# Patient Record
Sex: Female | Born: 1970 | Race: White | Hispanic: No | Smoking: Former smoker
Health system: Southern US, Community
[De-identification: ages and names within clinical notes are randomized; demographics above are authoritative.]

## PROBLEM LIST (undated history)

## (undated) DIAGNOSIS — C3491 Malignant neoplasm of unspecified part of right bronchus or lung: Secondary | ICD-10-CM

## (undated) DIAGNOSIS — Z923 Personal history of irradiation: Secondary | ICD-10-CM

## (undated) HISTORY — PX: FEMUR CLOSED REDUCTION: SHX939

---

## 2006-06-30 ENCOUNTER — Emergency Department (HOSPITAL_COMMUNITY): Admission: EM | Admit: 2006-06-30 | Discharge: 2006-06-30 | Payer: Self-pay | Admitting: Emergency Medicine

## 2006-07-07 ENCOUNTER — Ambulatory Visit: Payer: Self-pay | Admitting: Obstetrics & Gynecology

## 2014-06-28 ENCOUNTER — Emergency Department (HOSPITAL_COMMUNITY): Payer: Medicaid Other

## 2014-06-28 ENCOUNTER — Encounter (HOSPITAL_COMMUNITY): Payer: Self-pay

## 2014-06-28 ENCOUNTER — Other Ambulatory Visit (HOSPITAL_COMMUNITY): Payer: Self-pay

## 2014-06-28 ENCOUNTER — Inpatient Hospital Stay (HOSPITAL_COMMUNITY)
Admission: EM | Admit: 2014-06-28 | Discharge: 2014-07-03 | DRG: 180 | Disposition: A | Discharge status: Home / Self Care | Payer: Medicaid Other | Attending: Internal Medicine | Admitting: Internal Medicine

## 2014-06-28 DIAGNOSIS — Z8249 Family history of ischemic heart disease and other diseases of the circulatory system: Secondary | ICD-10-CM

## 2014-06-28 DIAGNOSIS — J95811 Postprocedural pneumothorax: Secondary | ICD-10-CM

## 2014-06-28 DIAGNOSIS — C3411 Malignant neoplasm of upper lobe, right bronchus or lung: Secondary | ICD-10-CM | POA: Diagnosis present

## 2014-06-28 DIAGNOSIS — R918 Other nonspecific abnormal finding of lung field: Secondary | ICD-10-CM

## 2014-06-28 DIAGNOSIS — R0789 Other chest pain: Secondary | ICD-10-CM

## 2014-06-28 DIAGNOSIS — R042 Hemoptysis: Secondary | ICD-10-CM | POA: Diagnosis present

## 2014-06-28 DIAGNOSIS — R0602 Shortness of breath: Secondary | ICD-10-CM

## 2014-06-28 DIAGNOSIS — R071 Chest pain on breathing: Secondary | ICD-10-CM | POA: Diagnosis not present

## 2014-06-28 DIAGNOSIS — F17210 Nicotine dependence, cigarettes, uncomplicated: Secondary | ICD-10-CM | POA: Diagnosis present

## 2014-06-28 DIAGNOSIS — Z720 Tobacco use: Secondary | ICD-10-CM | POA: Diagnosis present

## 2014-06-28 DIAGNOSIS — Z7982 Long term (current) use of aspirin: Secondary | ICD-10-CM | POA: Diagnosis not present

## 2014-06-28 DIAGNOSIS — D649 Anemia, unspecified: Secondary | ICD-10-CM | POA: Diagnosis not present

## 2014-06-28 DIAGNOSIS — J181 Lobar pneumonia, unspecified organism: Secondary | ICD-10-CM | POA: Diagnosis not present

## 2014-06-28 DIAGNOSIS — D75839 Thrombocytosis, unspecified: Secondary | ICD-10-CM | POA: Diagnosis present

## 2014-06-28 DIAGNOSIS — R61 Generalized hyperhidrosis: Secondary | ICD-10-CM | POA: Diagnosis present

## 2014-06-28 DIAGNOSIS — J189 Pneumonia, unspecified organism: Secondary | ICD-10-CM | POA: Insufficient documentation

## 2014-06-28 DIAGNOSIS — D473 Essential (hemorrhagic) thrombocythemia: Secondary | ICD-10-CM | POA: Diagnosis present

## 2014-06-28 DIAGNOSIS — R079 Chest pain, unspecified: Secondary | ICD-10-CM

## 2014-06-28 DIAGNOSIS — D72829 Elevated white blood cell count, unspecified: Secondary | ICD-10-CM | POA: Diagnosis present

## 2014-06-28 DIAGNOSIS — D509 Iron deficiency anemia, unspecified: Secondary | ICD-10-CM | POA: Diagnosis present

## 2014-06-28 LAB — CREATININE, SERUM: Creatinine, Ser: 0.58 mg/dL (ref 0.50–1.10)

## 2014-06-28 LAB — URINALYSIS, ROUTINE W REFLEX MICROSCOPIC
Bilirubin Urine: NEGATIVE
Glucose, UA: NEGATIVE mg/dL
Hgb urine dipstick: NEGATIVE
Ketones, ur: NEGATIVE mg/dL
LEUKOCYTES UA: NEGATIVE
NITRITE: NEGATIVE
Protein, ur: NEGATIVE mg/dL
SPECIFIC GRAVITY, URINE: 1.034 — AB (ref 1.005–1.030)
UROBILINOGEN UA: 1 mg/dL (ref 0.0–1.0)
pH: 7.5 (ref 5.0–8.0)

## 2014-06-28 LAB — BASIC METABOLIC PANEL
Anion gap: 9 (ref 5–15)
BUN: 9 mg/dL (ref 6–23)
CALCIUM: 8.7 mg/dL (ref 8.4–10.5)
CO2: 24 mmol/L (ref 19–32)
CREATININE: 0.55 mg/dL (ref 0.50–1.10)
Chloride: 100 mmol/L (ref 96–112)
GFR calc non Af Amer: >90 mL/min (ref >90)
GLUCOSE: 94 mg/dL (ref 70–99)
Potassium: 3.9 mmol/L (ref 3.5–5.1)
Sodium: 133 mmol/L — ABNORMAL LOW (ref 135–145)

## 2014-06-28 LAB — I-STAT TROPONIN, ED: Troponin i, poc: 0 ng/mL (ref 0.00–0.08)

## 2014-06-28 LAB — CBC WITH DIFFERENTIAL/PLATELET
Basophils Absolute: 0 10*3/uL (ref 0.0–0.1)
Basophils Relative: 0 % (ref 0–1)
EOS ABS: 0.5 10*3/uL (ref 0.0–0.7)
Eosinophils Relative: 5 % (ref 0–5)
HCT: 35.1 % — ABNORMAL LOW (ref 36.0–46.0)
Hemoglobin: 10.7 g/dL — ABNORMAL LOW (ref 12.0–15.0)
LYMPHS ABS: 1.9 10*3/uL (ref 0.7–4.0)
LYMPHS PCT: 17 % (ref 12–46)
MCH: 25 pg — AB (ref 26.0–34.0)
MCHC: 30.5 g/dL (ref 30.0–36.0)
MCV: 82 fL (ref 78.0–100.0)
MONOS PCT: 9 % (ref 3–12)
Monocytes Absolute: 1 10*3/uL (ref 0.1–1.0)
Neutro Abs: 7.8 10*3/uL — ABNORMAL HIGH (ref 1.7–7.7)
Neutrophils Relative %: 69 % (ref 43–77)
PLATELETS: 559 10*3/uL — AB (ref 150–400)
RBC: 4.28 MIL/uL (ref 3.87–5.11)
RDW: 15.1 % (ref 11.5–15.5)
WBC: 11.3 10*3/uL — AB (ref 4.0–10.5)

## 2014-06-28 LAB — CBC
HEMATOCRIT: 29.3 % — AB (ref 36.0–46.0)
Hemoglobin: 9.2 g/dL — ABNORMAL LOW (ref 12.0–15.0)
MCH: 25.3 pg — AB (ref 26.0–34.0)
MCHC: 31.4 g/dL (ref 30.0–36.0)
MCV: 80.5 fL (ref 78.0–100.0)
Platelets: 415 10*3/uL — ABNORMAL HIGH (ref 150–400)
RBC: 3.64 MIL/uL — ABNORMAL LOW (ref 3.87–5.11)
RDW: 14.9 % (ref 11.5–15.5)
WBC: 10.5 10*3/uL (ref 4.0–10.5)

## 2014-06-28 LAB — HEPATIC FUNCTION PANEL
ALBUMIN: 3.1 g/dL — AB (ref 3.5–5.2)
ALK PHOS: 57 U/L (ref 39–117)
ALT: 11 U/L (ref 0–35)
AST: 19 U/L (ref 0–37)
Bilirubin, Direct: <0.1 mg/dL (ref 0.0–0.5)
TOTAL PROTEIN: 7.4 g/dL (ref 6.0–8.3)
Total Bilirubin: 0.4 mg/dL (ref 0.3–1.2)

## 2014-06-28 LAB — PROTIME-INR
INR: 1.14 (ref 0.00–1.49)
INR: 1.15 (ref 0.00–1.49)
PROTHROMBIN TIME: 14.8 s (ref 11.6–15.2)
Prothrombin Time: 14.8 seconds (ref 11.6–15.2)

## 2014-06-28 LAB — PROCALCITONIN: Procalcitonin: <0.1 ng/mL

## 2014-06-28 LAB — STREP PNEUMONIAE URINARY ANTIGEN: Strep Pneumo Urinary Antigen: NEGATIVE

## 2014-06-28 LAB — MAGNESIUM: MAGNESIUM: 2.1 mg/dL (ref 1.5–2.5)

## 2014-06-28 LAB — LACTIC ACID, PLASMA: Lactic Acid, Venous: 1 mmol/L (ref 0.5–2.0)

## 2014-06-28 MED ORDER — NICOTINE 21 MG/24HR TD PT24
21.0000 mg | MEDICATED_PATCH | Freq: Every day | TRANSDERMAL | Status: DC
  Administered 2014-06-28: 21 mg via TRANSDERMAL
  Filled 2014-06-28 (×6): qty 1

## 2014-06-28 MED ORDER — GI COCKTAIL ~~LOC~~
30.0000 mL | Freq: Three times a day (TID) | ORAL | Status: DC | PRN
  Filled 2014-06-28: qty 30

## 2014-06-28 MED ORDER — DM-GUAIFENESIN ER 30-600 MG PO TB12
2.0000 | ORAL_TABLET | Freq: Two times a day (BID) | ORAL | Status: DC
  Administered 2014-06-28: 1 via ORAL
  Administered 2014-06-29 – 2014-07-03 (×9): 2 via ORAL
  Filled 2014-06-28 (×11): qty 2

## 2014-06-28 MED ORDER — DOCUSATE SODIUM 100 MG PO CAPS
100.0000 mg | ORAL_CAPSULE | Freq: Two times a day (BID) | ORAL | Status: DC
  Administered 2014-06-28 – 2014-07-03 (×10): 100 mg via ORAL
  Filled 2014-06-28 (×11): qty 1

## 2014-06-28 MED ORDER — ONDANSETRON HCL 4 MG/2ML IJ SOLN: 4.0000 mg | Freq: Four times a day (QID) | INTRAMUSCULAR | Status: DC | PRN

## 2014-06-28 MED ORDER — IPRATROPIUM-ALBUTEROL 0.5-2.5 (3) MG/3ML IN SOLN
3.0000 mL | Freq: Four times a day (QID) | RESPIRATORY_TRACT | Status: DC
  Administered 2014-06-28 – 2014-06-29 (×3): 3 mL via RESPIRATORY_TRACT
  Filled 2014-06-28 (×2): qty 3

## 2014-06-28 MED ORDER — MAGNESIUM CITRATE PO SOLN: 1.0000 | Freq: Once | ORAL | Status: AC | PRN

## 2014-06-28 MED ORDER — SODIUM CHLORIDE 0.9 % IV SOLN
INTRAVENOUS | Status: AC
  Administered 2014-06-28 (×2): via INTRAVENOUS

## 2014-06-28 MED ORDER — MORPHINE SULFATE 4 MG/ML IJ SOLN
4.0000 mg | Freq: Once | INTRAMUSCULAR | Status: AC
  Administered 2014-06-28: 4 mg via INTRAVENOUS
  Filled 2014-06-28: qty 1

## 2014-06-28 MED ORDER — IOHEXOL 350 MG/ML SOLN
100.0000 mL | Freq: Once | INTRAVENOUS | Status: AC | PRN
  Administered 2014-06-28: 100 mL via INTRAVENOUS

## 2014-06-28 MED ORDER — ACETAMINOPHEN 325 MG PO TABS
650.0000 mg | ORAL_TABLET | ORAL | Status: DC | PRN
  Administered 2014-06-29: 650 mg via ORAL
  Filled 2014-06-28: qty 2

## 2014-06-28 MED ORDER — IPRATROPIUM-ALBUTEROL 0.5-2.5 (3) MG/3ML IN SOLN
3.0000 mL | RESPIRATORY_TRACT | Status: DC
  Administered 2014-06-28: 3 mL via RESPIRATORY_TRACT
  Filled 2014-06-28: qty 3

## 2014-06-28 MED ORDER — IPRATROPIUM-ALBUTEROL 0.5-2.5 (3) MG/3ML IN SOLN: 3.0000 mL | RESPIRATORY_TRACT | Status: DC | PRN

## 2014-06-28 MED ORDER — MORPHINE SULFATE 2 MG/ML IJ SOLN: 2.0000 mg | INTRAMUSCULAR | Status: DC | PRN

## 2014-06-28 MED ORDER — POLYETHYLENE GLYCOL 3350 17 G PO PACK: 17.0000 g | PACK | Freq: Every day | ORAL | Status: DC | PRN

## 2014-06-28 MED ORDER — OXYCODONE-ACETAMINOPHEN 5-325 MG PO TABS
1.0000 | ORAL_TABLET | ORAL | Status: DC | PRN
  Administered 2014-06-28 (×2): 1 via ORAL
  Administered 2014-06-29: 2 via ORAL
  Administered 2014-06-29: 1 via ORAL
  Administered 2014-06-29 – 2014-06-30 (×4): 2 via ORAL
  Administered 2014-06-30: 1 via ORAL
  Administered 2014-06-30 – 2014-07-03 (×9): 2 via ORAL
  Filled 2014-06-28: qty 1
  Filled 2014-06-28 (×10): qty 2
  Filled 2014-06-28: qty 1
  Filled 2014-06-28 (×2): qty 2
  Filled 2014-06-28: qty 1
  Filled 2014-06-28: qty 2
  Filled 2014-06-28: qty 1
  Filled 2014-06-28: qty 2

## 2014-06-28 MED ORDER — PANTOPRAZOLE SODIUM 40 MG PO TBEC
40.0000 mg | DELAYED_RELEASE_TABLET | Freq: Every day | ORAL | Status: DC
  Administered 2014-06-29 – 2014-07-03 (×5): 40 mg via ORAL
  Filled 2014-06-28 (×6): qty 1

## 2014-06-28 MED ORDER — SORBITOL 70 % SOLN
30.0000 mL | Freq: Every day | Status: DC | PRN
  Filled 2014-06-28: qty 30

## 2014-06-28 MED ORDER — LEVOFLOXACIN IN D5W 750 MG/150ML IV SOLN
750.0000 mg | INTRAVENOUS | Status: DC
  Administered 2014-06-28: 750 mg via INTRAVENOUS
  Filled 2014-06-28: qty 150

## 2014-06-28 MED ORDER — ENOXAPARIN SODIUM 40 MG/0.4ML ~~LOC~~ SOLN
40.0000 mg | SUBCUTANEOUS | Status: DC
  Administered 2014-06-28: 40 mg via SUBCUTANEOUS
  Filled 2014-06-28 (×2): qty 0.4

## 2014-06-28 NOTE — ED Notes (Signed)
Unable to give report, RN unavailable.

## 2014-06-28 NOTE — Progress Notes (Signed)
Received report from ED RN, Pt arrived unit , alert and oriented, able to communicate needs. MD notified of pt's location, will continue with current plan of care.

## 2014-06-28 NOTE — ED Notes (Addendum)
Patient reports right-sided chest pain x 2 weeks, worse overnight.  She says she occasionally coughs up blood.

## 2014-06-28 NOTE — H&P (Signed)
[facility] History and Physical  [name] WCB:[id number] DOB: 12-Apr-1971 DOA: 06/28/2014  Referring physician: Dr. Dan Europe PCP: [name], MD   Chief Complaint: SOB  HPI: Barbara Frost is a 44 y.o. female  With history of extensive tobacco use with 30-pack-year history otherwise previously healthy female who presented to the ED with complaints of right chest pain sharp in nature radiating to the right upper back worse with inspiration and laying flat, worsening shortness of breath, nonproductive cough with hemoptysis one week prior to admission. Patient endorses some wheezing, fever with a temperature 101, chills, diaphoresis, weakness, generalized fatigue. Patient denies any nausea does endorse an episode of emesis after coughing. Patient endorses decreased oral intake. Patient denies any melena, no hematemesis, no hematochezia, no constipation, no diarrhea, no dysuria, no abdominal pain. Patient does endorse some diaphoresis and it 3-5 pound weight loss over the past 3 weeks. Patient denies any lower extremity swelling no recent surgeries, no recent long car rides, no history of prior DVT. Patient states recently completed a 6 day course of Z-Pak which was prescribed to her per her PCP. Patient was seen in emergency room, basic metabolic profile done had a sodium of 133 otherwise was within normal limits. Point-of-care troponin was negative. CBC had a white count of 11.3 hemoglobin of 10.7 platelet count of 559 otherwise was within normal limits. EKG done showed normal sinus rhythm with low voltage. Chest x-ray showed complete opacification of the right upper lobe which may reflect pneumonia with partial collapse. CT of the chest, was negative for PE. Showed obstruction of the right upper lobe bronchus with extensive consolidation. Suspect mass with postobstructive pneumonitis. 4 mm nodular opacity anterior segment of the right lower lobe. Underlying emphysematous change, hepatic  steatosis. [facility] were called to admit the patient for further evaluation and management.   Review of Systems: As per history of present illness otherwise negative. Constitutional:  No weight loss, night sweats, Fevers, chills, fatigue.  HEENT:  No headaches, Difficulty swallowing,Tooth/dental problems,Sore throat,  No sneezing, itching, ear ache, nasal congestion, post nasal drip,  Cardio-vascular:  No chest pain, Orthopnea, PND, swelling in lower extremities, anasarca, dizziness, palpitations  GI:  No heartburn, indigestion, abdominal pain, nausea, vomiting, diarrhea, change in bowel habits, loss of appetite  Resp:  No shortness of breath with exertion or at rest. No excess mucus, no productive cough, No non-productive cough, No coughing up of blood.No change in color of mucus.No wheezing.No chest wall deformity  Skin:  no rash or lesions.  GU:  no dysuria, change in color of urine, no urgency or frequency. No flank pain.  Musculoskeletal:  No joint pain or swelling. No decreased range of motion. No back pain.  Psych:  No change in mood or affect. No depression or anxiety. No memory loss.   History reviewed. No pertinent past medical history. Past Surgical History  Procedure Laterality Date  . Femur closed reduction     Social History:  reports that she has been smoking.  She does not have any smokeless tobacco history on file. She reports that she does not drink alcohol or use illicit drugs.  No Known Allergies  Family History  Problem Relation Age of Onset  . CAD Mother   . CAD Father    father deceased age 55s from an acute MI. Mother alive age 59 coronary artery disease.  Prior to Admission medications   Medication Sig Start Date End Date Taking? Authorizing Provider  aspirin 325 MG EC tablet Take 325  mg by mouth every 6 (six) hours as needed for pain.   Yes Historical Provider, MD  dextromethorphan (DELSYM) 30 MG/5ML liquid Take 30 mg by mouth as needed  for cough.   Yes Historical Provider, MD  dextromethorphan-guaiFENesin (MUCINEX DM) 30-600 MG per 12 hr tablet Take 1 tablet by mouth 2 (two) times daily.   Yes Historical Provider, MD  guaiFENesin (ROBITUSSIN) 100 MG/5ML liquid Take 200 mg by mouth 3 (three) times daily as needed for cough.   Yes Historical Provider, MD  vitamin C (ASCORBIC ACID) 500 MG tablet Take 500 mg by mouth daily.   Yes Historical Provider, MD   Physical Exam: Filed Vitals:   06/28/14 0505 06/28/14 0630 06/28/14 0914  BP: 126/74 121/78 107/55  Pulse: 86 84 70  Temp: 98.6 F (37 C)  98.3 F (36.8 C)  TempSrc: Oral  Oral  Resp: 16 14   SpO2: 100% 100% 100%    Wt Readings from Last 3 Encounters:  No data found for Wt    General:  Thin female in no acute cardiopulmonary distress. Speaking in full sentences.  Eyes: PERRLA, EOMI, normal lids, irises & conjunctiva ENT: grossly normal hearing, lips & tongue. Neck: no LAD, masses or thyromegaly Cardiovascular: RRR, no m/r/g. No LE edema. Telemetry: Sinus rhythm Respiratory: CTA bilaterally, decreased breath sounds in the right middle and right upper lobes. No crackles. No rhonchi. Normal respiratory effort. Abdomen: soft, ntnd, positive bowel sounds, no rebound, no guarding Skin: no rash or induration seen on limited exam Musculoskeletal: grossly normal tone BUE/BLE Psychiatric: grossly normal mood and affect, speech fluent and appropriate Neurologic: Alert and oriented 3. Cranial nerves II through XII are grossly intact. No focal deficits.           Labs on Admission:  Basic Metabolic Panel:  Recent Labs Lab 06/28/14 0543  NA 133*  K 3.9  CL 100  CO2 24  GLUCOSE 94  BUN 9  CREATININE 0.55  CALCIUM 8.7   Liver Function Tests: No results for input(s): AST, ALT, ALKPHOS, BILITOT, PROT, ALBUMIN in the last 168 hours. No results for input(s): LIPASE, AMYLASE in the last 168 hours. No results for input(s): AMMONIA in the last 168  hours. CBC:  Recent Labs Lab 06/28/14 0543  WBC 11.3*  NEUTROABS 7.8*  HGB 10.7*  HCT 35.1*  MCV 82.0  PLT 559*   Cardiac Enzymes: No results for input(s): CKTOTAL, CKMB, CKMBINDEX, TROPONINI in the last 168 hours.  BNP (last 3 results) No results for input(s): BNP in the last 8760 hours.  ProBNP (last 3 results) No results for input(s): PROBNP in the last 8760 hours.  CBG: No results for input(s): GLUCAP in the last 168 hours.  Radiological Exams on Admission: Dg Chest 2 View  06/28/2014   CLINICAL DATA:  Cough for 3 weeks. Chest pain. Shortness of breath.  EXAM: CHEST  2 VIEW  COMPARISON:  None.  FINDINGS: Complete opacification of the right upper lobe with questionable partial collapse. Right lower lung zone is clear. The left lung is clear. The heart size is normal. There is no pleural effusion. No pneumothorax. No acute osseous abnormalities are seen.  IMPRESSION: Complete opacification of the right upper lobe. This may reflect pneumonia with partial collapse. Chest CT with contrast recommended for further evaluation to evaluate for/exclude central obstructing lesion.   Electronically Signed   By: Jeb Levering M.D.   On: 06/28/2014 05:57   Ct Angio Chest Pe W/cm &/or Wo Cm  06/28/2014   CLINICAL DATA:  Shortness of breath and wheezing; hemoptysis  EXAM: CT ANGIOGRAPHY CHEST WITH CONTRAST  TECHNIQUE: Multidetector CT imaging of the chest was performed using the standard protocol during bolus administration of intravenous contrast. Multiplanar CT image reconstructions and MIPs were obtained to evaluate the vascular anatomy.  CONTRAST:  144mL OMNIPAQUE IOHEXOL 350 MG/ML SOLN  COMPARISON:  Chest radiograph June 28, 2014  FINDINGS: There is no demonstrable pulmonary embolus. There is no thoracic aortic aneurysm or dissection.  There is obstruction of the right upper lobe bronchus. There is consolidation throughout most of the right upper lobe.  There is underlying emphysematous  change with scattered small bullae throughout the aerated lungs. On axial slice 63 series 7, there is a 4 mm nodular opacity in the anterior segment of the right lower lobe. There is no appreciable thoracic adenopathy. The pericardium is not thickened.  In the visualized upper abdomen, there is hepatic steatosis.  There are no blastic or lytic bone lesions. There is a 5 mm nodular opacity in the anterior aspect of the right lobe of the thyroid.  Review of the MIP images confirms the above findings.  IMPRESSION: No demonstrable pulmonary embolus.  Obstruction of the right upper lobe bronchus with extensive consolidation. Suspect mass with postobstructive pneumonitis. It is not possible to separate mass from consolidation in the right upper lobe. This finding may well warrant bronchoscopy to further assess.  4 mm nodular opacity anterior segment right lower lobe. This finding could represent a small metastasis.  Underlying emphysematous change.  No adenopathy.  Hepatic steatosis.   Electronically Signed   By: Lowella Grip III M.D.   On: 06/28/2014 07:57    EKG: Independently reviewed. Normal sinus rhythm with low voltage.  Assessment/Plan Principal Problem:   Pulmonary mass Active Problems:   Lobar pneumonia: CAP   Leukocytosis   Anemia   Thrombocytosis   Tobacco abuse   Chest pain on respiration   #1 pulmonary mass/complete right upper lobe opacification with probable postobstructive pneumonitis Will admit the patient to a MedSurg floor. Patient presented with shortness of breath, right-sided chest pain, hemoptysis, 3-5 pound weight loss over the past 3 weeks with a 30-pack-year tobacco history. Chest x-ray CT scan concerning for a pulmonary mass.  Will check a urine Legionella antigen. Urine pneumococcus antigen. Check blood cultures 2. Place on oxygen, Mucinex, empiric IV Levaquin, scheduled nebulizers, pulmonary toilet, flutter valve. Will consult with pulmonary for further evaluation and  management for possible bronchoscopy. Follow.  #2 postobstructive pneumonitis/lobar pneumonia Per chest x-ray and CT scan. Patient with shortness of breath, hemoptysis, nonproductive cough, fever and chills noted to also have a leukocytosis on admission with an extensive tobacco history. Will check a urine Legionella antigen. Check a urine pneumococcus antigen. Check blood cultures 2. Check a sputum Gram stain and culture. Place on oxygen, IV Levaquin, Mucinex, scheduled nebulizers, flutter valve, incentive spirometry. Follow.  #3 leukocytosis Likely secondary to follows #1 and 2. Will check a UA with cultures and sensitivities. Check blood cultures 2. Placed empirically on IV Levaquin.  #4 anemia Check an anemia panel. Follow H&H. No overt bleeding.  #5 tobacco abuse Tobacco cessation. Place on a nicotine patch.  #6 chest pain on respiration Likely secondary to problem #1. EKG with normal sinus rhythm with low voltage. Point-of-care troponin is negative 1. Pain management. Follow.  #7 thrombocytosis Likely secondary to problems #1 and 2. Follow.  #8 prophylaxis Protonix for GI prophylaxis. Lovenox for DVT prophylaxis.  Code Status: Full DVT Prophylaxis: Lovenox Family Communication: Updated patient and boyfriend/fianc at bedside. Disposition Plan: Admit to medsurg  Time spent: 7mins  [facility] MD [facility] Pager [phone]

## 2014-06-28 NOTE — ED Provider Notes (Signed)
CSN: [id number]     Arrival date & time 06/28/14  0457 History   First MD Initiated Contact with Patient 06/28/14 [phone]     Chief Complaint  Patient presents with  . Chest Pain     (Consider location/radiation/quality/duration/timing/severity/associated sxs/prior Treatment) HPI Comments: Barbara Frost is a 44 y.o. Previously healthy female, who presents to the ED with complaints of 3 weeks of right-sided chest pain that began gradually and has persisted, reporting that it became worse last night. She reports 6/10 constant sharp pain that radiates to the right upper back, worse with inspiration and laying flat, improved somewhat with hydrocodone, and associated with cough with hemoptysis, wheezing, and shortness of breath. Additional symptoms include intermittent numbness of the right arm currently resolved, and 3 pillow orthopnea (typically uses 1 pillow but needs 3 now to be comfortable in bed). She denies any fevers, chills, leg swelling, recent travel/surgery/immobilization, estrogen use, personal/family history of DVT/PE, abdominal pain, nausea, vomiting, diarrhea, constipation, dysuria, hematuria or vaginal bleeding or discharge, tingling, weakness, claudication, PND, or rashes. Smokes 1/2 ppd. +FHx of MI in mother (whom has 9 stents placed). PCP- Delena Bali  Patient is a 44 y.o. female presenting with chest pain. The history is provided by the patient. No language interpreter was used.  Chest Pain Pain location:  R chest Pain quality: sharp   Pain radiates to:  Upper back Pain radiates to the back: yes   Pain severity:  Moderate Onset quality:  Gradual Duration:  2 weeks Timing:  Constant Progression:  Worsening Chronicity:  New Context: breathing   Relieved by: hydrocodone. Worsened by:  Deep breathing and certain positions Ineffective treatments:  None tried Associated symptoms: back pain (radiating from chest), cough (with hemoptysis), numbness (intermittent R arm, now  resolved), orthopnea and shortness of breath   Associated symptoms: no abdominal pain, no altered mental status, no anxiety, no claudication, no diaphoresis, no dizziness, no dysphagia, no fever, no heartburn, no lower extremity edema, no nausea, no near-syncope, no palpitations, no PND, no syncope, not vomiting and no weakness   Risk factors: smoking   Risk factors: no birth control, no coronary artery disease, no diabetes mellitus, no high cholesterol, no hypertension, no immobilization, not obese, no prior DVT/PE and no surgery     History reviewed. No pertinent past medical history. Past Surgical History  Procedure Laterality Date  . Femur closed reduction     History reviewed. No pertinent family history. History  Substance Use Topics  . Smoking status: Current Every Day Smoker -- 0.50 packs/day  . Smokeless tobacco: Not on file  . Alcohol Use: No   OB History    No data available     Review of Systems  Constitutional: Negative for fever, chills and diaphoresis.  HENT: Negative for trouble swallowing.   Respiratory: Positive for cough (with hemoptysis), shortness of breath and wheezing.   Cardiovascular: Positive for chest pain and orthopnea. Negative for palpitations, claudication, leg swelling, syncope, PND and near-syncope.  Gastrointestinal: Negative for heartburn, nausea, vomiting, abdominal pain, diarrhea and constipation.  Genitourinary: Negative for dysuria, hematuria, vaginal bleeding and vaginal discharge.  Musculoskeletal: Positive for back pain (radiating from chest). Negative for myalgias, arthralgias and neck pain.  Skin: Negative for color change.  Allergic/Immunologic: Negative for immunocompromised state.  Neurological: Positive for numbness (intermittent R arm, now resolved). Negative for dizziness, syncope, weakness and light-headedness.  Hematological: Does not bruise/bleed easily.  Psychiatric/Behavioral: Negative for confusion.   10 Systems reviewed and  are negative for  acute change except as noted in the HPI.    Allergies  Review of patient's allergies indicates no known allergies.  Home Medications   Prior to Admission medications   Medication Sig Start Date End Date Taking? Authorizing Provider  aspirin 325 MG EC tablet Take 325 mg by mouth every 6 (six) hours as needed for pain.   Yes Historical Provider, MD  dextromethorphan (DELSYM) 30 MG/5ML liquid Take 30 mg by mouth as needed for cough.   Yes Historical Provider, MD  dextromethorphan-guaiFENesin (MUCINEX DM) 30-600 MG per 12 hr tablet Take 1 tablet by mouth 2 (two) times daily.   Yes Historical Provider, MD  guaiFENesin (ROBITUSSIN) 100 MG/5ML liquid Take 200 mg by mouth 3 (three) times daily as needed for cough.   Yes Historical Provider, MD  vitamin C (ASCORBIC ACID) 500 MG tablet Take 500 mg by mouth daily.   Yes Historical Provider, MD   BP 126/74 mmHg  Pulse 86  Temp(Src) 98.6 F (37 C) (Oral)  Resp 16  SpO2 100%  LMP 06/06/2014 (Exact Date) Physical Exam  Constitutional: She is oriented to person, place, and time. Vital signs are normal. She appears well-developed and well-nourished.  Non-toxic appearance. No distress.  Afebrile, nontoxic, NAD, VSS  HENT:  Head: Normocephalic and atraumatic.  Mouth/Throat: Oropharynx is clear and moist and mucous membranes are normal.  Eyes: Conjunctivae and EOM are normal. Right eye exhibits no discharge. Left eye exhibits no discharge.  Neck: Normal range of motion. Neck supple.  Cardiovascular: Normal rate, regular rhythm, normal heart sounds and intact distal pulses.  Exam reveals no gallop and no friction rub.   No murmur heard. RRR, nl s1/s2, no m/r/g, distal pulses intact, no pedal edema   Pulmonary/Chest: Effort normal. No respiratory distress. She has decreased breath sounds in the right upper field and the right middle field. She has no wheezes. She has no rhonchi. She has no rales. She exhibits no tenderness, no crepitus  and no retraction.  Diminished breath sounds in RUL/RML, no wheezes, rhonchi, or rales, no hypoxia or increased WOB, speaking in full sentences, SpO2 100% on RA Chest wall nonTTP, no retraction or crepitus, symmetric chest expansion  Abdominal: Soft. Normal appearance and bowel sounds are normal. She exhibits no distension. There is no tenderness. There is no rigidity, no rebound, no guarding, no CVA tenderness, no tenderness at McBurney's point and negative Murphy's sign.  Musculoskeletal: Normal range of motion.  MAE x4 Strength 5/5 in all extremities Distal pulses intact in all extremities No pedal edema, neg homan's sign  Neurological: She is alert and oriented to person, place, and time. She has normal strength. No sensory deficit.  Skin: Skin is warm, dry and intact. No rash noted.  Psychiatric: She has a normal mood and affect.  Nursing note and vitals reviewed.   ED Course  Procedures (including critical care time) Labs Review Labs Reviewed  CBC WITH DIFFERENTIAL/PLATELET - Abnormal; Notable for the following:    WBC 11.3 (*)    Hemoglobin 10.7 (*)    HCT 35.1 (*)    MCH 25.0 (*)    Platelets 559 (*)    Neutro Abs 7.8 (*)    All other components within normal limits  BASIC METABOLIC PANEL - Abnormal; Notable for the following:    Sodium 133 (*)    All other components within normal limits  I-STAT TROPOININ, ED    Imaging Review Dg Chest 2 View  06/28/2014   CLINICAL DATA:  Cough  for 3 weeks. Chest pain. Shortness of breath.  EXAM: CHEST  2 VIEW  COMPARISON:  None.  FINDINGS: Complete opacification of the right upper lobe with questionable partial collapse. Right lower lung zone is clear. The left lung is clear. The heart size is normal. There is no pleural effusion. No pneumothorax. No acute osseous abnormalities are seen.  IMPRESSION: Complete opacification of the right upper lobe. This may reflect pneumonia with partial collapse. Chest CT with contrast recommended for  further evaluation to evaluate for/exclude central obstructing lesion.   Electronically Signed   By: Jeb Levering M.D.   On: 06/28/2014 05:57   Ct Angio Chest Pe W/cm &/or Wo Cm  06/28/2014   CLINICAL DATA:  Shortness of breath and wheezing; hemoptysis  EXAM: CT ANGIOGRAPHY CHEST WITH CONTRAST  TECHNIQUE: Multidetector CT imaging of the chest was performed using the standard protocol during bolus administration of intravenous contrast. Multiplanar CT image reconstructions and MIPs were obtained to evaluate the vascular anatomy.  CONTRAST:  164mL OMNIPAQUE IOHEXOL 350 MG/ML SOLN  COMPARISON:  Chest radiograph June 28, 2014  FINDINGS: There is no demonstrable pulmonary embolus. There is no thoracic aortic aneurysm or dissection.  There is obstruction of the right upper lobe bronchus. There is consolidation throughout most of the right upper lobe.  There is underlying emphysematous change with scattered small bullae throughout the aerated lungs. On axial slice 63 series 7, there is a 4 mm nodular opacity in the anterior segment of the right lower lobe. There is no appreciable thoracic adenopathy. The pericardium is not thickened.  In the visualized upper abdomen, there is hepatic steatosis.  There are no blastic or lytic bone lesions. There is a 5 mm nodular opacity in the anterior aspect of the right lobe of the thyroid.  Review of the MIP images confirms the above findings.  IMPRESSION: No demonstrable pulmonary embolus.  Obstruction of the right upper lobe bronchus with extensive consolidation. Suspect mass with postobstructive pneumonitis. It is not possible to separate mass from consolidation in the right upper lobe. This finding may well warrant bronchoscopy to further assess.  4 mm nodular opacity anterior segment right lower lobe. This finding could represent a small metastasis.  Underlying emphysematous change.  No adenopathy.  Hepatic steatosis.   Electronically Signed   By: Lowella Grip III  M.D.   On: 06/28/2014 07:57     EKG Interpretation   Date/Time:  Thursday June 28 2014 05:32:15 EST Ventricular Rate:  83 PR Interval:  141 QRS Duration: 77 QT Interval:  356 QTC Calculation: 418 R Axis:   55 Text Interpretation:  Sinus rhythm Low voltage, precordial leads No old  tracing to compare Confirmed by NANAVATI, MD, Thelma Comp [phone]) on 06/28/2014  8:09:50 AM      MDM   Final diagnoses:  Pulmonary mass  Acute pneumonitis  Other chest pain  SOB (shortness of breath)  Thrombocytosis    44 y.o. female with R sided chest pain x3 wks, worse last night. Associated with SOB. CP pleuritic in nature. Exam reveals diminished breath sounds in RUL. EKG NSR with low voltage leads, trop neg, CBC showing leukocytosis with thrombocytosis, BMP with mildly low sodium at 133. CXR showing complete opacification of RUL which could represent PNA with partial collapse, needs CTA for further evaluation. Will give morphine now for pain. VSS at this time, but will monitor closely. DDx includes PE vs cancer vs PNA. Will reassess shortly.   8:08 AM Pain improved after morphine. CTA  showing suspicious lesion that could present mass, with postobstructive pneumonitis. Will consult for admission.   8:31 AM Dr. Irine Seal returning page for [facility], will admit to tele IP bed. Holding orders placed. Please see his H&P for further documentation of care.  BP 121/78 mmHg  Pulse 84  Temp(Src) 98.6 F (37 C) (Oral)  Resp 14  SpO2 100%  LMP 06/06/2014 (Exact Date)  Meds ordered this encounter  Medications  . morphine 4 MG/ML injection 4 mg    Sig:   . iohexol (OMNIPAQUE) 350 MG/ML injection 100 mL    Sig:      [employer], PA-C 06/28/14 0831  Varney Biles, MD 06/29/14 [phone]

## 2014-06-28 NOTE — Progress Notes (Signed)
Referring Physician(s): Dr. Chase Caller  Subjective: 44 yo female with a few weeks hx of intermittent hemoptysis. Presented to ER and workup finds obstructing right upper lung mass. Pulmonary has seen and requests perc biopsy. Images were reviewed with/by Dr. Earleen Newport  PMHx: Tobacco abuse  History   Social History  . Marital Status: Divorced    Spouse Name: N/A  . Number of Children: N/A  . Years of Education: N/A   Occupational History  . Not on file.   Social History Main Topics  . Smoking status: Current Every Day Smoker -- 1.00 packs/day for 30 years    Types: Cigarettes  . Smokeless tobacco: Never Used  . Alcohol Use: No     Comment: socially  . Drug Use: No  . Sexual Activity: Not Currently   Other Topics Concern  . Not on file   Social History Narrative  . No narrative on file    Allergies: Review of patient's allergies indicates no known allergies.  Medications: Prior to Admission medications   Medication Sig Start Date End Date Taking? Authorizing Provider  aspirin 325 MG EC tablet Take 325 mg by mouth every 6 (six) hours as needed for pain.   Yes Historical Provider, MD  dextromethorphan (DELSYM) 30 MG/5ML liquid Take 30 mg by mouth as needed for cough.   Yes Historical Provider, MD  dextromethorphan-guaiFENesin (MUCINEX DM) 30-600 MG per 12 hr tablet Take 1 tablet by mouth 2 (two) times daily.   Yes Historical Provider, MD  guaiFENesin (ROBITUSSIN) 100 MG/5ML liquid Take 200 mg by mouth 3 (three) times daily as needed for cough.   Yes Historical Provider, MD  vitamin C (ASCORBIC ACID) 500 MG tablet Take 500 mg by mouth daily.   Yes Historical Provider, MD    Review of Systems  Vital Signs: BP 107/55 mmHg  Pulse 70  Temp(Src) 98.3 F (36.8 C) (Oral)  Resp 20  SpO2 100%  LMP 06/06/2014 (Exact Date)  Physical Exam Gen: 44 yo white female, NAD. A&O x 3 ENT: unremarkable Lungs: diminished right upper BS, otherwise clear Heart:  Regular    Imaging: Dg Chest 2 View  06/28/2014   CLINICAL DATA:  Cough for 3 weeks. Chest pain. Shortness of breath.  EXAM: CHEST  2 VIEW  COMPARISON:  None.  FINDINGS: Complete opacification of the right upper lobe with questionable partial collapse. Right lower lung zone is clear. The left lung is clear. The heart size is normal. There is no pleural effusion. No pneumothorax. No acute osseous abnormalities are seen.  IMPRESSION: Complete opacification of the right upper lobe. This may reflect pneumonia with partial collapse. Chest CT with contrast recommended for further evaluation to evaluate for/exclude central obstructing lesion.   Electronically Signed   By: Jeb Levering M.D.   On: 06/28/2014 05:57   Ct Angio Chest Pe W/cm &/or Wo Cm  06/28/2014   CLINICAL DATA:  Shortness of breath and wheezing; hemoptysis  EXAM: CT ANGIOGRAPHY CHEST WITH CONTRAST  TECHNIQUE: Multidetector CT imaging of the chest was performed using the standard protocol during bolus administration of intravenous contrast. Multiplanar CT image reconstructions and MIPs were obtained to evaluate the vascular anatomy.  CONTRAST:  156mL OMNIPAQUE IOHEXOL 350 MG/ML SOLN  COMPARISON:  Chest radiograph June 28, 2014  FINDINGS: There is no demonstrable pulmonary embolus. There is no thoracic aortic aneurysm or dissection.  There is obstruction of the right upper lobe bronchus. There is consolidation throughout most of the right upper lobe.  There  is underlying emphysematous change with scattered small bullae throughout the aerated lungs. On axial slice 63 series 7, there is a 4 mm nodular opacity in the anterior segment of the right lower lobe. There is no appreciable thoracic adenopathy. The pericardium is not thickened.  In the visualized upper abdomen, there is hepatic steatosis.  There are no blastic or lytic bone lesions. There is a 5 mm nodular opacity in the anterior aspect of the right lobe of the thyroid.  Review of the MIP  images confirms the above findings.  IMPRESSION: No demonstrable pulmonary embolus.  Obstruction of the right upper lobe bronchus with extensive consolidation. Suspect mass with postobstructive pneumonitis. It is not possible to separate mass from consolidation in the right upper lobe. This finding may well warrant bronchoscopy to further assess.  4 mm nodular opacity anterior segment right lower lobe. This finding could represent a small metastasis.  Underlying emphysematous change.  No adenopathy.  Hepatic steatosis.   Electronically Signed   By: Lowella Grip III M.D.   On: 06/28/2014 07:57    Labs:  CBC:  Recent Labs  06/28/14 0543  WBC 11.3*  HGB 10.7*  HCT 35.1*  PLT 559*    COAGS:  Recent Labs  06/28/14 1142  INR 1.14    BMP:  Recent Labs  06/28/14 0543  NA 133*  K 3.9  CL 100  CO2 24  GLUCOSE 94  BUN 9  CALCIUM 8.7  CREATININE 0.55  GFRNONAA >90  GFRAA >90    LIVER FUNCTION TESTS:  Recent Labs  06/28/14 0914  BILITOT 0.4  AST 19  ALT 11  ALKPHOS 57  PROT 7.4  ALBUMIN 3.1*    Assessment and Plan: RUL obstructing lung mass Request for CT guided perc biopsy, reviewed by Dr. Earleen Newport Explained procedure and risks/complications including hemorrhage/hemoptysis, PTX/chest tube placement, respiratory failure/death. Also risk of obtaining non-diagnostic sample Pt has eaten in ER today, will make NPO after MN Plan for procedure sometime tomorrow.     I spent a total of 20 minutes face to face in clinical consultation/evaluation, greater than 50% of which was counseling/coordinating care for right lung mass biopsy.  SignedAscencion Dike 06/28/2014, 1:34 PM

## 2014-06-28 NOTE — Progress Notes (Signed)
UR completed 

## 2014-06-28 NOTE — Consult Note (Addendum)
PULMONARY / CRITICAL CARE MEDICINE   Name: Barbara Frost MRN: [id number] DOB: [birth year]    ADMISSION DATE:  06/28/2014 CONSULTATION DATE: 06/28/2014    REFERRING MD :  Dr Marina Gravel [facility]  CHIEF COMPLAINT:  RUL mass with hemoptysuis   HISTORY OF PRESENT ILLNESS:   Previously healthy female, smokler presnts with f 3 weeks of right-sided upper chest pain that began gradually and has persisted, THrough this she has had intermittent fevers, chills, and one week ago fever broke but she has had 3 episodes of hemoptysis since then. First wa sa week ago of a larger volume but still only in  [place] and most recently yesteerday. CT eval here shows RUL mass that is large with enobronchial lesion in right main bronchus and PCCM asked to evaluate.   Fiancee at bedside    has no past medical history on file.   reports that she has been smoking Cigarettes.  She has a 30 pack-year smoking history. She has never used smokeless tobacco.  Past Surgical History  Procedure Laterality Date  . Femur closed reduction      No Known Allergies   There is no immunization history on file for this patient.  Family History  Problem Relation Age of Onset  . CAD Mother   . CAD Father      Current facility-administered medications:  .  0.9 %  sodium chloride infusion, , Intravenous, Continuous, Eugenie Filler, MD, Last Rate: 75 mL/hr at 06/28/14 [phone] .  acetaminophen (TYLENOL) tablet 650 mg, 650 mg, Oral, Q4H PRN, Irine Seal V, MD .  levofloxacin (LEVAQUIN) IVPB 750 mg, 750 mg, Intravenous, Q24H, Eugenie Filler, MD, Last Rate: 100 mL/hr at 06/28/14 0952, 750 mg at 06/28/14 0952 .  morphine 2 MG/ML injection 2 mg, 2 mg, Intravenous, Q4H PRN, Irine Seal V, MD .  nicotine (NICODERM CQ - dosed in mg/24 hours) patch 21 mg, 21 mg, Transdermal, Daily, Irine Seal V, MD .  ondansetron [facility]) injection 4 mg, 4 mg, Intravenous, Q6H PRN, Eugenie Filler, MD .  oxyCODONE-acetaminophen  (PERCOCET/ROXICET) 5-325 MG per tablet 1-2 tablet, 1-2 tablet, Oral, Q4H PRN, Eugenie Filler, MD  Current outpatient prescriptions:  .  aspirin 325 MG EC tablet, Take 325 mg by mouth every 6 (six) hours as needed for pain., Disp: , Rfl:  .  dextromethorphan (DELSYM) 30 MG/5ML liquid, Take 30 mg by mouth as needed for cough., Disp: , Rfl:  .  dextromethorphan-guaiFENesin (MUCINEX DM) 30-600 MG per 12 hr tablet, Take 1 tablet by mouth 2 (two) times daily., Disp: , Rfl:  .  guaiFENesin (ROBITUSSIN) 100 MG/5ML liquid, Take 200 mg by mouth 3 (three) times daily as needed for cough., Disp: , Rfl:  .  vitamin C (ASCORBIC ACID) 500 MG tablet, Take 500 mg by mouth daily., Disp: , Rfl:     REVIEW OF SYSTEMS:  Per HPI.      VITAL SIGNS: Temp:  [98.3 F (36.8 C)-98.6 F (37 C)] 98.3 F (36.8 C) (02/25 0914) Pulse Rate:  [70-86] 70 (02/25 0914) Resp:  [14-20] 20 (02/25 0914) BP: (107-126)/(55-78) 107/55 mmHg (02/25 0914) SpO2:  [100 %] 100 % (02/25 0914) HEMODYNAMICS:   VENTILATOR SETTINGS:   INTAKE / OUTPUT: No intake or output data in the 24 hours ending 06/28/14 1135  PHYSICAL EXAMINATION: General:  Looks well Neuro:  Axox3, speech normal, moves all 4s,  HEENT:  No neck nodes Cardiovascular:  Normal heart sounds Lungs:  RUL - air  entry diminished and dull to percussion. No wheeze. Abdomen:  Soft, No mass Musculoskeletal:  No cyanosis, No clubbing. No edema Skin:  Intact  LABS:  PULMONARY No results for input(s): PHART, PCO2ART, PO2ART, HCO3, TCO2, O2SAT in the last 168 hours.  Invalid input(s): PCO2, PO2  CBC  Recent Labs Lab 06/28/14 0543  HGB 10.7*  HCT 35.1*  WBC 11.3*  PLT 559*    COAGULATION No results for input(s): INR in the last 168 hours.  CARDIAC  No results for input(s): TROPONINI in the last 168 hours. No results for input(s): PROBNP in the last 168 hours.   CHEMISTRY  Recent Labs Lab 06/28/14 0543  NA 133*  K 3.9  CL 100  CO2 24   GLUCOSE 94  BUN 9  CREATININE 0.55  CALCIUM 8.7   CrCl cannot be calculated (Unknown ideal weight.).   LIVER  Recent Labs Lab 06/28/14 0914  AST 19  ALT 11  ALKPHOS 57  BILITOT 0.4  PROT 7.4  ALBUMIN 3.1*     INFECTIOUS No results for input(s): LATICACIDVEN, PROCALCITON in the last 168 hours.   ENDOCRINE CBG (last 3)  No results for input(s): GLUCAP in the last 72 hours.       IMAGING x48h Dg Chest 2 View  06/28/2014   CLINICAL DATA:  Cough for 3 weeks. Chest pain. Shortness of breath.  EXAM: CHEST  2 VIEW  COMPARISON:  None.  FINDINGS: Complete opacification of the right upper lobe with questionable partial collapse. Right lower lung zone is clear. The left lung is clear. The heart size is normal. There is no pleural effusion. No pneumothorax. No acute osseous abnormalities are seen.  IMPRESSION: Complete opacification of the right upper lobe. This may reflect pneumonia with partial collapse. Chest CT with contrast recommended for further evaluation to evaluate for/exclude central obstructing lesion.   Electronically Signed   By: Jeb Levering M.D.   On: 06/28/2014 05:57   Ct Angio Chest Pe W/cm &/or Wo Cm  06/28/2014   CLINICAL DATA:  Shortness of breath and wheezing; hemoptysis  EXAM: CT ANGIOGRAPHY CHEST WITH CONTRAST  TECHNIQUE: Multidetector CT imaging of the chest was performed using the standard protocol during bolus administration of intravenous contrast. Multiplanar CT image reconstructions and MIPs were obtained to evaluate the vascular anatomy.  CONTRAST:  127mL OMNIPAQUE IOHEXOL 350 MG/ML SOLN  COMPARISON:  Chest radiograph June 28, 2014  FINDINGS: There is no demonstrable pulmonary embolus. There is no thoracic aortic aneurysm or dissection.  There is obstruction of the right upper lobe bronchus. There is consolidation throughout most of the right upper lobe.  There is underlying emphysematous change with scattered small bullae throughout the aerated  lungs. On axial slice 63 series 7, there is a 4 mm nodular opacity in the anterior segment of the right lower lobe. There is no appreciable thoracic adenopathy. The pericardium is not thickened.  In the visualized upper abdomen, there is hepatic steatosis.  There are no blastic or lytic bone lesions. There is a 5 mm nodular opacity in the anterior aspect of the right lobe of the thyroid.  Review of the MIP images confirms the above findings.  IMPRESSION: No demonstrable pulmonary embolus.  Obstruction of the right upper lobe bronchus with extensive consolidation. Suspect mass with postobstructive pneumonitis. It is not possible to separate mass from consolidation in the right upper lobe. This finding may well warrant bronchoscopy to further assess.  4 mm nodular opacity anterior segment right lower lobe.  This finding could represent a small metastasis.  Underlying emphysematous change.  No adenopathy.  Hepatic steatosis.   Electronically Signed   By: Lowella Grip III M.D.   On: 06/28/2014 07:57         ASSESSMENT / PLAN:  PULMONARY  A:SMoker with hemoptysis and large RUL mass - concern is for lung cancer  P:   D/w Dr Lyman Speller - radiologist  And later with Dr Earleen Newport of IR - large RUL mass .Doubt this is is pneumonia. Lung is RUL is pushed to one side.    - Patient needs tissue diagnosis   - Options are   A) bronch with endobronchial biopsy - risks sedation and bleeding risk are main but low overall with risk of non-diagnosis (low - moderate)  OR  B) CT guides TTNCore bx - risk are bleeding mainly (low per Dr Earleen Newport) and higher chance of diagnosis though risk of non0diagnosis exists compared to bronch  - On balance recommend CT guided TTNA Core bx - ordered  - Patient explained of both discussions. SHe is agreeable to either   - Check PCT, - and if normal dc antibioptics  - Alsne rule out MI - trop cycle ordered    D/w Dr Wonda Olds of [facility]       Dr. Brand Males, M.D., [facility].C.P Pulmonary and Critical Care Medicine Staff Physician [place] [facility] Pager: [phone], If no answer or between  15:00h - 7:00h: call [phone]  06/28/2014 12:06 PM

## 2014-06-28 NOTE — ED Notes (Signed)
Pt reports R cp x 3 weeks, unable to tolerate it tonight.  Pt reports it feels like her rib is going through her lung.  Pt is a smoker.  Pt reports SOB with the pain.

## 2014-06-29 ENCOUNTER — Inpatient Hospital Stay (HOSPITAL_COMMUNITY): Payer: Medicaid Other

## 2014-06-29 ENCOUNTER — Encounter (HOSPITAL_COMMUNITY): Payer: Self-pay | Admitting: Radiology

## 2014-06-29 ENCOUNTER — Encounter (HOSPITAL_COMMUNITY): Payer: Self-pay

## 2014-06-29 ENCOUNTER — Encounter (HOSPITAL_COMMUNITY)
Admission: EM | Disposition: A | Discharge status: Home / Self Care | Payer: Self-pay | Source: Home / Self Care | Attending: Internal Medicine

## 2014-06-29 DIAGNOSIS — C3491 Malignant neoplasm of unspecified part of right bronchus or lung: Secondary | ICD-10-CM

## 2014-06-29 HISTORY — DX: Malignant neoplasm of unspecified part of right bronchus or lung: C34.91

## 2014-06-29 LAB — COMPREHENSIVE METABOLIC PANEL
ALK PHOS: 45 U/L (ref 39–117)
ALT: 11 U/L (ref 0–35)
ANION GAP: 10 (ref 5–15)
AST: 16 U/L (ref 0–37)
Albumin: 2.7 g/dL — ABNORMAL LOW (ref 3.5–5.2)
BUN: 8 mg/dL (ref 6–23)
CHLORIDE: 105 mmol/L (ref 96–112)
CO2: 24 mmol/L (ref 19–32)
Calcium: 8.6 mg/dL (ref 8.4–10.5)
Creatinine, Ser: 0.59 mg/dL (ref 0.50–1.10)
GFR calc non Af Amer: >90 mL/min (ref >90)
GLUCOSE: 92 mg/dL (ref 70–99)
Potassium: 4 mmol/L (ref 3.5–5.1)
Sodium: 139 mmol/L (ref 135–145)
Total Bilirubin: 0.4 mg/dL (ref 0.3–1.2)
Total Protein: 6.6 g/dL (ref 6.0–8.3)

## 2014-06-29 LAB — PROCALCITONIN

## 2014-06-29 LAB — HIV ANTIBODY (ROUTINE TESTING W REFLEX): HIV Screen 4th Generation wRfx: NONREACTIVE

## 2014-06-29 LAB — DRUGS OF ABUSE SCREEN W/O ALC, ROUTINE URINE
Amphetamine Screen, Ur: NEGATIVE
BENZODIAZEPINES.: NEGATIVE
Barbiturate Quant, Ur: NEGATIVE
COCAINE METABOLITES: NEGATIVE
Creatinine,U: 42.6 mg/dL
Marijuana Metabolite: NEGATIVE
Methadone: NEGATIVE
OPIATE SCREEN, URINE: POSITIVE — AB
PHENCYCLIDINE (PCP): NEGATIVE
PROPOXYPHENE: NEGATIVE

## 2014-06-29 LAB — CBC
HCT: 27.9 % — ABNORMAL LOW (ref 36.0–46.0)
Hemoglobin: 8.7 g/dL — ABNORMAL LOW (ref 12.0–15.0)
MCH: 25.2 pg — ABNORMAL LOW (ref 26.0–34.0)
MCHC: 31.2 g/dL (ref 30.0–36.0)
MCV: 80.9 fL (ref 78.0–100.0)
PLATELETS: 407 10*3/uL — AB (ref 150–400)
RBC: 3.45 MIL/uL — AB (ref 3.87–5.11)
RDW: 15.2 % (ref 11.5–15.5)
WBC: 9.2 10*3/uL (ref 4.0–10.5)

## 2014-06-29 LAB — URINE CULTURE
Colony Count: NO GROWTH
Culture: NO GROWTH

## 2014-06-29 LAB — LEGIONELLA ANTIGEN, URINE

## 2014-06-29 LAB — PROTIME-INR
INR: 1.19 (ref 0.00–1.49)
Prothrombin Time: 15.2 seconds (ref 11.6–15.2)

## 2014-06-29 LAB — SURGICAL PCR SCREEN
MRSA, PCR: NEGATIVE
STAPHYLOCOCCUS AUREUS: NEGATIVE

## 2014-06-29 LAB — GLUCOSE, CAPILLARY: Glucose-Capillary: 92 mg/dL (ref 70–99)

## 2014-06-29 SURGERY — VIDEO BRONCHOSCOPY WITHOUT FLUORO
Anesthesia: Moderate Sedation | Laterality: Bilateral

## 2014-06-29 MED ORDER — FENTANYL CITRATE 0.05 MG/ML IJ SOLN
INTRAMUSCULAR | Status: AC | PRN
  Administered 2014-06-29 (×2): 25 ug via INTRAVENOUS

## 2014-06-29 MED ORDER — MIDAZOLAM HCL 2 MG/2ML IJ SOLN
INTRAMUSCULAR | Status: AC
  Filled 2014-06-29: qty 6

## 2014-06-29 MED ORDER — MIDAZOLAM HCL 2 MG/2ML IJ SOLN
INTRAMUSCULAR | Status: AC | PRN
  Administered 2014-06-29 (×3): 1 mg via INTRAVENOUS

## 2014-06-29 MED ORDER — FENTANYL CITRATE 0.05 MG/ML IJ SOLN
INTRAMUSCULAR | Status: AC
  Filled 2014-06-29: qty 4

## 2014-06-29 MED ORDER — SODIUM CHLORIDE 0.9 % IV SOLN
INTRAVENOUS | Status: DC
  Administered 2014-06-29 – 2014-06-30 (×3): via INTRAVENOUS

## 2014-06-29 NOTE — Procedures (Signed)
Interventional Radiology Procedure Note  Procedure: CT guided right lung mass biopsy.    Complications: No immediate Recommendations:  - Ok to shower tomorrow -  - Follow up on path  Signed,  Dulcy Fanny. Earleen Newport, DO

## 2014-06-29 NOTE — Evaluation (Signed)
Occupational Therapy Evaluation Patient Details Name: Barbara Frost MRN: [id number] DOB: [birth year] Today's Date: 06/29/2014    History of Present Illness presented to the ED with complaints of right chest pain sharp in nature radiating to the right upper back worse with inspiration and laying flat, worsening shortness of breath, nonproductive cough with hemoptysis one week prior to admission. Patient endorses some wheezing, fever with a temperature 101, chills diaphoresis, weakness, generalized fatigue     Clinical Impression   Pt at baseline level of function, independent with ADLs and ADL mobility. Pt denies any pain or discomfort. No further acute OT services indicated at this time    Follow Up Recommendations  No OT follow up    Equipment Recommendations  None recommended by OT    Recommendations for Other Services       Precautions / Restrictions Precautions Precautions: None Restrictions Weight Bearing Restrictions: No      Mobility Bed Mobility Overal bed mobility: Independent                Transfers Overall transfer level: Independent Equipment used: None                  Balance Overall balance assessment: No apparent balance deficits (not formally assessed)                                          ADL Overall ADL's : Independent;At baseline                                             Vision  reading glasses   Perception Perception Perception Tested?: No   Praxis Praxis Praxis tested?: Not tested    Pertinent Vitals/Pain Pain Assessment: No/denies pain     Hand Dominance Right   Extremity/Trunk Assessment Upper Extremity Assessment Upper Extremity Assessment: Overall WFL for tasks assessed   Lower Extremity Assessment Lower Extremity Assessment: Defer to PT evaluation   Cervical / Trunk Assessment Cervical / Trunk Assessment: Normal   Communication Communication Communication: No  difficulties   Cognition Arousal/Alertness: Awake/alert Behavior During Therapy: WFL for tasks assessed/performed Overall Cognitive Status: Within Functional Limits for tasks assessed                     General Comments   pt pleasant and cooperative                 Home Living Family/patient expects to be discharged to:: Private residence Living Arrangements: Parent;Spouse/significant other Available Help at Discharge: Family Type of Home: Mobile home Home Access: Stairs to enter Technical brewer of Steps: 4   Home Layout: One level     Bathroom Shower/Tub: Teacher, early years/pre: Standard     Home Equipment: None          Prior Functioning/Environment Level of Independence: Independent             OT Diagnosis: Acute pain;Generalized weakness   OT Problem List: Pain;Decreased activity tolerance   OT Treatment/Interventions:  eval only   OT Goals(Current goals can be found in the care plan section) Acute Rehab OT Goals Patient Stated Goal: go home OT Goal Formulation: With patient/family  OT Frequency:     Barriers to D/C:  none  End of Session    Activity Tolerance: Patient tolerated treatment well Patient left: in bed;with call bell/phone within reach;with nursing/sitter in room;with family/visitor present   Time: 1040-1100 OT Time Calculation (min): 20 min Charges:  OT General Charges $OT Visit: 1 Procedure OT Evaluation $Initial OT Evaluation Tier I: 1 Procedure G-Codes:    Britt Bottom 06/29/2014, 12:12 PM

## 2014-06-29 NOTE — Clinical Documentation Improvement (Signed)
Please specify diagnosis related to below supporting information if appropriate.    Possible Clinical Conditions?  Acute Blood Loss Anemia Aplastic anemia Precipitous drop in Hematocrit Acute on chronic blood loss anemia Other Condition Cannot Clinically Determine   Supporting Information: Per 06/28/14 H&P = active problems listing,  #4 anemia  Check an anemia panel. Follow H&H. No overt bleeding.   Patient's labs this admission  Component     Latest Ref Rng 06/28/2014         5:43 AM  Hemoglobin     12.0 - 15.0 g/dL 10.7 (L)  HCT     36.0 - 46.0 % 35.1 (L)   Component     Latest Ref Rng 06/28/2014 06/29/2014         2:34 PM   Hemoglobin     12.0 - 15.0 g/dL 9.2 (L) 8.7 (L)  HCT     36.0 - 46.0 % 29.3 (L) 27.9 (L)    Thank You, Serena Colonel ,RN Clinical Documentation Specialist:  [place] [employer] [facility]

## 2014-06-29 NOTE — Progress Notes (Signed)
PT Cancellation Note  Patient Details Name: Tomeeka Plaugher MRN: [id number] DOB: 10/09/1970   Cancelled Treatment:    Reason Eval/Treat Not Completed: PT screened, no needs identified, will sign off. OT informed PT that pt would likely not have any needs. Spoke briefly with pt who denied need for PT services. Will sign off. Thanks.    Weston Anna, MPT Pager: [phone]

## 2014-06-29 NOTE — Progress Notes (Addendum)
Case reviewed. PAtient for CT guided TTNA. Noted order for lovenox by PRimary service" dc'ed and placed scd in anticipation of procedure. I updated Dr Wonda Olds of [facility] to update IR. PCCM wil sign off. Call back if patuient needs bronch  Dr. Brand Males, M.D., [facility].C.P Pulmonary and Critical Care Medicine Staff Physician [place] [facility] Pager: [phone], If no answer or between  15:00h - 7:00h: call [phone]  06/29/2014 7:20 AM

## 2014-06-29 NOTE — Progress Notes (Signed)
[facility] PROGRESS NOTE  Barbara Frost WLN:[id number] DOB: 03-Mar-1971 DOA: 06/28/2014 PCP: [name], MD  Assessment/Plan: #1 pulmonary mass Questionable etiology. Concern for malignancy. Patient has been seen by pulmonary and patient for CT-guided biopsy of the mass per interventional radiology. Continue current regimen of Mucinex, nebulizer treatments. Informed interventional radiology that patient receive Lovenox last night. Lovenox has been discontinued and patient currently on SCDs. Pulmonary following and appreciate input and recommendations.  #2 Questionable postobstructive pneumonitis/lobar pneumonia Pro calcitonin and lactic acid levels were within normal limits. Patient afebrile. Blood cultures pending with no growth to date. IV antibiotics have been discontinued. Follow.  #3 leukocytosis Likely secondary to problem #1. Patient has been pancultured results are negative today. IV Levaquin was discontinued. Pro calcitonin and lactic acid levels within normal limits.  #4 anemia H&H stable. Anemia panel pending.  #5 tobacco abuse Tobacco cessation. Patient states nicotine patch made her jittery and a such removed it.  #6 chest pain on respiration Likely secondary to problem #1. Improved. Pain management.  #7 thrombocytosis Maybe secondary to problem #1. Trending down. Follow.  #8 prophylaxis PPI for GI. SCDs for DVT prophylaxis.  Code Status: Full Family Communication: Updated patient and boyfriend at bedside. Disposition Plan: Remain inpatient.   Consultants:  Pulmonary and critical care medicine: Dr. Posey Rea 2/ 25/ 2016  Procedures:  Ct ANGIOGRAM CHEST 06/28/2014  Chest x-ray 06/28/2014  Antibiotics:  IV Levaquin 06/28/2014>>>> 06/28/2014  HPI/Subjective: Patient states breathing has improved. No complaints.  Objective: Filed Vitals:   06/29/14 0724  BP:   Pulse:   Temp: 98.3 F (36.8 C)  Resp:    No intake or output data in the 24 hours  ending 06/29/14 1215 Filed Weights   06/29/14 0619  Weight: 58.968 kg (130 lb)    Exam:   General:  NAD  Cardiovascular: RRR  Respiratory: CTAB  Abdomen: Soft, nontender, nondistended, positive bowel sounds.  Musculoskeletal: No clubbing cyanosis or edema.  Data Reviewed: Basic Metabolic Panel:  Recent Labs Lab 06/28/14 0543 06/28/14 1434 06/29/14 0600  NA 133*  --  139  K 3.9  --  4.0  CL 100  --  105  CO2 24  --  24  GLUCOSE 94  --  92  BUN 9  --  8  CREATININE 0.55 0.58 0.59  CALCIUM 8.7  --  8.6  MG  --  2.1  --    Liver Function Tests:  Recent Labs Lab 06/28/14 0914 06/29/14 0600  AST 19 16  ALT 11 11  ALKPHOS 57 45  BILITOT 0.4 0.4  PROT 7.4 6.6  ALBUMIN 3.1* 2.7*   No results for input(s): LIPASE, AMYLASE in the last 168 hours. No results for input(s): AMMONIA in the last 168 hours. CBC:  Recent Labs Lab 06/28/14 0543 06/28/14 1434 06/29/14 0600  WBC 11.3* 10.5 9.2  NEUTROABS 7.8*  --   --   HGB 10.7* 9.2* 8.7*  HCT 35.1* 29.3* 27.9*  MCV 82.0 80.5 80.9  PLT 559* 415* 407*   Cardiac Enzymes: No results for input(s): CKTOTAL, CKMB, CKMBINDEX, TROPONINI in the last 168 hours. BNP (last 3 results) No results for input(s): BNP in the last 8760 hours.  ProBNP (last 3 results) No results for input(s): PROBNP in the last 8760 hours.  CBG:  Recent Labs Lab 06/29/14 0757  GLUCAP 92    Recent Results (from the past 240 hour(s))  Culture, blood (routine x 2) Call MD if unable to obtain prior to antibiotics being  given     Status: None (Preliminary result)   Collection Time: 06/28/14  9:14 AM  Result Value Ref Range Status   Specimen Description BLOOD LEFT ARM  Final   Special Requests   Final    BOTTLES DRAWN AEROBIC AND ANAEROBIC 5CC BOTH BOTTLES   Culture   Final           BLOOD CULTURE RECEIVED NO GROWTH TO DATE CULTURE WILL BE HELD FOR 5 DAYS BEFORE ISSUING A FINAL NEGATIVE REPORT Performed at [facility]    Report  Status PENDING  Incomplete  Culture, blood (routine x 2) Call MD if unable to obtain prior to antibiotics being given     Status: None (Preliminary result)   Collection Time: 06/28/14  9:15 AM  Result Value Ref Range Status   Specimen Description BLOOD LEFT HAND  Final   Special Requests   Final    BOTTLES DRAWN AEROBIC AND ANAEROBIC 5CC BOTH BOTTLES   Culture   Final           BLOOD CULTURE RECEIVED NO GROWTH TO DATE CULTURE WILL BE HELD FOR 5 DAYS BEFORE ISSUING A FINAL NEGATIVE REPORT Performed at [facility]    Report Status PENDING  Incomplete  Surgical pcr screen     Status: None   Collection Time: 06/29/14  4:23 AM  Result Value Ref Range Status   MRSA, PCR NEGATIVE NEGATIVE Final   Staphylococcus aureus NEGATIVE NEGATIVE Final    Comment:        The Xpert SA Assay (FDA approved for NASAL specimens in patients over 48 years of age), is one component of a comprehensive surveillance program.  Test performance has been validated by [facility] for patients greater than or equal to 45 year old. It is not intended to diagnose infection nor to guide or monitor treatment.      Studies: Dg Chest 2 View  06/28/2014   CLINICAL DATA:  Cough for 3 weeks. Chest pain. Shortness of breath.  EXAM: CHEST  2 VIEW  COMPARISON:  None.  FINDINGS: Complete opacification of the right upper lobe with questionable partial collapse. Right lower lung zone is clear. The left lung is clear. The heart size is normal. There is no pleural effusion. No pneumothorax. No acute osseous abnormalities are seen.  IMPRESSION: Complete opacification of the right upper lobe. This may reflect pneumonia with partial collapse. Chest CT with contrast recommended for further evaluation to evaluate for/exclude central obstructing lesion.   Electronically Signed   By: Jeb Levering M.D.   On: 06/28/2014 05:57   Ct Angio Chest Pe W/cm &/or Wo Cm  06/28/2014   CLINICAL DATA:  Shortness of breath and wheezing;  hemoptysis  EXAM: CT ANGIOGRAPHY CHEST WITH CONTRAST  TECHNIQUE: Multidetector CT imaging of the chest was performed using the standard protocol during bolus administration of intravenous contrast. Multiplanar CT image reconstructions and MIPs were obtained to evaluate the vascular anatomy.  CONTRAST:  181mL OMNIPAQUE IOHEXOL 350 MG/ML SOLN  COMPARISON:  Chest radiograph June 28, 2014  FINDINGS: There is no demonstrable pulmonary embolus. There is no thoracic aortic aneurysm or dissection.  There is obstruction of the right upper lobe bronchus. There is consolidation throughout most of the right upper lobe.  There is underlying emphysematous change with scattered small bullae throughout the aerated lungs. On axial slice 63 series 7, there is a 4 mm nodular opacity in the anterior segment of the right lower lobe. There is no  appreciable thoracic adenopathy. The pericardium is not thickened.  In the visualized upper abdomen, there is hepatic steatosis.  There are no blastic or lytic bone lesions. There is a 5 mm nodular opacity in the anterior aspect of the right lobe of the thyroid.  Review of the MIP images confirms the above findings.  IMPRESSION: No demonstrable pulmonary embolus.  Obstruction of the right upper lobe bronchus with extensive consolidation. Suspect mass with postobstructive pneumonitis. It is not possible to separate mass from consolidation in the right upper lobe. This finding may well warrant bronchoscopy to further assess.  4 mm nodular opacity anterior segment right lower lobe. This finding could represent a small metastasis.  Underlying emphysematous change.  No adenopathy.  Hepatic steatosis.   Electronically Signed   By: Lowella Grip III M.D.   On: 06/28/2014 07:57    Scheduled Meds: . dextromethorphan-guaiFENesin  2 tablet Oral BID  . docusate sodium  100 mg Oral BID  . ipratropium-albuterol  3 mL Nebulization QID  . nicotine  21 mg Transdermal Daily  . pantoprazole  40 mg  Oral Q0600   Continuous Infusions: . sodium chloride 75 mL/hr at 06/29/14 1059    Principal Problem:   Pulmonary mass Active Problems:   Lobar pneumonia: CAP   Leukocytosis   Anemia   Thrombocytosis   Tobacco abuse   Chest pain on respiration   Lung mass    Time spent: 40 mins    [facility] MD [facility] Pager [phone]. If 7PM-7AM, please contact night-coverage at [email or web address], password [facility] 06/29/2014, 12:15 PM  LOS: 1 day

## 2014-06-30 LAB — PROCALCITONIN

## 2014-06-30 LAB — CBC
HEMATOCRIT: 26.8 % — AB (ref 36.0–46.0)
Hemoglobin: 8.1 g/dL — ABNORMAL LOW (ref 12.0–15.0)
MCH: 25.1 pg — ABNORMAL LOW (ref 26.0–34.0)
MCHC: 30.2 g/dL (ref 30.0–36.0)
MCV: 83 fL (ref 78.0–100.0)
Platelets: 423 10*3/uL — ABNORMAL HIGH (ref 150–400)
RBC: 3.23 MIL/uL — ABNORMAL LOW (ref 3.87–5.11)
RDW: 15.5 % (ref 11.5–15.5)
WBC: 7.9 10*3/uL (ref 4.0–10.5)

## 2014-06-30 LAB — HEMOGLOBIN AND HEMATOCRIT, BLOOD
HEMATOCRIT: 27.3 % — AB (ref 36.0–46.0)
Hemoglobin: 8.6 g/dL — ABNORMAL LOW (ref 12.0–15.0)

## 2014-06-30 LAB — BASIC METABOLIC PANEL
ANION GAP: 7 (ref 5–15)
BUN: 9 mg/dL (ref 6–23)
CALCIUM: 8.1 mg/dL — AB (ref 8.4–10.5)
CO2: 26 mmol/L (ref 19–32)
Chloride: 106 mmol/L (ref 96–112)
Creatinine, Ser: 0.62 mg/dL (ref 0.50–1.10)
GFR calc Af Amer: >90 mL/min (ref >90)
GFR calc non Af Amer: >90 mL/min (ref >90)
Glucose, Bld: 102 mg/dL — ABNORMAL HIGH (ref 70–99)
Potassium: 4.4 mmol/L (ref 3.5–5.1)
Sodium: 139 mmol/L (ref 135–145)

## 2014-06-30 LAB — IRON AND TIBC: UIBC: 193 ug/dL (ref 125–400)

## 2014-06-30 LAB — RETICULOCYTES
RBC.: 3.4 MIL/uL — ABNORMAL LOW (ref 3.87–5.11)
RETIC COUNT ABSOLUTE: 61.2 10*3/uL (ref 19.0–186.0)
Retic Ct Pct: 1.8 % (ref 0.4–3.1)

## 2014-06-30 LAB — FOLATE: Folate: 9.5 ng/mL

## 2014-06-30 LAB — GLUCOSE, CAPILLARY: GLUCOSE-CAPILLARY: 80 mg/dL (ref 70–99)

## 2014-06-30 LAB — VITAMIN B12: Vitamin B-12: 477 pg/mL (ref 211–911)

## 2014-06-30 LAB — FERRITIN: FERRITIN: 163 ng/mL (ref 10–291)

## 2014-06-30 MED ORDER — SODIUM CHLORIDE 0.9 % IV SOLN
510.0000 mg | Freq: Once | INTRAVENOUS | Status: AC
  Administered 2014-06-30: 510 mg via INTRAVENOUS
  Filled 2014-06-30: qty 17

## 2014-06-30 MED ORDER — SODIUM CHLORIDE 0.9 % IV BOLUS (SEPSIS)
500.0000 mL | Freq: Once | INTRAVENOUS | Status: AC
  Administered 2014-06-30: 500 mL via INTRAVENOUS

## 2014-06-30 NOTE — Progress Notes (Signed)
[facility] PROGRESS NOTE  Barbara Frost HEN:[id number] DOB: 24-Oct-1970 DOA: 06/28/2014 PCP: [name], MD  Assessment/Plan: #1 pulmonary mass Questionable etiology. Concern for malignancy. Patient has been seen by pulmonary and patient s/p CT-guided biopsy of the mass per interventional radiology yesterday. Continue current regimen of Mucinex, nebulizer treatments. Lovenox has been discontinued and patient currently on SCDs. Pulmonary following and appreciate input and recommendations.  #2 Questionable postobstructive pneumonitis/lobar pneumonia Pro calcitonin and lactic acid levels were within normal limits. Patient afebrile. Blood cultures pending with no growth to date. IV antibiotics have been discontinued. Follow.  #3 leukocytosis Likely secondary to problem #1. Patient has been pancultured results are negative to date. IV Levaquin was discontinued. Pro calcitonin and lactic acid levels within normal limits.  #4 anemia H&H stable. Anemia panel pending.  #5 tobacco abuse Tobacco cessation. Patient states nicotine patch made her jittery and as such removed it.  #6 chest pain on respiration Likely secondary to problem #1. Improved. Pain management.  #7 thrombocytosis Maybe secondary to problem #1. Trending down. Follow.  #8 prophylaxis PPI for GI. SCDs for DVT prophylaxis.  Code Status: Full Family Communication: Updated patient and boyfriend at bedside. Disposition Plan: Remain inpatient.   Consultants:  Pulmonary and critical care medicine: Dr. Posey Rea 2/ 25/ 2016  Procedures:  Ct ANGIOGRAM CHEST 06/28/2014  Chest x-ray 06/28/2014  CT chest biopsy per IR 06/29/14  Antibiotics:  IV Levaquin 06/28/2014>>>> 06/28/2014  HPI/Subjective: Patient states breathing has improved. No complaints.  Objective: Filed Vitals:   06/30/14 0534  BP: 96/56  Pulse: 73  Temp: 98 F (36.7 C)  Resp: 18   No intake or output data in the 24 hours ending 06/30/14  1127 Filed Weights   06/29/14 0619 06/30/14 0534  Weight: 58.968 kg (130 lb) 56.7 kg (125 lb)    Exam:   General:  NAD  Cardiovascular: RRR  Respiratory: CTAB  Abdomen: Soft, nontender, nondistended, positive bowel sounds.  Musculoskeletal: No clubbing cyanosis or edema.  Data Reviewed: Basic Metabolic Panel:  Recent Labs Lab 06/28/14 0543 06/28/14 1434 06/29/14 0600 06/30/14 0541  NA 133*  --  139 139  K 3.9  --  4.0 4.4  CL 100  --  105 106  CO2 24  --  24 26  GLUCOSE 94  --  92 102*  BUN 9  --  8 9  CREATININE 0.55 0.58 0.59 0.62  CALCIUM 8.7  --  8.6 8.1*  MG  --  2.1  --   --    Liver Function Tests:  Recent Labs Lab 06/28/14 0914 06/29/14 0600  AST 19 16  ALT 11 11  ALKPHOS 57 45  BILITOT 0.4 0.4  PROT 7.4 6.6  ALBUMIN 3.1* 2.7*   No results for input(s): LIPASE, AMYLASE in the last 168 hours. No results for input(s): AMMONIA in the last 168 hours. CBC:  Recent Labs Lab 06/28/14 0543 06/28/14 1434 06/29/14 0600 06/30/14 0541  WBC 11.3* 10.5 9.2 7.9  NEUTROABS 7.8*  --   --   --   HGB 10.7* 9.2* 8.7* 8.1*  HCT 35.1* 29.3* 27.9* 26.8*  MCV 82.0 80.5 80.9 83.0  PLT 559* 415* 407* 423*   Cardiac Enzymes: No results for input(s): CKTOTAL, CKMB, CKMBINDEX, TROPONINI in the last 168 hours. BNP (last 3 results) No results for input(s): BNP in the last 8760 hours.  ProBNP (last 3 results) No results for input(s): PROBNP in the last 8760 hours.  CBG:  Recent Labs Lab 06/29/14 [id number]  06/30/14 0756  GLUCAP 92 80    Recent Results (from the past 240 hour(s))  Culture, blood (routine x 2) Call MD if unable to obtain prior to antibiotics being given     Status: None (Preliminary result)   Collection Time: 06/28/14  9:14 AM  Result Value Ref Range Status   Specimen Description BLOOD LEFT ARM  Final   Special Requests   Final    BOTTLES DRAWN AEROBIC AND ANAEROBIC 5CC BOTH BOTTLES   Culture   Final           BLOOD CULTURE RECEIVED NO  GROWTH TO DATE CULTURE WILL BE HELD FOR 5 DAYS BEFORE ISSUING A FINAL NEGATIVE REPORT Performed at [facility]    Report Status PENDING  Incomplete  Culture, blood (routine x 2) Call MD if unable to obtain prior to antibiotics being given     Status: None (Preliminary result)   Collection Time: 06/28/14  9:15 AM  Result Value Ref Range Status   Specimen Description BLOOD LEFT HAND  Final   Special Requests   Final    BOTTLES DRAWN AEROBIC AND ANAEROBIC 5CC BOTH BOTTLES   Culture   Final           BLOOD CULTURE RECEIVED NO GROWTH TO DATE CULTURE WILL BE HELD FOR 5 DAYS BEFORE ISSUING A FINAL NEGATIVE REPORT Performed at [facility]    Report Status PENDING  Incomplete  Culture, Urine     Status: None   Collection Time: 06/28/14 12:24 PM  Result Value Ref Range Status   Specimen Description URINE, CLEAN CATCH  Final   Special Requests NONE  Final   Colony Count NO GROWTH Performed at [facility]   Final   Culture NO GROWTH Performed at [facility]   Final   Report Status 06/29/2014 FINAL  Final  Surgical pcr screen     Status: None   Collection Time: 06/29/14  4:23 AM  Result Value Ref Range Status   MRSA, PCR NEGATIVE NEGATIVE Final   Staphylococcus aureus NEGATIVE NEGATIVE Final    Comment:        The Xpert SA Assay (FDA approved for NASAL specimens in patients over 4 years of age), is one component of a comprehensive surveillance program.  Test performance has been validated by [facility] for patients greater than or equal to 45 year old. It is not intended to diagnose infection nor to guide or monitor treatment.      Studies: Dg Chest 1 View  06/29/2014   CLINICAL DATA:  Evaluate for pneumothorax after biopsy.  EXAM: CHEST  1 VIEW  COMPARISON:  06/28/2014 CT and plain film.  FINDINGS: Large right upper lobe mass/ opacity again noted, unchanged. No pneumothorax. No effusion. Left lung is clear. Heart is normal size.  IMPRESSION:  No pneumothorax following right upper lobe mass biopsy.   Electronically Signed   By: Rolm Baptise M.D.   On: 06/29/2014 19:19   Ct Biopsy  06/29/2014   CLINICAL DATA:  44 year old female with right upper lung mass. She has been referred for percutaneous biopsy.  EXAM: CT GUIDED BIOPSY BIOPSY OF RIGHT LUNG MASS  ANESTHESIA/SEDATION: 3.0  Mg IV Versed; 50 mcg IV Fentanyl  Total Moderate Sedation Time: 14 minutes.  PROCEDURE: The procedure risks, benefits, and alternatives were explained to the patient. Questions regarding the procedure were encouraged and answered. The patient understands and consents to the procedure.  CT of the chest performed for  planning purposes.  The right anterior chest was prepped with Betadinein a sterile fashion, and a sterile drape was applied covering the operative field. A sterile gown and sterile gloves were used for the procedure. Local anesthesia was provided with 1% Lidocaine.  Once the patient is prepped and draped sterilely in the skin and subcutaneous tissues were infiltrated with 1% lidocaine for local anesthesia. A 17 gauge guide needle was then advanced through the chest wall into the mass of the right upper lobe. Four separate 18 gauge biopsy were retrieved. These are placed in formalin.  The needle was removed and a final CT study was performed.  No complicating feature for identified.  The patient tolerated the procedure well and remained hemodynamically stable throughout.  No complications were encountered and no significant blood loss was encountered.  COMPLICATIONS: None  FINDINGS: Right upper lobe mass, similar to the prior CT.  There are some ill-defined density central as within the mass. These were targeted with the needle, as the mass is difficult to differentiate from overlying atelectatic lung.  Status post biopsy, no complicating features were identified.  IMPRESSION: Status post CT-guided biopsy of right upper lobe lung mass. Tissue specimen sent to pathology  for complete histopathologic analysis.  Signed,  Dulcy Fanny. Earleen Newport, DO  [facility]  [facility]   Electronically Signed   By: Corrie Mckusick D.O.   On: 06/29/2014 18:48    Scheduled Meds: . dextromethorphan-guaiFENesin  2 tablet Oral BID  . docusate sodium  100 mg Oral BID  . nicotine  21 mg Transdermal Daily  . pantoprazole  40 mg Oral Q0600   Continuous Infusions: . sodium chloride 75 mL/hr at 06/29/14 2209    Principal Problem:   Pulmonary mass Active Problems:   Lobar pneumonia: CAP   Leukocytosis   Anemia   Thrombocytosis   Tobacco abuse   Chest pain on respiration   Lung mass    Time spent: 40 mins    [facility] MD [facility] Pager [phone]. If 7PM-7AM, please contact night-coverage at [email or web address], password [facility] 06/30/2014, 11:27 AM  LOS: 2 days

## 2014-06-30 NOTE — Progress Notes (Signed)
MEDICATION RELATED CONSULT NOTE - INITIAL   Pharmacy Consult for IV Iron Indication: Iron-deficiency Anemia  No Known Allergies  Patient Measurements: Height: 5' (152.4 cm) Weight: 125 lb (56.7 kg) IBW/kg (Calculated) : 45.5  Vital Signs: Temp: 99.5 F (37.5 C) (02/27 1427) Temp Source: Oral (02/27 1427) BP: 100/62 mmHg (02/27 1427) Pulse Rate: 75 (02/27 1427)  Labs:  Recent Labs  06/28/14 0914 06/28/14 1434 06/29/14 0600 06/30/14 0541 06/30/14 1337  WBC  --  10.5 9.2 7.9  --   HGB  --  9.2* 8.7* 8.1* 8.6*  HCT  --  29.3* 27.9* 26.8* 27.3*  PLT  --  415* 407* 423*  --   CREATININE  --  0.58 0.59 0.62  --   MG  --  2.1  --   --   --   ALBUMIN 3.1*  --  2.7*  --   --   PROT 7.4  --  6.6  --   --   AST 19  --  16  --   --   ALT 11  --  11  --   --   ALKPHOS 57  --  45  --   --   BILITOT 0.4  --  0.4  --   --   BILIDIR <0.1  --   --   --   --   IBILI NOT CALCULATED  --   --   --   --    Estimated Creatinine Clearance: 70.8 mL/min (by C-G formula based on Cr of 0.62).  Assessment: 44 y/o F here with shortness of breath, found to have iron deficiency anemia.   Iron <10  Plan:  -Feraheme 510 mg IV x 1 -Would recommend starting PO iron supplement   Narda Bonds 06/30/2014,6:58 PM

## 2014-07-01 LAB — BASIC METABOLIC PANEL
Anion gap: 5 (ref 5–15)
BUN: 6 mg/dL (ref 6–23)
CO2: 28 mmol/L (ref 19–32)
Calcium: 8.7 mg/dL (ref 8.4–10.5)
Chloride: 104 mmol/L (ref 96–112)
Creatinine, Ser: 0.56 mg/dL (ref 0.50–1.10)
GFR calc non Af Amer: >90 mL/min (ref >90)
GLUCOSE: 97 mg/dL (ref 70–99)
Potassium: 4 mmol/L (ref 3.5–5.1)
SODIUM: 137 mmol/L (ref 135–145)

## 2014-07-01 LAB — CBC
HCT: 28.4 % — ABNORMAL LOW (ref 36.0–46.0)
Hemoglobin: 9 g/dL — ABNORMAL LOW (ref 12.0–15.0)
MCH: 25.9 pg — ABNORMAL LOW (ref 26.0–34.0)
MCHC: 31.7 g/dL (ref 30.0–36.0)
MCV: 81.8 fL (ref 78.0–100.0)
PLATELETS: 442 10*3/uL — AB (ref 150–400)
RBC: 3.47 MIL/uL — ABNORMAL LOW (ref 3.87–5.11)
RDW: 15.4 % (ref 11.5–15.5)
WBC: 8.8 10*3/uL (ref 4.0–10.5)

## 2014-07-01 LAB — GLUCOSE, CAPILLARY: GLUCOSE-CAPILLARY: 92 mg/dL (ref 70–99)

## 2014-07-01 MED ORDER — IBUPROFEN 600 MG PO TABS
600.0000 mg | ORAL_TABLET | Freq: Four times a day (QID) | ORAL | Status: DC
  Administered 2014-07-01 – 2014-07-03 (×10): 600 mg via ORAL
  Filled 2014-07-01 (×4): qty 1
  Filled 2014-07-01 (×2): qty 3
  Filled 2014-07-01 (×2): qty 1
  Filled 2014-07-01: qty 3
  Filled 2014-07-01 (×5): qty 1

## 2014-07-01 MED ORDER — POLYSACCHARIDE IRON COMPLEX 150 MG PO CAPS
150.0000 mg | ORAL_CAPSULE | Freq: Every day | ORAL | Status: DC
Start: 2014-07-01 — End: 2014-07-03
  Administered 2014-07-01 – 2014-07-03 (×3): 150 mg via ORAL
  Filled 2014-07-01 (×3): qty 1

## 2014-07-01 NOTE — Progress Notes (Signed)
[facility] PROGRESS NOTE  Barbara Frost YTK:[id number] DOB: Jul 13, 1970 DOA: 06/28/2014 PCP: [name], MD  Assessment/Plan: #1 pulmonary mass Questionable etiology. Concern for malignancy. Patient has been seen by pulmonary and patient s/p CT-guided biopsy of the mass per interventional radiology. Continue current regimen of Mucinex, nebulizer treatments. Lovenox has been discontinued and patient currently on SCDs. Pulmonary following and appreciate input and recommendations.  #2 Questionable postobstructive pneumonitis/lobar pneumonia Pro calcitonin and lactic acid levels were within normal limits. Patient afebrile. Blood cultures pending with no growth to date. IV antibiotics have been discontinued. Follow.  #3 leukocytosis Likely secondary to problem #1. Patient has been pancultured results are negative to date. IV Levaquin was discontinued. Pro calcitonin and lactic acid levels within normal limits. WBC is trending down.  #4 iron deficiency anemia Patient with a iron deficiency anemia in a menstruating female. Patient status post IV iron. Will likely need oral iron tablets. Follow H&H.  #5 tobacco abuse Tobacco cessation. Patient states nicotine patch made her jittery and as such removed it.  #6 chest pain on respiration Likely secondary to problem #1. Pain management.  #7 thrombocytosis Maybe secondary to problem #1. Trending down. Follow.  #8 prophylaxis PPI for GI. SCDs for DVT prophylaxis.  Code Status: Full Family Communication: Updated patient and boyfriend at bedside. Disposition Plan: Remain inpatient.   Consultants:  Pulmonary and critical care medicine: Dr. Posey Rea 2/ 25/ 2016  Procedures:  Ct ANGIOGRAM CHEST 06/28/2014  Chest x-ray 06/28/2014  CT chest biopsy per IR 06/29/14  Antibiotics:  IV Levaquin 06/28/2014>>>> 06/28/2014  HPI/Subjective: Patient states breathing has improved. Patient complaining of pain to palpation on the right upper  chest and the right back.  Objective: Filed Vitals:   07/01/14 0559  BP: 95/57  Pulse: 75  Temp: 98.4 F (36.9 C)  Resp: 16    Intake/Output Summary (Last 24 hours) at 07/01/14 1129 Last data filed at 07/01/14 0830  Gross per 24 hour  Intake 2781.25 ml  Output      0 ml  Net 2781.25 ml   Filed Weights   06/29/14 0619 06/30/14 0534 07/01/14 0600  Weight: 58.968 kg (130 lb) 56.7 kg (125 lb) 54.749 kg (120 lb 11.2 oz)    Exam:   General:  NAD  Cardiovascular: RRR  Respiratory: CTAB  Abdomen: Soft, nontender, nondistended, positive bowel sounds.  Musculoskeletal: No clubbing cyanosis or edema. Tenderness to palpation in the right upper chest and right back.  Data Reviewed: Basic Metabolic Panel:  Recent Labs Lab 06/28/14 0543 06/28/14 1434 06/29/14 0600 06/30/14 0541 07/01/14 0523  NA 133*  --  139 139 137  K 3.9  --  4.0 4.4 4.0  CL 100  --  105 106 104  CO2 24  --  24 26 28   GLUCOSE 94  --  92 102* 97  BUN 9  --  8 9 6   CREATININE 0.55 0.58 0.59 0.62 0.56  CALCIUM 8.7  --  8.6 8.1* 8.7  MG  --  2.1  --   --   --    Liver Function Tests:  Recent Labs Lab 06/28/14 0914 06/29/14 0600  AST 19 16  ALT 11 11  ALKPHOS 57 45  BILITOT 0.4 0.4  PROT 7.4 6.6  ALBUMIN 3.1* 2.7*   No results for input(s): LIPASE, AMYLASE in the last 168 hours. No results for input(s): AMMONIA in the last 168 hours. CBC:  Recent Labs Lab 06/28/14 0543 06/28/14 1434 06/29/14 0600 06/30/14 0541 06/30/14 1337  07/01/14 0523  WBC 11.3* 10.5 9.2 7.9  --  8.8  NEUTROABS 7.8*  --   --   --   --   --   HGB 10.7* 9.2* 8.7* 8.1* 8.6* 9.0*  HCT 35.1* 29.3* 27.9* 26.8* 27.3* 28.4*  MCV 82.0 80.5 80.9 83.0  --  81.8  PLT 559* 415* 407* 423*  --  442*   Cardiac Enzymes: No results for input(s): CKTOTAL, CKMB, CKMBINDEX, TROPONINI in the last 168 hours. BNP (last 3 results) No results for input(s): BNP in the last 8760 hours.  ProBNP (last 3 results) No results for  input(s): PROBNP in the last 8760 hours.  CBG:  Recent Labs Lab 06/29/14 0757 06/30/14 0756 07/01/14 0741  GLUCAP 92 80 92    Recent Results (from the past 240 hour(s))  Culture, blood (routine x 2) Call MD if unable to obtain prior to antibiotics being given     Status: None (Preliminary result)   Collection Time: 06/28/14  9:14 AM  Result Value Ref Range Status   Specimen Description BLOOD LEFT ARM  Final   Special Requests   Final    BOTTLES DRAWN AEROBIC AND ANAEROBIC 5CC BOTH BOTTLES   Culture   Final           BLOOD CULTURE RECEIVED NO GROWTH TO DATE CULTURE WILL BE HELD FOR 5 DAYS BEFORE ISSUING A FINAL NEGATIVE REPORT Performed at [facility]    Report Status PENDING  Incomplete  Culture, blood (routine x 2) Call MD if unable to obtain prior to antibiotics being given     Status: None (Preliminary result)   Collection Time: 06/28/14  9:15 AM  Result Value Ref Range Status   Specimen Description BLOOD LEFT HAND  Final   Special Requests   Final    BOTTLES DRAWN AEROBIC AND ANAEROBIC 5CC BOTH BOTTLES   Culture   Final           BLOOD CULTURE RECEIVED NO GROWTH TO DATE CULTURE WILL BE HELD FOR 5 DAYS BEFORE ISSUING A FINAL NEGATIVE REPORT Performed at [facility]    Report Status PENDING  Incomplete  Culture, Urine     Status: None   Collection Time: 06/28/14 12:24 PM  Result Value Ref Range Status   Specimen Description URINE, CLEAN CATCH  Final   Special Requests NONE  Final   Colony Count NO GROWTH Performed at [facility]   Final   Culture NO GROWTH Performed at [facility]   Final   Report Status 06/29/2014 FINAL  Final  Surgical pcr screen     Status: None   Collection Time: 06/29/14  4:23 AM  Result Value Ref Range Status   MRSA, PCR NEGATIVE NEGATIVE Final   Staphylococcus aureus NEGATIVE NEGATIVE Final    Comment:        The Xpert SA Assay (FDA approved for NASAL specimens in patients over 73 years of age), is  one component of a comprehensive surveillance program.  Test performance has been validated by [facility] for patients greater than or equal to 43 year old. It is not intended to diagnose infection nor to guide or monitor treatment.      Studies: Dg Chest 1 View  06/29/2014   CLINICAL DATA:  Evaluate for pneumothorax after biopsy.  EXAM: CHEST  1 VIEW  COMPARISON:  06/28/2014 CT and plain film.  FINDINGS: Large right upper lobe mass/ opacity again noted, unchanged. No pneumothorax. No effusion.  Left lung is clear. Heart is normal size.  IMPRESSION: No pneumothorax following right upper lobe mass biopsy.   Electronically Signed   By: Rolm Baptise M.D.   On: 06/29/2014 19:19   Ct Biopsy  06/29/2014   CLINICAL DATA:  44 year old female with right upper lung mass. She has been referred for percutaneous biopsy.  EXAM: CT GUIDED BIOPSY BIOPSY OF RIGHT LUNG MASS  ANESTHESIA/SEDATION: 3.0  Mg IV Versed; 50 mcg IV Fentanyl  Total Moderate Sedation Time: 14 minutes.  PROCEDURE: The procedure risks, benefits, and alternatives were explained to the patient. Questions regarding the procedure were encouraged and answered. The patient understands and consents to the procedure.  CT of the chest performed for planning purposes.  The right anterior chest was prepped with Betadinein a sterile fashion, and a sterile drape was applied covering the operative field. A sterile gown and sterile gloves were used for the procedure. Local anesthesia was provided with 1% Lidocaine.  Once the patient is prepped and draped sterilely in the skin and subcutaneous tissues were infiltrated with 1% lidocaine for local anesthesia. A 17 gauge guide needle was then advanced through the chest wall into the mass of the right upper lobe. Four separate 18 gauge biopsy were retrieved. These are placed in formalin.  The needle was removed and a final CT study was performed.  No complicating feature for identified.  The patient tolerated the  procedure well and remained hemodynamically stable throughout.  No complications were encountered and no significant blood loss was encountered.  COMPLICATIONS: None  FINDINGS: Right upper lobe mass, similar to the prior CT.  There are some ill-defined density central as within the mass. These were targeted with the needle, as the mass is difficult to differentiate from overlying atelectatic lung.  Status post biopsy, no complicating features were identified.  IMPRESSION: Status post CT-guided biopsy of right upper lobe lung mass. Tissue specimen sent to pathology for complete histopathologic analysis.  Signed,  Dulcy Fanny. Earleen Newport, DO  [facility]  [facility]   Electronically Signed   By: Corrie Mckusick D.O.   On: 06/29/2014 18:48    Scheduled Meds: . dextromethorphan-guaiFENesin  2 tablet Oral BID  . docusate sodium  100 mg Oral BID  . iron polysaccharides  150 mg Oral Daily  . nicotine  21 mg Transdermal Daily  . pantoprazole  40 mg Oral Q0600   Continuous Infusions:    Principal Problem:   Pulmonary mass Active Problems:   Lobar pneumonia: CAP   Leukocytosis   Anemia   Thrombocytosis   Tobacco abuse   Chest pain on respiration   Lung mass    Time spent: 40 mins    [facility] MD [facility] Pager [phone]. If 7PM-7AM, please contact night-coverage at [email or web address], password [facility] 07/01/2014, 11:29 AM  LOS: 3 days

## 2014-07-02 LAB — BASIC METABOLIC PANEL
Anion gap: 6 (ref 5–15)
BUN: 10 mg/dL (ref 6–23)
CO2: 27 mmol/L (ref 19–32)
Calcium: 8.6 mg/dL (ref 8.4–10.5)
Chloride: 106 mmol/L (ref 96–112)
Creatinine, Ser: 0.58 mg/dL (ref 0.50–1.10)
GFR calc Af Amer: >90 mL/min (ref >90)
GFR calc non Af Amer: >90 mL/min (ref >90)
Glucose, Bld: 98 mg/dL (ref 70–99)
Potassium: 4.3 mmol/L (ref 3.5–5.1)
Sodium: 139 mmol/L (ref 135–145)

## 2014-07-02 LAB — GLUCOSE, CAPILLARY: Glucose-Capillary: 99 mg/dL (ref 70–99)

## 2014-07-02 LAB — CBC
HCT: 29.2 % — ABNORMAL LOW (ref 36.0–46.0)
Hemoglobin: 9.1 g/dL — ABNORMAL LOW (ref 12.0–15.0)
MCH: 25.5 pg — ABNORMAL LOW (ref 26.0–34.0)
MCHC: 31.2 g/dL (ref 30.0–36.0)
MCV: 81.8 fL (ref 78.0–100.0)
Platelets: 404 10*3/uL — ABNORMAL HIGH (ref 150–400)
RBC: 3.57 MIL/uL — ABNORMAL LOW (ref 3.87–5.11)
RDW: 15.2 % (ref 11.5–15.5)
WBC: 8.3 10*3/uL (ref 4.0–10.5)

## 2014-07-02 MED ORDER — SODIUM CHLORIDE 0.9 % IV SOLN
INTRAVENOUS | Status: DC
  Filled 2014-07-02 (×2): qty 1000

## 2014-07-02 MED ORDER — SODIUM CHLORIDE 0.9 % IV SOLN
INTRAVENOUS | Status: DC
  Administered 2014-07-02 – 2014-07-03 (×3): via INTRAVENOUS

## 2014-07-02 MED ORDER — SODIUM CHLORIDE 0.9 % IV SOLN
INTRAVENOUS | Status: DC
  Administered 2014-07-02: 10:00:00 via INTRAVENOUS

## 2014-07-02 MED ORDER — SODIUM CHLORIDE 0.9 % IV BOLUS (SEPSIS)
1000.0000 mL | Freq: Once | INTRAVENOUS | Status: AC
  Administered 2014-07-02: 1000 mL via INTRAVENOUS

## 2014-07-02 NOTE — Progress Notes (Signed)
[facility] PROGRESS NOTE  Barbara Frost HLK:[id number] DOB: Jan 12, 1971 DOA: 06/28/2014 PCP: [name], MD  Assessment/Plan: #1 pulmonary mass Questionable etiology. Concern for malignancy. Patient has been seen by pulmonary and patient s/p CT-guided biopsy of the mass per interventional radiology. Biopsy pending. Continue current regimen of Mucinex, nebulizer treatments. Lovenox has been discontinued and patient currently on SCDs. Pulmonary following and appreciate input and recommendations.  #2 Questionable postobstructive pneumonitis/lobar pneumonia Pro calcitonin and lactic acid levels were within normal limits. Patient afebrile. Blood cultures pending with no growth to date. IV antibiotics have been discontinued. Follow.  #3 leukocytosis Likely secondary to problem #1. Patient has been pancultured results are negative to date. IV Levaquin was discontinued. Pro calcitonin and lactic acid levels within normal limits. WBC is trending down.  #4 iron deficiency anemia Patient with a iron deficiency anemia in a menstruating female. Patient status post IV iron. Will likely need oral iron tablets. Follow H&H.  #5 tobacco abuse Tobacco cessation. Patient states nicotine patch made her jittery and as such removed it.  #6 chest pain on respiration Likely secondary to problem #1. Pain management.  #7 thrombocytosis Maybe secondary to problem #1. Trending down. Follow.  #8 prophylaxis PPI for GI. SCDs for DVT prophylaxis.  Code Status: Full Family Communication: Updated patient and mother at bedside. Disposition Plan: Remain inpatient.   Consultants:  Pulmonary and critical care medicine: Dr. Posey Rea 2/ 25/ 2016  Procedures:  Ct ANGIOGRAM CHEST 06/28/2014  Chest x-ray 06/28/2014  CT chest biopsy per IR 06/29/14  Antibiotics:  IV Levaquin 06/28/2014>>>> 06/28/2014  HPI/Subjective: Patient states breathing has improved. Patient states less palpation on the right upper  chest and the right back.  Objective: Filed Vitals:   07/02/14 1000  BP: 103/54  Pulse:   Temp:   Resp:     Intake/Output Summary (Last 24 hours) at 07/02/14 1322 Last data filed at 07/01/14 1908  Gross per 24 hour  Intake    480 ml  Output      0 ml  Net    480 ml   Filed Weights   06/29/14 0619 06/30/14 0534 07/01/14 0600  Weight: 58.968 kg (130 lb) 56.7 kg (125 lb) 54.749 kg (120 lb 11.2 oz)    Exam:   General:  NAD  Cardiovascular: RRR  Respiratory: CTAB  Abdomen: Soft, nontender, nondistended, positive bowel sounds.  Musculoskeletal: No clubbing cyanosis or edema. Nontender to palpation in the right upper chest and right back.  Data Reviewed: Basic Metabolic Panel:  Recent Labs Lab 06/28/14 0543 06/28/14 1434 06/29/14 0600 06/30/14 0541 07/01/14 0523 07/02/14 0530  NA 133*  --  139 139 137 139  K 3.9  --  4.0 4.4 4.0 4.3  CL 100  --  105 106 104 106  CO2 24  --  24 26 28 27   GLUCOSE 94  --  92 102* 97 98  BUN 9  --  8 9 6 10   CREATININE 0.55 0.58 0.59 0.62 0.56 0.58  CALCIUM 8.7  --  8.6 8.1* 8.7 8.6  MG  --  2.1  --   --   --   --    Liver Function Tests:  Recent Labs Lab 06/28/14 0914 06/29/14 0600  AST 19 16  ALT 11 11  ALKPHOS 57 45  BILITOT 0.4 0.4  PROT 7.4 6.6  ALBUMIN 3.1* 2.7*   No results for input(s): LIPASE, AMYLASE in the last 168 hours. No results for input(s): AMMONIA in the last 168  hours. CBC:  Recent Labs Lab 06/28/14 0543 06/28/14 1434 06/29/14 0600 06/30/14 0541 06/30/14 1337 07/01/14 0523 07/02/14 0530  WBC 11.3* 10.5 9.2 7.9  --  8.8 8.3  NEUTROABS 7.8*  --   --   --   --   --   --   HGB 10.7* 9.2* 8.7* 8.1* 8.6* 9.0* 9.1*  HCT 35.1* 29.3* 27.9* 26.8* 27.3* 28.4* 29.2*  MCV 82.0 80.5 80.9 83.0  --  81.8 81.8  PLT 559* 415* 407* 423*  --  442* 404*   Cardiac Enzymes: No results for input(s): CKTOTAL, CKMB, CKMBINDEX, TROPONINI in the last 168 hours. BNP (last 3 results) No results for input(s): BNP  in the last 8760 hours.  ProBNP (last 3 results) No results for input(s): PROBNP in the last 8760 hours.  CBG:  Recent Labs Lab 06/29/14 0757 06/30/14 0756 07/01/14 0741 07/02/14 0757  GLUCAP 92 80 92 99    Recent Results (from the past 240 hour(s))  Culture, blood (routine x 2) Call MD if unable to obtain prior to antibiotics being given     Status: None (Preliminary result)   Collection Time: 06/28/14  9:14 AM  Result Value Ref Range Status   Specimen Description BLOOD LEFT ARM  Final   Special Requests   Final    BOTTLES DRAWN AEROBIC AND ANAEROBIC 5CC BOTH BOTTLES   Culture   Final           BLOOD CULTURE RECEIVED NO GROWTH TO DATE CULTURE WILL BE HELD FOR 5 DAYS BEFORE ISSUING A FINAL NEGATIVE REPORT Performed at [facility]    Report Status PENDING  Incomplete  Culture, blood (routine x 2) Call MD if unable to obtain prior to antibiotics being given     Status: None (Preliminary result)   Collection Time: 06/28/14  9:15 AM  Result Value Ref Range Status   Specimen Description BLOOD LEFT HAND  Final   Special Requests   Final    BOTTLES DRAWN AEROBIC AND ANAEROBIC 5CC BOTH BOTTLES   Culture   Final           BLOOD CULTURE RECEIVED NO GROWTH TO DATE CULTURE WILL BE HELD FOR 5 DAYS BEFORE ISSUING A FINAL NEGATIVE REPORT Performed at [facility]    Report Status PENDING  Incomplete  Culture, Urine     Status: None   Collection Time: 06/28/14 12:24 PM  Result Value Ref Range Status   Specimen Description URINE, CLEAN CATCH  Final   Special Requests NONE  Final   Colony Count NO GROWTH Performed at [facility]   Final   Culture NO GROWTH Performed at [facility]   Final   Report Status 06/29/2014 FINAL  Final  Surgical pcr screen     Status: None   Collection Time: 06/29/14  4:23 AM  Result Value Ref Range Status   MRSA, PCR NEGATIVE NEGATIVE Final   Staphylococcus aureus NEGATIVE NEGATIVE Final    Comment:        The Xpert  SA Assay (FDA approved for NASAL specimens in patients over 65 years of age), is one component of a comprehensive surveillance program.  Test performance has been validated by [facility] for patients greater than or equal to 28 year old. It is not intended to diagnose infection nor to guide or monitor treatment.      Studies: No results found.  Scheduled Meds: . dextromethorphan-guaiFENesin  2 tablet Oral BID  . docusate  sodium  100 mg Oral BID  . ibuprofen  600 mg Oral QID  . iron polysaccharides  150 mg Oral Daily  . nicotine  21 mg Transdermal Daily  . pantoprazole  40 mg Oral Q0600   Continuous Infusions: . sodium chloride 125 mL/hr at 07/02/14 1007    Principal Problem:   Pulmonary mass Active Problems:   Lobar pneumonia: CAP   Leukocytosis   Anemia   Thrombocytosis   Tobacco abuse   Chest pain on respiration   Lung mass    Time spent: 40 mins    [facility] MD [facility] Pager [phone]. If 7PM-7AM, please contact night-coverage at [email or web address], password [facility] 07/02/2014, 1:22 PM  LOS: 4 days

## 2014-07-02 NOTE — Progress Notes (Signed)
CARE MANAGEMENT NOTE 07/02/2014  Patient:  [facility]   Account Number:  [id number]  Date Initiated:  07/02/2014  Documentation initiated by:  Edwyna Shell  Subjective/Objective Assessment:   44 yo female admitted with pulmonary mass, leukocytosis, iron def anemia     Action/Plan:   disharge planning   Anticipated DC Date:  07/03/2014   Anticipated DC Plan:  [place]  CM consult  PCP issues      Choice offered to / List presented to:             Status of service:  Completed, signed off Medicare Important Message given?   (If response is "NO", the following Medicare IM given date fields will be blank) Date Medicare IM given:   Medicare IM given by:   Date Additional Medicare IM given:   Additional Medicare IM given by:    Discharge Disposition:    Per UR Regulation:    If discussed at Long Length of Stay Meetings, dates discussed:    Comments:  07/02/14 Edwyna Shell RN BSN CM [phone]  Patient lives with mother and mother drives patient's to appointments. Patient stated she will start to follow [place] and perhaps [place] but has not had a PCP. Provided patient with [facility] pamphlet and explained that the clinic accepts hospital follow up patients Mon-Fri at 9:00 am on a walk-in basis, to bring ID, and DC paperwork. [place] stated that the patient will be seen and once established can then make follow-up appointments. Discussed meeting with financial counselor at Clinic for financial resources.

## 2014-07-03 ENCOUNTER — Other Ambulatory Visit: Payer: Self-pay | Admitting: Internal Medicine

## 2014-07-03 DIAGNOSIS — C3490 Malignant neoplasm of unspecified part of unspecified bronchus or lung: Secondary | ICD-10-CM | POA: Insufficient documentation

## 2014-07-03 DIAGNOSIS — C3491 Malignant neoplasm of unspecified part of right bronchus or lung: Secondary | ICD-10-CM

## 2014-07-03 DIAGNOSIS — Z87891 Personal history of nicotine dependence: Secondary | ICD-10-CM

## 2014-07-03 DIAGNOSIS — C3411 Malignant neoplasm of upper lobe, right bronchus or lung: Principal | ICD-10-CM

## 2014-07-03 DIAGNOSIS — D509 Iron deficiency anemia, unspecified: Secondary | ICD-10-CM

## 2014-07-03 DIAGNOSIS — J189 Pneumonia, unspecified organism: Secondary | ICD-10-CM

## 2014-07-03 DIAGNOSIS — R918 Other nonspecific abnormal finding of lung field: Secondary | ICD-10-CM

## 2014-07-03 LAB — CBC
HEMATOCRIT: 26.9 % — AB (ref 36.0–46.0)
Hemoglobin: 8.3 g/dL — ABNORMAL LOW (ref 12.0–15.0)
MCH: 25.4 pg — ABNORMAL LOW (ref 26.0–34.0)
MCHC: 30.9 g/dL (ref 30.0–36.0)
MCV: 82.3 fL (ref 78.0–100.0)
Platelets: 380 10*3/uL (ref 150–400)
RBC: 3.27 MIL/uL — ABNORMAL LOW (ref 3.87–5.11)
RDW: 15.4 % (ref 11.5–15.5)
WBC: 8.9 10*3/uL (ref 4.0–10.5)

## 2014-07-03 LAB — BASIC METABOLIC PANEL
Anion gap: 5 (ref 5–15)
BUN: 12 mg/dL (ref 6–23)
CALCIUM: 8.2 mg/dL — AB (ref 8.4–10.5)
CO2: 25 mmol/L (ref 19–32)
CREATININE: 0.52 mg/dL (ref 0.50–1.10)
Chloride: 110 mmol/L (ref 96–112)
GFR calc Af Amer: >90 mL/min (ref >90)
GFR calc non Af Amer: >90 mL/min (ref >90)
GLUCOSE: 90 mg/dL (ref 70–99)
Potassium: 3.8 mmol/L (ref 3.5–5.1)
Sodium: 140 mmol/L (ref 135–145)

## 2014-07-03 LAB — GLUCOSE, CAPILLARY: Glucose-Capillary: 80 mg/dL (ref 70–99)

## 2014-07-03 MED ORDER — PANTOPRAZOLE SODIUM 40 MG PO TBEC: 40.0000 mg | DELAYED_RELEASE_TABLET | Freq: Every day | ORAL | Status: DC

## 2014-07-03 MED ORDER — DOCUSATE SODIUM 100 MG PO CAPS: 100.0000 mg | ORAL_CAPSULE | Freq: Two times a day (BID) | ORAL | Status: DC

## 2014-07-03 MED ORDER — DEXAMETHASONE 4 MG PO TABS: 4.0000 mg | ORAL_TABLET | Freq: Two times a day (BID) | ORAL | Status: DC

## 2014-07-03 MED ORDER — IBUPROFEN 600 MG PO TABS: 600.0000 mg | ORAL_TABLET | Freq: Four times a day (QID) | ORAL | Status: DC

## 2014-07-03 MED ORDER — POLYSACCHARIDE IRON COMPLEX 150 MG PO CAPS: 150.0000 mg | ORAL_CAPSULE | Freq: Every day | ORAL | Status: DC

## 2014-07-03 MED ORDER — MORPHINE SULFATE 15 MG PO TABS: 15.0000 mg | ORAL_TABLET | ORAL | Status: DC | PRN

## 2014-07-03 NOTE — Discharge Summary (Signed)
Physician Discharge Summary  Barbara Frost MAU:[id number] DOB: [birth year] DOA: 06/28/2014  PCP: [name], MD  Admit date: 06/28/2014 Discharge date: 07/03/2014  Time spent:  minutes  Recommendations for Outpatient Follow-up:  1. Follow up with [name], MD in 1 week. Patient will need a CBC to follow-up on H&H as well as a basic metabolic profile. 2. Follow up with Dr Earlie Server as scheduled for further workup on lung CA.  Discharge Diagnoses:  Principal Problem:   Pulmonary mass Active Problems:   Lobar pneumonia: CAP   Leukocytosis   Anemia   Thrombocytosis   Tobacco abuse   Chest pain on respiration   Lung mass   Discharge Condition: stable  Diet recommendation: regular  Filed Weights   06/30/14 0534 07/01/14 0600 07/03/14 0500  Weight: 56.7 kg (125 lb) 54.749 kg (120 lb 11.2 oz) 54.75 kg (120 lb 11.2 oz)    History of present illness:  Barbara Frost is a 44 y.o. female  With history of extensive tobacco use with 30-pack-year history otherwise previously healthy female who presented to the ED with complaints of right chest pain sharp in nature radiating to the right upper back worse with inspiration and laying flat, worsening shortness of breath, nonproductive cough with hemoptysis one week prior to admission. Patient endorsed some wheezing, fever with a temperature 101, chills, diaphoresis, weakness, generalized fatigue. Patient denied any nausea, did endorse an episode of emesis after coughing. Patient endorsed decreased oral intake. Patient denied any melena, no hematemesis, no hematochezia, no constipation, no diarrhea, no dysuria, no abdominal pain. Patient does endorse some diaphoresis and it 3-5 pound weight loss over the past 3 weeks. Patient denied any lower extremity swelling no recent surgeries, no recent long car rides, no history of prior DVT. Patient stated recently completed a 6 day course of Z-Pak which was prescribed to her per her PCP. Patient was seen in  emergency room, basic metabolic profile done had a sodium of 133 otherwise was within normal limits. Point-of-care troponin was negative. CBC had a white count of 11.3 hemoglobin of 10.7 platelet count of 559 otherwise was within normal limits. EKG done showed normal sinus rhythm with low voltage. Chest x-ray showed complete opacification of the right upper lobe which may reflect pneumonia with partial collapse. CT of the chest, was negative for PE. Showed obstruction of the right upper lobe bronchus with extensive consolidation. Suspect mass with postobstructive pneumonitis. 4 mm nodular opacity anterior segment of the right lower lobe. Underlying emphysematous change, hepatic steatosis. [facility] were called to admit the patient for further evaluation and management  Hospital Course:  #1 pulmonary mass Patient was admitted with concerns for pulmonary mass. Pulmonic consultation was obtained. Patient was been seen by pulmonary and patient underwent CT-guided biopsy of the mass per interventional radiology. 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 Patient was placed on some Mucinex and nebulizer treatments. Oncology consultation was obtained and patient was seen in consultation by Dr. Lorna Few. Patient will follow-up as outpatient for staging. Patient be discharged on 4 mg of Decadron twice daily to help with swelling. Patient will follow-up with oncology and PCP as outpatient.   #2 Questionable postobstructive pneumonitis/lobar pneumonia Pro calcitonin and lactic acid levels were within normal limits. Patient afebrile. Blood cultures pending with no growth to date. IV antibiotics have been discontinued. Follow.  #3 leukocytosis Likely secondary to problem #1. Patient has been pancultured results are negative to date. IV Levaquin was discontinued. Pro calcitonin  and lactic acid levels within normal limits. WBC trended down and  had resolved by day of discharge.   #4 iron deficiency anemia Patient with a iron deficiency anemia in a menstruating female. Patient was transfused IV iron. Patient be discharged on oral iron supplementation. status post IV iron. Will likely need oral iron tablets.   #5 tobacco abuse Tobacco cessation. Patient states nicotine patch made her jittery and as such removed it.  #6 chest pain on respiration Likely secondary to problem #1. Pain management.  #7 thrombocytosis Maybe secondary to problem #1. Trending down. Follow.  Procedures:  Ct ANGIOGRAM CHEST 06/28/2014  Chest x-ray 06/28/2014  CT chest biopsy per IR 06/29/14    Consultations:  Oncology:Dr Mohammed 07/03/14  PCCM Dr Chase Caller 06/28/14  Discharge Exam: Filed Vitals:   07/03/14 1721  BP: 128/81  Pulse: 64  Temp:   Resp:     General: NAD Cardiovascular: RRR Respiratory: CTAB  Discharge Instructions   Discharge Instructions    Diet general    Complete by:  As directed      Discharge instructions    Complete by:  As directed   Follow up with Dr Earlie Server as scheduled.     Increase activity slowly    Complete by:  As directed           Current Discharge Medication List    START taking these medications   Details  dexamethasone (DECADRON) 4 MG tablet Take 1 tablet (4 mg total) by mouth 2 (two) times daily. Qty: 30 tablet, Refills: 0    docusate sodium (COLACE) 100 MG capsule Take 1 capsule (100 mg total) by mouth 2 (two) times daily. Qty: 10 capsule, Refills: 0    ibuprofen (ADVIL,MOTRIN) 600 MG tablet Take 1 tablet (600 mg total) by mouth 4 (four) times daily. Take for 4 days then use as needed. Qty: 30 tablet, Refills: 0    iron polysaccharides (NIFEREX) 150 MG capsule Take 1 capsule (150 mg total) by mouth daily. Qty: 30 capsule, Refills: 0    morphine (MSIR) 15 MG tablet Take 1 tablet (15 mg total) by mouth every 4 (four) hours as needed for severe pain. Qty: 30 tablet, Refills: 0     pantoprazole (PROTONIX) 40 MG tablet Take 1 tablet (40 mg total) by mouth daily at 6 (six) AM. Qty: 30 tablet, Refills: 0      CONTINUE these medications which have NOT CHANGED   Details  aspirin 325 MG EC tablet Take 325 mg by mouth every 6 (six) hours as needed for pain.    dextromethorphan (DELSYM) 30 MG/5ML liquid Take 30 mg by mouth as needed for cough.    guaiFENesin (ROBITUSSIN) 100 MG/5ML liquid Take 200 mg by mouth 3 (three) times daily as needed for cough.    vitamin C (ASCORBIC ACID) 500 MG tablet Take 500 mg by mouth daily.      STOP taking these medications     dextromethorphan-guaiFENesin (MUCINEX DM) 30-600 MG per 12 hr tablet        No Known Allergies Follow-up Information    Follow up with GARBA,LAWAL, MD. Schedule an appointment as soon as possible for a visit in 1 week.   Specialty:  Internal Medicine   Contact information:   [address] [place] [address] [phone]       Follow up with Eilleen Kempf., MD.   Specialty:  Oncology   Why:  f/u as scheduled.   Contact information:   [address]  [place] [address] [phone]        The results of significant diagnostics from this hospitalization (including imaging, microbiology, ancillary and laboratory) are listed below for reference.    Significant Diagnostic Studies: Dg Chest 1 View  06/29/2014   CLINICAL DATA:  Evaluate for pneumothorax after biopsy.  EXAM: CHEST  1 VIEW  COMPARISON:  06/28/2014 CT and plain film.  FINDINGS: Large right upper lobe mass/ opacity again noted, unchanged. No pneumothorax. No effusion. Left lung is clear. Heart is normal size.  IMPRESSION: No pneumothorax following right upper lobe mass biopsy.   Electronically Signed   By: Rolm Baptise M.D.   On: 06/29/2014 19:19   Dg Chest 2 View  06/28/2014   CLINICAL DATA:  Cough for 3 weeks. Chest pain. Shortness of breath.  EXAM: CHEST  2 VIEW  COMPARISON:  None.  FINDINGS: Complete opacification of  the right upper lobe with questionable partial collapse. Right lower lung zone is clear. The left lung is clear. The heart size is normal. There is no pleural effusion. No pneumothorax. No acute osseous abnormalities are seen.  IMPRESSION: Complete opacification of the right upper lobe. This may reflect pneumonia with partial collapse. Chest CT with contrast recommended for further evaluation to evaluate for/exclude central obstructing lesion.   Electronically Signed   By: Jeb Levering M.D.   On: 06/28/2014 05:57   Ct Angio Chest Pe W/cm &/or Wo Cm  06/28/2014   CLINICAL DATA:  Shortness of breath and wheezing; hemoptysis  EXAM: CT ANGIOGRAPHY CHEST WITH CONTRAST  TECHNIQUE: Multidetector CT imaging of the chest was performed using the standard protocol during bolus administration of intravenous contrast. Multiplanar CT image reconstructions and MIPs were obtained to evaluate the vascular anatomy.  CONTRAST:  117mL OMNIPAQUE IOHEXOL 350 MG/ML SOLN  COMPARISON:  Chest radiograph June 28, 2014  FINDINGS: There is no demonstrable pulmonary embolus. There is no thoracic aortic aneurysm or dissection.  There is obstruction of the right upper lobe bronchus. There is consolidation throughout most of the right upper lobe.  There is underlying emphysematous change with scattered small bullae throughout the aerated lungs. On axial slice 63 series 7, there is a 4 mm nodular opacity in the anterior segment of the right lower lobe. There is no appreciable thoracic adenopathy. The pericardium is not thickened.  In the visualized upper abdomen, there is hepatic steatosis.  There are no blastic or lytic bone lesions. There is a 5 mm nodular opacity in the anterior aspect of the right lobe of the thyroid.  Review of the MIP images confirms the above findings.  IMPRESSION: No demonstrable pulmonary embolus.  Obstruction of the right upper lobe bronchus with extensive consolidation. Suspect mass with postobstructive  pneumonitis. It is not possible to separate mass from consolidation in the right upper lobe. This finding may well warrant bronchoscopy to further assess.  4 mm nodular opacity anterior segment right lower lobe. This finding could represent a small metastasis.  Underlying emphysematous change.  No adenopathy.  Hepatic steatosis.   Electronically Signed   By: Lowella Grip III M.D.   On: 06/28/2014 07:57   Ct Biopsy  06/29/2014   CLINICAL DATA:  44 year old female with right upper lung mass. She has been referred for percutaneous biopsy.  EXAM: CT GUIDED BIOPSY BIOPSY OF RIGHT LUNG MASS  ANESTHESIA/SEDATION: 3.0  Mg IV Versed; 50 mcg IV Fentanyl  Total Moderate Sedation Time: 14 minutes.  PROCEDURE: The procedure risks, benefits, and alternatives were  explained to the patient. Questions regarding the procedure were encouraged and answered. The patient understands and consents to the procedure.  CT of the chest performed for planning purposes.  The right anterior chest was prepped with Betadinein a sterile fashion, and a sterile drape was applied covering the operative field. A sterile gown and sterile gloves were used for the procedure. Local anesthesia was provided with 1% Lidocaine.  Once the patient is prepped and draped sterilely in the skin and subcutaneous tissues were infiltrated with 1% lidocaine for local anesthesia. A 17 gauge guide needle was then advanced through the chest wall into the mass of the right upper lobe. Four separate 18 gauge biopsy were retrieved. These are placed in formalin.  The needle was removed and a final CT study was performed.  No complicating feature for identified.  The patient tolerated the procedure well and remained hemodynamically stable throughout.  No complications were encountered and no significant blood loss was encountered.  COMPLICATIONS: None  FINDINGS: Right upper lobe mass, similar to the prior CT.  There are some ill-defined density central as within the  mass. These were targeted with the needle, as the mass is difficult to differentiate from overlying atelectatic lung.  Status post biopsy, no complicating features were identified.  IMPRESSION: Status post CT-guided biopsy of right upper lobe lung mass. Tissue specimen sent to pathology for complete histopathologic analysis.  Signed,  Dulcy Fanny. Earleen Newport, DO  [facility]  [facility]   Electronically Signed   By: Corrie Mckusick D.O.   On: 06/29/2014 18:48    Microbiology: Recent Results (from the past 240 hour(s))  Culture, blood (routine x 2) Call MD if unable to obtain prior to antibiotics being given     Status: None (Preliminary result)   Collection Time: 06/28/14  9:14 AM  Result Value Ref Range Status   Specimen Description BLOOD LEFT ARM  Final   Special Requests   Final    BOTTLES DRAWN AEROBIC AND ANAEROBIC 5CC BOTH BOTTLES   Culture   Final           BLOOD CULTURE RECEIVED NO GROWTH TO DATE CULTURE WILL BE HELD FOR 5 DAYS BEFORE ISSUING A FINAL NEGATIVE REPORT Performed at [facility]    Report Status PENDING  Incomplete  Culture, blood (routine x 2) Call MD if unable to obtain prior to antibiotics being given     Status: None (Preliminary result)   Collection Time: 06/28/14  9:15 AM  Result Value Ref Range Status   Specimen Description BLOOD LEFT HAND  Final   Special Requests   Final    BOTTLES DRAWN AEROBIC AND ANAEROBIC 5CC BOTH BOTTLES   Culture   Final           BLOOD CULTURE RECEIVED NO GROWTH TO DATE CULTURE WILL BE HELD FOR 5 DAYS BEFORE ISSUING A FINAL NEGATIVE REPORT Performed at [facility]    Report Status PENDING  Incomplete  Culture, Urine     Status: None   Collection Time: 06/28/14 12:24 PM  Result Value Ref Range Status   Specimen Description URINE, CLEAN CATCH  Final   Special Requests NONE  Final   Colony Count NO GROWTH Performed at [facility]   Final   Culture NO GROWTH Performed  at [facility]   Final   Report Status 06/29/2014 FINAL  Final  Surgical pcr screen     Status: None   Collection Time:  06/29/14  4:23 AM  Result Value Ref Range Status   MRSA, PCR NEGATIVE NEGATIVE Final   Staphylococcus aureus NEGATIVE NEGATIVE Final    Comment:        The Xpert SA Assay (FDA approved for NASAL specimens in patients over 34 years of age), is one component of a comprehensive surveillance program.  Test performance has been validated by [facility] for patients greater than or equal to 61 year old. It is not intended to diagnose infection nor to guide or monitor treatment.      Labs: Basic Metabolic Panel:  Recent Labs Lab 06/28/14 1434 06/29/14 0600 06/30/14 0541 07/01/14 0523 07/02/14 0530 07/03/14 0540  NA  --  139 139 137 139 140  K  --  4.0 4.4 4.0 4.3 3.8  CL  --  105 106 104 106 110  CO2  --  24 26 28 27 25   GLUCOSE  --  92 102* 97 98 90  BUN  --  8 9 6 10 12   CREATININE 0.58 0.59 0.62 0.56 0.58 0.52  CALCIUM  --  8.6 8.1* 8.7 8.6 8.2*  MG 2.1  --   --   --   --   --    Liver Function Tests:  Recent Labs Lab 06/28/14 0914 06/29/14 0600  AST 19 16  ALT 11 11  ALKPHOS 57 45  BILITOT 0.4 0.4  PROT 7.4 6.6  ALBUMIN 3.1* 2.7*   No results for input(s): LIPASE, AMYLASE in the last 168 hours. No results for input(s): AMMONIA in the last 168 hours. CBC:  Recent Labs Lab 06/28/14 0543  06/29/14 0600 06/30/14 0541 06/30/14 1337 07/01/14 0523 07/02/14 0530 07/03/14 0540  WBC 11.3*  < > 9.2 7.9  --  8.8 8.3 8.9  NEUTROABS 7.8*  --   --   --   --   --   --   --   HGB 10.7*  < > 8.7* 8.1* 8.6* 9.0* 9.1* 8.3*  HCT 35.1*  < > 27.9* 26.8* 27.3* 28.4* 29.2* 26.9*  MCV 82.0  < > 80.9 83.0  --  81.8 81.8 82.3  PLT 559*  < > 407* 423*  --  442* 404* 380  < > = values in this interval not displayed. Cardiac Enzymes: No results for input(s): CKTOTAL, CKMB, CKMBINDEX, TROPONINI in the last 168 hours. BNP: BNP (last 3  results) No results for input(s): BNP in the last 8760 hours.  ProBNP (last 3 results) No results for input(s): PROBNP in the last 8760 hours.  CBG:  Recent Labs Lab 06/29/14 0757 06/30/14 0756 07/01/14 0741 07/02/14 0757 07/03/14 0719  GLUCAP 92 80 92 99 80       Signed:  Ladona Rosten MD [facility] 07/03/2014, 5:59 PM

## 2014-07-03 NOTE — Consult Note (Signed)
[place] [phone]   [phone]  CONSULT NOTE  REFERRING PHYSICIAN: Dr. Irine Seal  REASON FOR CONSULTATION:  44 years old white female recently diagnosed with lung cancer.  HPI Barbara Frost is a 44 y.o. female with no significant past medical history except for iron deficiency anemia and long history of smoking. The patient has been complaining of increasing shortness of breath and chest congestion as well as right sided chest pain for a few weeks. Her symptoms were getting worse and she started having a nonproductive cough with occasional hemoptysis 1 week before her admission to the hospital. She presented to [facility] complaining of fever, cough as well as shortness of breath and generalized weakness and fatigue. During her evaluation chest x-ray was performed on 06/28/2014 and it showed complete opacification of the right upper lobe questionable for pneumonia with partial collapse and CT scan of the chest was recommended. CT angiogram of the chest was performed on 06/28/2014 and it showed no evidence for pulmonary embolism but there was obstruction of the right upper lobe bronchus with extensive consolidation and suspicious mass with postobstructive pneumonitis. It was not possible to separate the mass from the consolidation in the right upper lobe. There was also 4 mm nodular opacity in the anterior segment of the right lower lobe concerning for small metastasis. No adenopathy was detected. 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 There is abundant necrotic debris. There are foci of poorly differentiated neoplastic cells. Immunohistochemistry reveals the neoplastic cells are positive for p63 (strong), cytokeratin 5/6, and cytokeratin 7. They are negative for TTF-1  and cytokeratin 20. Overall, these findings are consistent with a poorly differentiated squamous cell carcinoma. Dr. Grandville Silos kindly ask of me to see the patient today for evaluation and recommendation regarding treatment of her condition. When seen today she is feeling fine except for the shortness of breath and cough with occasional hemoptysis and occasional right-sided chest pain. She denied having any significant nausea or vomiting, no fever or chills. The patient denied having any significant headache or blurry vision. She lost few pounds recently. Family history significant for mother and father with heart disease. The patient is single and was accompanied at the bedside by her mother and boyfriend Barbara Frost. The patient is currently unemployed and she takes care of her mother. She has a history of smoking 1 pack per day for around 30 years and quit recently before admission to the hospital. No history of alcohol or drug abuse.  HPI  History reviewed. No pertinent past medical history.  Past Surgical History  Procedure Laterality Date  . Femur closed reduction      Family History  Problem Relation Age of Onset  . CAD Mother   . CAD Father     Social History History  Substance Use Topics  . Smoking status: Current Every Day Smoker -- 1.00 packs/day for 30 years    Types: Cigarettes  . Smokeless tobacco: Never Used  . Alcohol Use: No     Comment: socially    No Known Allergies  Current Facility-Administered Medications  Medication Dose Route Frequency Provider Last Rate Last Dose  . acetaminophen (TYLENOL) tablet 650 mg  650 mg Oral Q4H PRN Eugenie Filler, MD   650 mg at 06/29/14 0543  . dextromethorphan-guaiFENesin (MUCINEX DM) 30-600 MG per 12 hr tablet  2 tablet  2 tablet Oral BID Eugenie Filler, MD   2 tablet at 07/03/14 [phone]  . docusate sodium (COLACE) capsule 100 mg  100 mg Oral BID Eugenie Filler, MD   100 mg at 07/03/14 0853  . gi cocktail  (Maalox,Lidocaine,Donnatal)  30 mL Oral TID PRN Eugenie Filler, MD      . ibuprofen (ADVIL,MOTRIN) tablet 600 mg  600 mg Oral QID Eugenie Filler, MD   600 mg at 07/03/14 1654  . ipratropium-albuterol (DUONEB) 0.5-2.5 (3) MG/3ML nebulizer solution 3 mL  3 mL Nebulization Q2H PRN Irine Seal V, MD      . iron polysaccharides (NIFEREX) capsule 150 mg  150 mg Oral Daily Eugenie Filler, MD   150 mg at 07/03/14 0853  . morphine 2 MG/ML injection 2 mg  2 mg Intravenous Q4H PRN Eugenie Filler, MD      . nicotine (NICODERM CQ - dosed in mg/24 hours) patch 21 mg  21 mg Transdermal Daily Eugenie Filler, MD   21 mg at 06/28/14 1139  . ondansetron (ZOFRAN) injection 4 mg  4 mg Intravenous Q6H PRN Eugenie Filler, MD      . oxyCODONE-acetaminophen (PERCOCET/ROXICET) 5-325 MG per tablet 1-2 tablet  1-2 tablet Oral Q4H PRN Eugenie Filler, MD   2 tablet at 07/03/14 1732  . pantoprazole (PROTONIX) EC tablet 40 mg  40 mg Oral Q0600 Eugenie Filler, MD   40 mg at 07/03/14 0516  . polyethylene glycol (MIRALAX / GLYCOLAX) packet 17 g  17 g Oral Daily PRN Eugenie Filler, MD      . sorbitol 70 % solution 30 mL  30 mL Oral Daily PRN Eugenie Filler, MD        Review of Systems  Constitutional: positive for fatigue and weight loss Eyes: negative Ears, nose, mouth, throat, and face: negative Respiratory: positive for cough, dyspnea on exertion and hemoptysis Cardiovascular: negative Gastrointestinal: negative Genitourinary:negative Integument/breast: negative Hematologic/lymphatic: negative Musculoskeletal:negative Neurological: negative Behavioral/Psych: negative Endocrine: negative Allergic/Immunologic: negative  Physical Exam  IEP:PIRJJ, healthy, no distress, well nourished and well developed SKIN: skin color, texture, turgor are normal, no rashes or significant lesions HEAD: Normocephalic, No masses, lesions, tenderness or abnormalities EYES: normal, PERRLA EARS: External  ears normal, Canals clear OROPHARYNX:no exudate, no erythema and lips, buccal mucosa, and tongue normal  NECK: supple, no adenopathy, no JVD LYMPH:  no palpable lymphadenopathy, no hepatosplenomegaly BREAST:not examined LUNGS: decreased breath sounds, expiratory wheezes bilaterally HEART: regular rate & rhythm, no murmurs and no gallops ABDOMEN:abdomen soft, non-tender, normal bowel sounds and no masses or organomegaly BACK: Back symmetric, no curvature., No CVA tenderness EXTREMITIES:no joint deformities, effusion, or inflammation, no edema, no skin discoloration  NEURO: alert & oriented x 3 with fluent speech, no focal motor/sensory deficits  PERFORMANCE STATUS: ECOG 1  LABORATORY DATA: Lab Results  Component Value Date   WBC 8.9 07/03/2014   HGB 8.3* 07/03/2014   HCT 26.9* 07/03/2014   MCV 82.3 07/03/2014   PLT 380 07/03/2014    @LASTCHEM @  RADIOGRAPHIC STUDIES: Dg Chest 1 View  06/29/2014   CLINICAL DATA:  Evaluate for pneumothorax after biopsy.  EXAM: CHEST  1 VIEW  COMPARISON:  06/28/2014 CT and plain film.  FINDINGS: Large right upper lobe mass/ opacity again noted, unchanged. No pneumothorax. No effusion. Left lung is clear. Heart is normal size.  IMPRESSION: No pneumothorax following right upper lobe mass biopsy.   Electronically Signed   By:  Rolm Baptise M.D.   On: 06/29/2014 19:19   Dg Chest 2 View  06/28/2014   CLINICAL DATA:  Cough for 3 weeks. Chest pain. Shortness of breath.  EXAM: CHEST  2 VIEW  COMPARISON:  None.  FINDINGS: Complete opacification of the right upper lobe with questionable partial collapse. Right lower lung zone is clear. The left lung is clear. The heart size is normal. There is no pleural effusion. No pneumothorax. No acute osseous abnormalities are seen.  IMPRESSION: Complete opacification of the right upper lobe. This may reflect pneumonia with partial collapse. Chest CT with contrast recommended for further evaluation to evaluate for/exclude central  obstructing lesion.   Electronically Signed   By: Jeb Levering M.D.   On: 06/28/2014 05:57   Ct Angio Chest Pe W/cm &/or Wo Cm  06/28/2014   CLINICAL DATA:  Shortness of breath and wheezing; hemoptysis  EXAM: CT ANGIOGRAPHY CHEST WITH CONTRAST  TECHNIQUE: Multidetector CT imaging of the chest was performed using the standard protocol during bolus administration of intravenous contrast. Multiplanar CT image reconstructions and MIPs were obtained to evaluate the vascular anatomy.  CONTRAST:  150mL OMNIPAQUE IOHEXOL 350 MG/ML SOLN  COMPARISON:  Chest radiograph June 28, 2014  FINDINGS: There is no demonstrable pulmonary embolus. There is no thoracic aortic aneurysm or dissection.  There is obstruction of the right upper lobe bronchus. There is consolidation throughout most of the right upper lobe.  There is underlying emphysematous change with scattered small bullae throughout the aerated lungs. On axial slice 63 series 7, there is a 4 mm nodular opacity in the anterior segment of the right lower lobe. There is no appreciable thoracic adenopathy. The pericardium is not thickened.  In the visualized upper abdomen, there is hepatic steatosis.  There are no blastic or lytic bone lesions. There is a 5 mm nodular opacity in the anterior aspect of the right lobe of the thyroid.  Review of the MIP images confirms the above findings.  IMPRESSION: No demonstrable pulmonary embolus.  Obstruction of the right upper lobe bronchus with extensive consolidation. Suspect mass with postobstructive pneumonitis. It is not possible to separate mass from consolidation in the right upper lobe. This finding may well warrant bronchoscopy to further assess.  4 mm nodular opacity anterior segment right lower lobe. This finding could represent a small metastasis.  Underlying emphysematous change.  No adenopathy.  Hepatic steatosis.   Electronically Signed   By: Lowella Grip III M.D.   On: 06/28/2014 07:57   Ct  Biopsy  06/29/2014   CLINICAL DATA:  44 year old female with right upper lung mass. She has been referred for percutaneous biopsy.  EXAM: CT GUIDED BIOPSY BIOPSY OF RIGHT LUNG MASS  ANESTHESIA/SEDATION: 3.0  Mg IV Versed; 50 mcg IV Fentanyl  Total Moderate Sedation Time: 14 minutes.  PROCEDURE: The procedure risks, benefits, and alternatives were explained to the patient. Questions regarding the procedure were encouraged and answered. The patient understands and consents to the procedure.  CT of the chest performed for planning purposes.  The right anterior chest was prepped with Betadinein a sterile fashion, and a sterile drape was applied covering the operative field. A sterile gown and sterile gloves were used for the procedure. Local anesthesia was provided with 1% Lidocaine.  Once the patient is prepped and draped sterilely in the skin and subcutaneous tissues were infiltrated with 1% lidocaine for local anesthesia. A 17 gauge guide needle was then advanced through the chest wall into the mass of  the right upper lobe. Four separate 18 gauge biopsy were retrieved. These are placed in formalin.  The needle was removed and a final CT study was performed.  No complicating feature for identified.  The patient tolerated the procedure well and remained hemodynamically stable throughout.  No complications were encountered and no significant blood loss was encountered.  COMPLICATIONS: None  FINDINGS: Right upper lobe mass, similar to the prior CT.  There are some ill-defined density central as within the mass. These were targeted with the needle, as the mass is difficult to differentiate from overlying atelectatic lung.  Status post biopsy, no complicating features were identified.  IMPRESSION: Status post CT-guided biopsy of right upper lobe lung mass. Tissue specimen sent to pathology for complete histopathologic analysis.  Signed,  Dulcy Fanny. Earleen Newport, DO  [facility]  [facility]  Radiology   Electronically Signed   By: Corrie Mckusick D.O.   On: 06/29/2014 18:48    ASSESSMENT: This is a very pleasant 44 years old white female recently diagnosed with at least locally advanced, stage IIIA non-small cell lung cancer, squamous cell carcinoma presented with large obstructing right upper lobe lung mass and questionable satellite metastatic lesion in a patient with long history of smoking.   PLAN: I had a lengthy discussion with the patient and her family today about her current disease condition and treatment options. I recommended for the patient to complete the staging workup by ordering a MRI of the brain as well as PET scan on outpatient basis after discharge. The patient is expected to be discharged from the hospital later today. I will arrange for the patient a follow-up appointment with me in few days for more detailed discussion of her treatment options which would be likely in the form of a course of concurrent chemoradiation as she has no evidence of other metastatic disease. I will also refer the patient to radiation oncology for discussion of the radiotherapy option. Because of the large obstructing mass, the patient may benefit from treatment with Decadron 4 mg by mouth twice a day for the next 1-2 weeks until we start the treatment with concurrent chemoradiation. For the iron deficiency anemia, she will continue on Niferex 100 mg by mouth twice a day. The patient was advised to call immediately if she has any concerning symptoms in the interval.  The patient voices understanding of current disease status and treatment options and is in agreement with the current care plan.  All questions were answered. The patient knows to call the clinic with any problems, questions or concerns. We can certainly see the patient much sooner if necessary.  Thank you so much for allowing me to participate in the care of Rulon Abide. I will continue to follow up the patient with you  and assist in her care.  Disclaimer: This note was dictated with voice recognition software. Similar sounding words can inadvertently be transcribed and may not be corrected upon review.   Hyla Coard K. July 03, 2014, 5:34 PM

## 2014-07-03 NOTE — Progress Notes (Signed)
All d/c forms completed and given to pt. Waiting for prescriptions to be sign by md

## 2014-07-04 LAB — CULTURE, BLOOD (ROUTINE X 2)
Culture: NO GROWTH
Culture: NO GROWTH

## 2014-07-04 NOTE — Progress Notes (Addendum)
Thoracic Location of Tumor / Histology: Right Upper Lobe, Poorly Differentiated Squamous Cell Carcinoma with Necrosis  Barbara Frost is a 44 y.o. Previously healthy female, who presents to the ED with complaints of 3 weeks of right-sided chest pain that began gradually and has persisted, reporting that it became worse last night. She reports 6/10 constant sharp pain that radiates to the right upper back, worse with inspiration and laying flat, improved somewhat with hydrocodone, and associated with cough with hemoptysis, wheezing, and shortness of breath. Additional symptoms include intermittent numbness of the right arm currently resolved, and 3 pillow orthopnea (typically uses 1 pillow but needs 3 now to be comfortable in bed).   06/28/14 CT Angio revealed: Obstruction of the right upper lobe bronchus with extensiveconsolidation. Suspect mass with postobstructive pneumonitis. It is not possible to separate mass from consolidation in the right upper lobe. This finding may well warrant bronchoscopy to further assess. 4 mm nodular opacity anterior segment right lower lobe. This finding could represent a small metastasis. Underlying emphysematous change. No adenopathy.  Biopsies of Right Upper Lobe (if applicable) revealed:   06/29/14 Diagnosis Lung, needle/core biopsy(ies) - POORLY DIFFERENTIATED SQUAMOUS CELL CARCINOMA WITH NECROSIS  Tobacco/Marijuana/Snuff/ETOH use: 1 PPD x 30 years- Stopped smoking on 06/26/14, No Alcohol or Drugs  Past/Anticipated interventions by cardiothoracic surgery, if any: Biopsy of right Upper Lobe  Past/Anticipated interventions by medical oncology, if any: Dr. Curt Bears - 07/06/14 appointment  Signs/Symptoms  Weight changes, if any: Gained 3 lbs since 05/23/14.  Only eats a meal once daily and eats fruit cups, oranges, pudding and icecream   Respiratory complaints, if any: SOB when ambulating   Hemoptysis, if any: Yes, on original presentation, but has  resolved  Pain issues, if any:  Right Upper Back - Today No pain since taking meds.  Cough- Intermittent- Delsym or Robitussin  Headache for 2 weeks in the bilateral temple regions and posterior skull with intermittent dizziness while sitting and ambulating  SAFETY ISSUES:  Prior radiation? No  Pacemaker/ICD? No   Possible current pregnancy? No  Is the patient on methotrexate? No  Current Complaints / other details:    Night Sweats x 1 month

## 2014-07-05 ENCOUNTER — Other Ambulatory Visit: Payer: Self-pay | Admitting: *Deleted

## 2014-07-05 ENCOUNTER — Encounter: Payer: Self-pay | Admitting: Radiation Oncology

## 2014-07-05 ENCOUNTER — Ambulatory Visit
Admission: RE | Admit: 2014-07-05 | Discharge: 2014-07-05 | Disposition: A | Discharge status: Home / Self Care | Payer: Medicaid Other | Source: Ambulatory Visit | Attending: Radiation Oncology | Admitting: Radiation Oncology

## 2014-07-05 VITALS — BP 117/73 | HR 58 | Temp 97.6°F | Resp 16 | Ht 60.0 in | Wt 123.9 lb

## 2014-07-05 DIAGNOSIS — Z87891 Personal history of nicotine dependence: Secondary | ICD-10-CM | POA: Insufficient documentation

## 2014-07-05 DIAGNOSIS — C3491 Malignant neoplasm of unspecified part of right bronchus or lung: Secondary | ICD-10-CM | POA: Diagnosis present

## 2014-07-05 HISTORY — DX: Malignant neoplasm of unspecified part of right bronchus or lung: C34.91

## 2014-07-05 NOTE — Progress Notes (Signed)
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  Stage pending PET scan and brain scan  HISTORY OF PRESENT ILLNESS:  Barbara Frost is a 44 y.o. female who is seen today through the courtesy of Dr. Inda Merlin for consideration of radiation therapy in the management of her non-small cell carcinoma of the right lung.  She presented to the emergency department with a three-week history of right-sided chest discomfort which she described as being sharp and radiating to the upper back.  She also had 2 episodes of hemoptysis after severe coughing.  She has significant dyspnea on exertion but really none at rest.  On 06/28/2014 she had a CT scan which showed obstruction of the right upper lobe bronchus with extensive consolidation.  There was felt to be postobstructive pneumonitis and it was not possible to separate mass from consolidation of the right upper lobe.  A 4 mm nodular opacity was seen along the anterior segment of the right lower lobe, possibly representing a small metastasis.  She did not undergo bronchoscopy.  A needle core biopsy on 06/29/2014 was diagnostic for poorly differentiated squamous cell carcinoma.  She was seen by Dr. Julien Nordmann who has her seen today for consideration of radiation therapy.  Dr. Julien Nordmann started her on dexamethasone to hopefully prevent further airway collapse.  She states that her appetite is poor to at best fair.  She has lost 10 pounds over the past month.  Her pain was reasonably controlled with Percocet while an inpatient, but her current morphine immediate release tablets do not control her pain as well.  She will see Dr. Inda Merlin tomorrow, she  tells me she will have her PET scan tomorrow as well.  A brain MRI is also scheduled.  PREVIOUS RADIATION THERAPY: No   PAST MEDICAL HISTORY:  has a past medical history of Squamous cell carcinoma of right lung (06/29/14).     PAST SURGICAL HISTORY:  Past Surgical History  Procedure Laterality Date  . Femur closed reduction Right      FAMILY HISTORY: family history includes Heart attack in her father and mother.  her mother is alive at 72 with a history of cardiac disease and diabetes mellitus.  Her father died in his 16s, unknown cause.   SOCIAL HISTORY:  reports that she quit smoking 9 days ago. Her smoking use included Cigarettes. She has a 30 pack-year smoking history. She has never used smokeless tobacco. She reports that she does not drink alcohol or use illicit drugs.  Divorced, 3 children ages 9, 86, and 49.  She is unemployed.  She had been helping her mother who is an Field seismologist.   ALLERGIES: Review of patient's allergies indicates no known allergies.   MEDICATIONS:  Current Outpatient Prescriptions  Medication Sig Dispense Refill  . aspirin 325 MG EC tablet Take 325 mg by mouth every 6 (six) hours as needed for pain.    Marland Kitchen dexamethasone (DECADRON) 4 MG tablet Take 1 tablet (4 mg total) by mouth 2 (two) times daily. 30 tablet 0  . docusate sodium (COLACE) 100 MG capsule Take 1 capsule (100 mg total) by mouth 2 (two) times daily. 10  capsule 0  . ibuprofen (ADVIL,MOTRIN) 600 MG tablet Take 1 tablet (600 mg total) by mouth 4 (four) times daily. Take for 4 days then use as needed. 30 tablet 0  . iron polysaccharides (NIFEREX) 150 MG capsule Take 1 capsule (150 mg total) by mouth daily. 30 capsule 0  . morphine (MSIR) 15 MG tablet Take 1 tablet (15 mg total) by mouth every 4 (four) hours as needed for severe pain. 30 tablet 0  . pantoprazole (PROTONIX) 40 MG tablet Take 1 tablet (40 mg total) by mouth daily at 6 (six) AM. 30 tablet 0  . vitamin C (ASCORBIC ACID) 500 MG  tablet Take 500 mg by mouth daily.    Marland Kitchen dextromethorphan (DELSYM) 30 MG/5ML liquid Take 30 mg by mouth as needed for cough.    Marland Kitchen guaiFENesin (ROBITUSSIN) 100 MG/5ML liquid Take 200 mg by mouth 3 (three) times daily as needed for cough.     No current facility-administered medications for this encounter.     REVIEW OF SYSTEMS:  Pertinent items are noted in HPI.    PHYSICAL EXAM:  height is 5' (1.524 m) and weight is 123 lb 14.4 oz (56.201 kg). Her temperature is 97.6 F (36.4 C). Her blood pressure is 117/73 and her pulse is 58. Her respiration is 16 and oxygen saturation is 100%.   Head and neck examination: Grossly unremarkable.  Nodes: There is no palpable cervical, or supraclavicular lymphadenopathy.  Chest: Diminished breath sounds along the right upper lung zone.  Heart: Regular rate and rhythm.  Abdomen without hepatomegaly.  Extremities: Without edema.  Neurologic examination: Grossly nonfocal.   LABORATORY DATA:  Lab Results  Component Value Date   WBC 8.9 07/03/2014   HGB 8.3* 07/03/2014   HCT 26.9* 07/03/2014   MCV 82.3 07/03/2014   PLT 380 07/03/2014   Lab Results  Component Value Date   NA 140 07/03/2014   K 3.8 07/03/2014   CL 110 07/03/2014   CO2 25 07/03/2014   Lab Results  Component Value Date   ALT 11 06/29/2014   AST 16 06/29/2014   ALKPHOS 45 06/29/2014   BILITOT 0.4 06/29/2014      IMPRESSION: Non-small cell carcinoma the right lung (poorly differentiated squamous cell carcinoma) with obstructive atelectasis of the right upper lobe.  We will await the results of her PET scan so we can better localize our radiation therapy fields.  It may be difficult to separate pneumonitis from neoplasm.  She will undergo CT simulation today and undergo treatment planning tomorrow and Monday, then begin her radiation therapy as early as Tuesday, March 8.  We discussed having 6 weeks of radiation therapy along with chemotherapy under the direction of Dr. Inda Merlin.  We  discussed the potential acute and late toxicities of radiation therapy.  We can better determine her prognosis following her PET scan and brain scan.   PLAN: As discussed above.  I spent 55 minutes face to face with the patient and more than 50% of that time was spent in counseling and/or coordination of care.

## 2014-07-06 ENCOUNTER — Telehealth: Payer: Self-pay | Admitting: Internal Medicine

## 2014-07-06 ENCOUNTER — Encounter: Payer: Self-pay | Admitting: Internal Medicine

## 2014-07-06 ENCOUNTER — Encounter: Payer: Self-pay | Admitting: *Deleted

## 2014-07-06 ENCOUNTER — Telehealth: Payer: Self-pay | Admitting: *Deleted

## 2014-07-06 ENCOUNTER — Ambulatory Visit (HOSPITAL_BASED_OUTPATIENT_CLINIC_OR_DEPARTMENT_OTHER): Payer: Medicaid Other | Admitting: Internal Medicine

## 2014-07-06 ENCOUNTER — Other Ambulatory Visit (HOSPITAL_BASED_OUTPATIENT_CLINIC_OR_DEPARTMENT_OTHER): Payer: Medicaid Other

## 2014-07-06 VITALS — BP 123/76 | HR 57 | Temp 98.1°F | Resp 18 | Ht 60.0 in | Wt 124.5 lb

## 2014-07-06 DIAGNOSIS — C3411 Malignant neoplasm of upper lobe, right bronchus or lung: Secondary | ICD-10-CM

## 2014-07-06 DIAGNOSIS — C3491 Malignant neoplasm of unspecified part of right bronchus or lung: Secondary | ICD-10-CM

## 2014-07-06 LAB — COMPREHENSIVE METABOLIC PANEL (CC13)
ALK PHOS: 67 U/L (ref 40–150)
ALT: 10 U/L (ref 0–55)
AST: 9 U/L (ref 5–34)
Albumin: 2.8 g/dL — ABNORMAL LOW (ref 3.5–5.0)
Anion Gap: 10 mEq/L (ref 3–11)
BILIRUBIN TOTAL: 0.22 mg/dL (ref 0.20–1.20)
BUN: 15.2 mg/dL (ref 7.0–26.0)
CALCIUM: 9.2 mg/dL (ref 8.4–10.4)
CO2: 25 meq/L (ref 22–29)
CREATININE: 0.7 mg/dL (ref 0.6–1.1)
Chloride: 107 mEq/L (ref 98–109)
EGFR: >90 mL/min/{1.73_m2} (ref >90)
Glucose: 107 mg/dl (ref 70–140)
Potassium: 4.4 mEq/L (ref 3.5–5.1)
Sodium: 142 mEq/L (ref 136–145)
TOTAL PROTEIN: 7.4 g/dL (ref 6.4–8.3)

## 2014-07-06 LAB — CBC WITH DIFFERENTIAL/PLATELET
BASO%: 0.1 % (ref 0.0–2.0)
Basophils Absolute: 0 10*3/uL (ref 0.0–0.1)
EOS%: 0 % (ref 0.0–7.0)
Eosinophils Absolute: 0 10*3/uL (ref 0.0–0.5)
HEMATOCRIT: 30.7 % — AB (ref 34.8–46.6)
HEMOGLOBIN: 9.5 g/dL — AB (ref 11.6–15.9)
LYMPH#: 1.5 10*3/uL (ref 0.9–3.3)
LYMPH%: 3.9 % — ABNORMAL LOW (ref 14.0–49.7)
MCH: 25 pg — AB (ref 25.1–34.0)
MCHC: 31.1 g/dL — ABNORMAL LOW (ref 31.5–36.0)
MCV: 80.5 fL (ref 79.5–101.0)
MONO#: 1.3 10*3/uL — ABNORMAL HIGH (ref 0.1–0.9)
MONO%: 3.3 % (ref 0.0–14.0)
NEUT#: 35.5 10*3/uL — ABNORMAL HIGH (ref 1.5–6.5)
NEUT%: 92.7 % — ABNORMAL HIGH (ref 38.4–76.8)
PLATELETS: 561 10*3/uL — AB (ref 145–400)
RBC: 3.82 10*6/uL (ref 3.70–5.45)
RDW: 16 % — AB (ref 11.2–14.5)
WBC: 38.3 10*3/uL — ABNORMAL HIGH (ref 3.9–10.3)

## 2014-07-06 MED ORDER — PROCHLORPERAZINE MALEATE 10 MG PO TABS: 10.0000 mg | ORAL_TABLET | Freq: Four times a day (QID) | ORAL | Status: DC | PRN

## 2014-07-06 MED ORDER — OXYCODONE-ACETAMINOPHEN 5-325 MG PO TABS: 1.0000 | ORAL_TABLET | Freq: Four times a day (QID) | ORAL | Status: DC | PRN

## 2014-07-06 NOTE — Progress Notes (Signed)
Complex simulation/treatment planning note: The patient was taken to the CT simulator on 07/05/2014.  She was set up on a wing board.  Her chest was scanned.  The CT data set was sent to the planning system for contouring of her normal anatomy.  I contoured what I thought would be a reasonable GTV, which is obviously difficult because of her right upper lobe collapse.  This will be expanded by 0.5 cm to create CTV, and then a further 0.5 cm to create PTV.  I'm initially prescribing 800 cGy in 4 sessions until she has her PET scan next week.  She is now ready for 3-D simulation.

## 2014-07-06 NOTE — Telephone Encounter (Signed)
Per staff message and POF I have scheduled appts. Advised scheduler of appts. JMW  

## 2014-07-06 NOTE — [facility] Navigator Note (Unsigned)
Spoke with patient for the first time today at [facility].  She has not started chemo and is having staging workup.  She had complaints of pain today and stated the morphine was not working for her.  Dr. Julien Nordmann is aware and prescribed a different pain medication.  She was thankful for the help.

## 2014-07-06 NOTE — Telephone Encounter (Signed)
Gave avs & calendar for March/April. Sent message to schedule treatment.

## 2014-07-06 NOTE — Addendum Note (Signed)
Encounter addended by: Deirdre Evener, RN on: 07/06/2014  2:09 PM<BR>     Documentation filed: Charges VN

## 2014-07-07 NOTE — Progress Notes (Signed)
[place] Telephone:([phone]) [phone]   Fax:(336) [place], [place] [address]  DIAGNOSIS: Locally advanced questionable for a stage IIIA (T3, N2, M0) non-small cell lung cancer, squamous cell carcinoma diagnosed in February 2016 presented with large right upper lobe lung mass with mediastinal invasion.  PRIOR THERAPY: None  CURRENT THERAPY: Concurrent chemoradiation with weekly carboplatin for AUC of 2 and paclitaxel 45 MG/M2. First dose 07/16/2014.  INTERVAL HISTORY: Barbara Frost 44 y.o. female returns to the clinic today for hospital follow-up visit accompanied by her mother and boyfriend. The patient was recently diagnosed with locally advanced non-small cell lung cancer, squamous cell carcinoma with a large mass in the right upper lobe and mediastinal invasion. I'll order staging scans including PET scan and MRI of the brain and the site is scheduled to be performed next week. The patient was also seen by Dr. Valere Dross and she is expected to start radiotherapy next week. She is feeling fine today was no specific complaints except for shortness breath with exertion and right chest wall pain. She is currently on morphine sulfate for pain management but the patient doesn't like it because it causes headache and hallucination. She would like to start a different pain medication. She denied having any significant nausea or vomiting, no fever or chills. She is a today for evaluation and discussion of her treatment options.  MEDICAL HISTORY: Past Medical History  Diagnosis Date  . Squamous cell carcinoma of right lung 06/29/14    ALLERGIES:  has No Known Allergies.  MEDICATIONS:  Current Outpatient Prescriptions  Medication Sig Dispense Refill  . aspirin 325 MG EC tablet Take 325 mg by mouth every 6 (six) hours as needed for pain.    Marland Kitchen dexamethasone (DECADRON) 4 MG tablet Take 1 tablet (4 mg total) by mouth 2  (two) times daily. 30 tablet 0  . docusate sodium (COLACE) 100 MG capsule Take 1 capsule (100 mg total) by mouth 2 (two) times daily. 10 capsule 0  . ibuprofen (ADVIL,MOTRIN) 600 MG tablet Take 1 tablet (600 mg total) by mouth 4 (four) times daily. Take for 4 days then use as needed. 30 tablet 0  . iron polysaccharides (NIFEREX) 150 MG capsule Take 1 capsule (150 mg total) by mouth daily. 30 capsule 0  . pantoprazole (PROTONIX) 40 MG tablet Take 1 tablet (40 mg total) by mouth daily at 6 (six) AM. 30 tablet 0  . vitamin C (ASCORBIC ACID) 500 MG tablet Take 500 mg by mouth daily.    Marland Kitchen dextromethorphan (DELSYM) 30 MG/5ML liquid Take 30 mg by mouth as needed for cough.    Marland Kitchen guaiFENesin (ROBITUSSIN) 100 MG/5ML liquid Take 200 mg by mouth 3 (three) times daily as needed for cough.    Marland Kitchen oxyCODONE-acetaminophen (PERCOCET/ROXICET) 5-325 MG per tablet Take 1 tablet by mouth every 6 (six) hours as needed for severe pain. 60 tablet 0  . prochlorperazine (COMPAZINE) 10 MG tablet Take 1 tablet (10 mg total) by mouth every 6 (six) hours as needed for nausea or vomiting. 30 tablet 0   No current facility-administered medications for this visit.    SURGICAL HISTORY:  Past Surgical History  Procedure Laterality Date  . Femur closed reduction Right     REVIEW OF SYSTEMS:  Constitutional: positive for fatigue and weight loss Eyes: negative Ears, nose, mouth, throat, and face: negative Respiratory: positive for cough, dyspnea on exertion, hemoptysis and pleurisy/chest pain  Cardiovascular: negative Gastrointestinal: negative Genitourinary:negative Integument/breast: negative Hematologic/lymphatic: negative Musculoskeletal:negative Neurological: negative Behavioral/Psych: negative Endocrine: negative Allergic/Immunologic: negative   PHYSICAL EXAMINATION: General appearance: alert, cooperative, fatigued and no distress Head: Normocephalic, without obvious abnormality, atraumatic Neck: no adenopathy, no  JVD, supple, symmetrical, trachea midline and thyroid not enlarged, symmetric, no tenderness/mass/nodules Lymph nodes: Cervical, supraclavicular, and axillary nodes normal. Resp: diminished breath sounds RUL and dullness to percussion RUL Back: symmetric, no curvature. ROM normal. No CVA tenderness. Cardio: regular rate and rhythm, S1, S2 normal, no murmur, click, rub or gallop GI: soft, non-tender; bowel sounds normal; no masses,  no organomegaly Extremities: extremities normal, atraumatic, no cyanosis or edema Neurologic: Alert and oriented X 3, normal strength and tone. Normal symmetric reflexes. Normal coordination and gait  ECOG PERFORMANCE STATUS: 1 - Symptomatic but completely ambulatory  Blood pressure 123/76, pulse 57, temperature 98.1 F (36.7 C), temperature source Oral, resp. rate 18, height 5' (1.524 m), weight 124 lb 8 oz (56.473 kg), last menstrual period 06/06/2014, SpO2 100 %.  LABORATORY DATA: Lab Results  Component Value Date   WBC 38.3* 07/06/2014   HGB 9.5* 07/06/2014   HCT 30.7* 07/06/2014   MCV 80.5 07/06/2014   PLT 561* 07/06/2014      Chemistry      Component Value Date/Time   NA 142 07/06/2014 0928   NA 140 07/03/2014 0540   K 4.4 07/06/2014 0928   K 3.8 07/03/2014 0540   CL 110 07/03/2014 0540   CO2 25 07/06/2014 0928   CO2 25 07/03/2014 0540   BUN 15.2 07/06/2014 0928   BUN 12 07/03/2014 0540   CREATININE 0.7 07/06/2014 0928   CREATININE 0.52 07/03/2014 0540      Component Value Date/Time   CALCIUM 9.2 07/06/2014 0928   CALCIUM 8.2* 07/03/2014 0540   ALKPHOS 67 07/06/2014 0928   ALKPHOS 45 06/29/2014 0600   AST 9 07/06/2014 0928   AST 16 06/29/2014 0600   ALT 10 07/06/2014 0928   ALT 11 06/29/2014 0600   BILITOT 0.22 07/06/2014 0928   BILITOT 0.4 06/29/2014 0600       RADIOGRAPHIC STUDIES: Dg Chest 1 View  06/29/2014   CLINICAL DATA:  Evaluate for pneumothorax after biopsy.  EXAM: CHEST  1 VIEW  COMPARISON:  06/28/2014 CT and plain  film.  FINDINGS: Large right upper lobe mass/ opacity again noted, unchanged. No pneumothorax. No effusion. Left lung is clear. Heart is normal size.  IMPRESSION: No pneumothorax following right upper lobe mass biopsy.   Electronically Signed   By: Rolm Baptise M.D.   On: 06/29/2014 19:19   Dg Chest 2 View  06/28/2014   CLINICAL DATA:  Cough for 3 weeks. Chest pain. Shortness of breath.  EXAM: CHEST  2 VIEW  COMPARISON:  None.  FINDINGS: Complete opacification of the right upper lobe with questionable partial collapse. Right lower lung zone is clear. The left lung is clear. The heart size is normal. There is no pleural effusion. No pneumothorax. No acute osseous abnormalities are seen.  IMPRESSION: Complete opacification of the right upper lobe. This may reflect pneumonia with partial collapse. Chest CT with contrast recommended for further evaluation to evaluate for/exclude central obstructing lesion.   Electronically Signed   By: Jeb Levering M.D.   On: 06/28/2014 05:57   Ct Angio Chest Pe W/cm &/or Wo Cm  06/28/2014   CLINICAL DATA:  Shortness of breath and wheezing; hemoptysis  EXAM: CT ANGIOGRAPHY CHEST WITH CONTRAST  TECHNIQUE: Multidetector CT imaging  of the chest was performed using the standard protocol during bolus administration of intravenous contrast. Multiplanar CT image reconstructions and MIPs were obtained to evaluate the vascular anatomy.  CONTRAST:  158mL OMNIPAQUE IOHEXOL 350 MG/ML SOLN  COMPARISON:  Chest radiograph June 28, 2014  FINDINGS: There is no demonstrable pulmonary embolus. There is no thoracic aortic aneurysm or dissection.  There is obstruction of the right upper lobe bronchus. There is consolidation throughout most of the right upper lobe.  There is underlying emphysematous change with scattered small bullae throughout the aerated lungs. On axial slice 63 series 7, there is a 4 mm nodular opacity in the anterior segment of the right lower lobe. There is no appreciable  thoracic adenopathy. The pericardium is not thickened.  In the visualized upper abdomen, there is hepatic steatosis.  There are no blastic or lytic bone lesions. There is a 5 mm nodular opacity in the anterior aspect of the right lobe of the thyroid.  Review of the MIP images confirms the above findings.  IMPRESSION: No demonstrable pulmonary embolus.  Obstruction of the right upper lobe bronchus with extensive consolidation. Suspect mass with postobstructive pneumonitis. It is not possible to separate mass from consolidation in the right upper lobe. This finding may well warrant bronchoscopy to further assess.  4 mm nodular opacity anterior segment right lower lobe. This finding could represent a small metastasis.  Underlying emphysematous change.  No adenopathy.  Hepatic steatosis.   Electronically Signed   By: Lowella Grip III M.D.   On: 06/28/2014 07:57   Ct Biopsy  06/29/2014   CLINICAL DATA:  44 year old female with right upper lung mass. She has been referred for percutaneous biopsy.  EXAM: CT GUIDED BIOPSY BIOPSY OF RIGHT LUNG MASS  ANESTHESIA/SEDATION: 3.0  Mg IV Versed; 50 mcg IV Fentanyl  Total Moderate Sedation Time: 14 minutes.  PROCEDURE: The procedure risks, benefits, and alternatives were explained to the patient. Questions regarding the procedure were encouraged and answered. The patient understands and consents to the procedure.  CT of the chest performed for planning purposes.  The right anterior chest was prepped with Betadinein a sterile fashion, and a sterile drape was applied covering the operative field. A sterile gown and sterile gloves were used for the procedure. Local anesthesia was provided with 1% Lidocaine.  Once the patient is prepped and draped sterilely in the skin and subcutaneous tissues were infiltrated with 1% lidocaine for local anesthesia. A 17 gauge guide needle was then advanced through the chest wall into the mass of the right upper lobe. Four separate 18 gauge  biopsy were retrieved. These are placed in formalin.  The needle was removed and a final CT study was performed.  No complicating feature for identified.  The patient tolerated the procedure well and remained hemodynamically stable throughout.  No complications were encountered and no significant blood loss was encountered.  COMPLICATIONS: None  FINDINGS: Right upper lobe mass, similar to the prior CT.  There are some ill-defined density central as within the mass. These were targeted with the needle, as the mass is difficult to differentiate from overlying atelectatic lung.  Status post biopsy, no complicating features were identified.  IMPRESSION: Status post CT-guided biopsy of right upper lobe lung mass. Tissue specimen sent to pathology for complete histopathologic analysis.  Signed,  Dulcy Fanny. Earleen Newport, DO  [facility]  [facility]   Electronically Signed   By: Corrie Mckusick D.O.   On: 06/29/2014 18:48  ASSESSMENT AND PLAN: This is a very pleasant 44 years old white female recently diagnosed with at least a stage IIIa (T3, N2, M0) non-small cell lung cancer, squamous cell carcinoma presented with large right upper lobe mass with questionable mediastinal invasion diagnosed in February 2016. I had a lengthy discussion with the patient and her family today about her current disease stage, prognosis and treatment options. I am still waiting for the final staging workup including a PET scan and MRI of the brain. I discussed with the patient her treatment options and I recommended for her a regimen consisting of concurrent chemoradiation with weekly carboplatin for AUC of 2 and paclitaxel 45 MG/M2 if she has no evidence for disease metastasis. I discussed with the patient adverse effect of the chemotherapy including but not limited to alopecia, myelosuppression, nausea and vomiting, peripheral neuropathy, liver or renal dysfunction. The patient is expected to  start radiotherapy next week under the care of Dr. Valere Dross. I will arrange for her to receive the first cycle of her chemotherapy on 07/16/2014. I will arrange for the patient a chemotherapy education class before receiving the first dose of her chemotherapy. I will call her pharmacy with prescription for Compazine 10 mg by mouth every 6 hours as needed for nausea. For pain management the patient has intolerance to morphine sulfate. I recommended for her to discontinue this treatment and I will start her on Percocet 5/325 mg by mouth every 6 hours as needed for pain. The patient would come back for follow-up visit in 2 weeks for reevaluation and management of any adverse effect of her treatment. She was advised to call immediately if she has any concerning symptoms in the interval. The patient voices understanding of current disease status and treatment options and is in agreement with the current care plan.  All questions were answered. The patient knows to call the clinic with any problems, questions or concerns. We can certainly see the patient much sooner if necessary.  I spent 20 minutes counseling the patient face to face. The total time spent in the appointment was 30 minutes.  Disclaimer: This note was dictated with voice recognition software. Similar sounding words can inadvertently be transcribed and may not be corrected upon review.

## 2014-07-09 ENCOUNTER — Other Ambulatory Visit: Payer: Self-pay

## 2014-07-09 ENCOUNTER — Encounter: Payer: Self-pay | Admitting: Radiation Oncology

## 2014-07-09 DIAGNOSIS — C3491 Malignant neoplasm of unspecified part of right bronchus or lung: Secondary | ICD-10-CM | POA: Diagnosis not present

## 2014-07-09 NOTE — Progress Notes (Signed)
3-D simulation note: The patient underwent 3-D simulation for treatment to her right lung.  Her GTV was expanded by 0.5 cm to create CTV 8 and this was expanded by 0.5 cm to create PTV 8.  This target will receive 800 cGy in 4 sessions until she has had a PET scan for further conformal planning and treatment.  She is setup to a 5 field technique.  She is being treated with mixed 6 MV/10 MV photons.  Dose volume histograms were obtained for the target structures and also avoidance structures including the lungs, spinal cord, and heart.  We are meeting our departmental guidelines.  She will also receive concomitant chemotherapy in this constitutes a special treatment procedure because of the increased risk for radiation toxicities including esophagitis and decrease in her blood counts.

## 2014-07-10 ENCOUNTER — Ambulatory Visit
Admission: RE | Admit: 2014-07-10 | Discharge: 2014-07-10 | Disposition: A | Discharge status: Home / Self Care | Payer: Medicaid Other | Source: Ambulatory Visit | Attending: Radiation Oncology | Admitting: Radiation Oncology

## 2014-07-10 ENCOUNTER — Telehealth: Payer: Self-pay | Admitting: Internal Medicine

## 2014-07-10 DIAGNOSIS — C3491 Malignant neoplasm of unspecified part of right bronchus or lung: Secondary | ICD-10-CM | POA: Diagnosis not present

## 2014-07-10 NOTE — Telephone Encounter (Signed)
Printed and faxed medical records to Dr. Leontine Locket office on 07/09/14.  TG

## 2014-07-11 ENCOUNTER — Encounter (HOSPITAL_COMMUNITY)
Admission: RE | Admit: 2014-07-11 | Discharge: 2014-07-11 | Disposition: A | Discharge status: Home / Self Care | Payer: Medicaid Other | Source: Ambulatory Visit | Attending: Internal Medicine | Admitting: Internal Medicine

## 2014-07-11 ENCOUNTER — Ambulatory Visit (HOSPITAL_COMMUNITY)
Admission: RE | Admit: 2014-07-11 | Discharge: 2014-07-11 | Disposition: A | Discharge status: Home / Self Care | Payer: Medicaid Other | Source: Ambulatory Visit | Attending: Internal Medicine | Admitting: Internal Medicine

## 2014-07-11 ENCOUNTER — Encounter: Payer: Self-pay | Admitting: Radiation Oncology

## 2014-07-11 ENCOUNTER — Ambulatory Visit
Admission: RE | Admit: 2014-07-11 | Discharge: 2014-07-11 | Disposition: A | Discharge status: Home / Self Care | Payer: Medicaid Other | Source: Ambulatory Visit | Attending: Radiation Oncology | Admitting: Radiation Oncology

## 2014-07-11 DIAGNOSIS — C3491 Malignant neoplasm of unspecified part of right bronchus or lung: Secondary | ICD-10-CM | POA: Insufficient documentation

## 2014-07-11 LAB — GLUCOSE, CAPILLARY: Glucose-Capillary: 115 mg/dL — ABNORMAL HIGH (ref 70–99)

## 2014-07-11 MED ORDER — GADOBENATE DIMEGLUMINE 529 MG/ML IV SOLN
15.0000 mL | Freq: Once | INTRAVENOUS | Status: AC | PRN
  Administered 2014-07-11: 12 mL via INTRAVENOUS

## 2014-07-11 MED ORDER — FLUDEOXYGLUCOSE F - 18 (FDG) INJECTION
6.0900 | Freq: Once | INTRAVENOUS | Status: AC | PRN
  Administered 2014-07-11: 6.09 via INTRAVENOUS

## 2014-07-11 NOTE — Progress Notes (Signed)
Complex simulation note: The patient had a PET scan earlier today.  I went back to her original CT data set and contoured her high-risk CTV.  This will be expanded by 0.5 cm to create PTV 52 which will receive 5200 cGy in 26 sessions.  Dose volume histograms are requested for the target and avoidance structures.

## 2014-07-11 NOTE — Addendum Note (Signed)
Encounter addended by: Benn Moulder, RN on: 07/11/2014  1:46 PM<BR>     Documentation filed: Arn Medal VN

## 2014-07-12 ENCOUNTER — Ambulatory Visit
Admission: RE | Admit: 2014-07-12 | Discharge: 2014-07-12 | Disposition: A | Discharge status: Home / Self Care | Payer: Medicaid Other | Source: Ambulatory Visit | Attending: Radiation Oncology | Admitting: Radiation Oncology

## 2014-07-12 ENCOUNTER — Encounter: Payer: Self-pay | Admitting: Radiation Oncology

## 2014-07-12 DIAGNOSIS — C3491 Malignant neoplasm of unspecified part of right bronchus or lung: Secondary | ICD-10-CM | POA: Diagnosis not present

## 2014-07-12 NOTE — Progress Notes (Signed)
Complex simulation note: The patient was recently simulated after having her PET scan which showed disease essentially involving her entire right upper lobe infiltrate.  She was again set up to a 5 field technique with 5 unique MLCs along with wedges to conform the field.  Dose volume histograms were obtained for the target and avoidance structures.  We met our departmental guidelines.  She will receive a further 5200 cGy in 26 sessions utilizing mixed 6 MV, 10 MV, and 15 MV photons.

## 2014-07-13 ENCOUNTER — Ambulatory Visit
Admission: RE | Admit: 2014-07-13 | Discharge: 2014-07-13 | Disposition: A | Discharge status: Home / Self Care | Payer: Medicaid Other | Source: Ambulatory Visit | Attending: Radiation Oncology | Admitting: Radiation Oncology

## 2014-07-13 DIAGNOSIS — C3491 Malignant neoplasm of unspecified part of right bronchus or lung: Secondary | ICD-10-CM | POA: Diagnosis not present

## 2014-07-16 ENCOUNTER — Encounter: Payer: Self-pay | Admitting: Radiation Oncology

## 2014-07-16 ENCOUNTER — Ambulatory Visit
Admission: RE | Admit: 2014-07-16 | Discharge: 2014-07-16 | Disposition: A | Discharge status: Home / Self Care | Payer: Self-pay | Source: Ambulatory Visit | Attending: Radiation Oncology | Admitting: Radiation Oncology

## 2014-07-16 ENCOUNTER — Other Ambulatory Visit (HOSPITAL_BASED_OUTPATIENT_CLINIC_OR_DEPARTMENT_OTHER): Payer: Medicaid Other

## 2014-07-16 ENCOUNTER — Ambulatory Visit
Admission: RE | Admit: 2014-07-16 | Discharge: 2014-07-16 | Disposition: A | Discharge status: Home / Self Care | Payer: Medicaid Other | Source: Ambulatory Visit | Attending: Radiation Oncology | Admitting: Radiation Oncology

## 2014-07-16 ENCOUNTER — Telehealth: Payer: Self-pay | Admitting: Medical Oncology

## 2014-07-16 ENCOUNTER — Encounter: Payer: Self-pay | Admitting: *Deleted

## 2014-07-16 ENCOUNTER — Ambulatory Visit (HOSPITAL_BASED_OUTPATIENT_CLINIC_OR_DEPARTMENT_OTHER): Payer: Medicaid Other | Admitting: Nurse Practitioner

## 2014-07-16 ENCOUNTER — Ambulatory Visit (HOSPITAL_BASED_OUTPATIENT_CLINIC_OR_DEPARTMENT_OTHER): Payer: Medicaid Other

## 2014-07-16 ENCOUNTER — Other Ambulatory Visit: Payer: Self-pay | Admitting: Medical Oncology

## 2014-07-16 VITALS — BP 119/80 | HR 56 | Temp 97.2°F | Resp 18

## 2014-07-16 VITALS — BP 135/89 | HR 56 | Temp 98.1°F | Resp 16 | Wt 129.8 lb

## 2014-07-16 DIAGNOSIS — C3411 Malignant neoplasm of upper lobe, right bronchus or lung: Secondary | ICD-10-CM

## 2014-07-16 DIAGNOSIS — C3490 Malignant neoplasm of unspecified part of unspecified bronchus or lung: Secondary | ICD-10-CM

## 2014-07-16 DIAGNOSIS — Z5111 Encounter for antineoplastic chemotherapy: Secondary | ICD-10-CM | POA: Diagnosis not present

## 2014-07-16 DIAGNOSIS — C3491 Malignant neoplasm of unspecified part of right bronchus or lung: Secondary | ICD-10-CM | POA: Diagnosis not present

## 2014-07-16 DIAGNOSIS — K088 Other specified disorders of teeth and supporting structures: Secondary | ICD-10-CM | POA: Diagnosis present

## 2014-07-16 DIAGNOSIS — K0889 Other specified disorders of teeth and supporting structures: Secondary | ICD-10-CM

## 2014-07-16 LAB — CBC WITH DIFFERENTIAL/PLATELET
BASO%: 0.7 % (ref 0.0–2.0)
BASOS ABS: 0.3 10*3/uL — AB (ref 0.0–0.1)
EOS%: 0 % (ref 0.0–7.0)
Eosinophils Absolute: 0 10*3/uL (ref 0.0–0.5)
HCT: 36.8 % (ref 34.8–46.6)
HEMOGLOBIN: 11.4 g/dL — AB (ref 11.6–15.9)
LYMPH#: 0.9 10*3/uL (ref 0.9–3.3)
LYMPH%: 2.4 % — ABNORMAL LOW (ref 14.0–49.7)
MCH: 26.3 pg (ref 25.1–34.0)
MCHC: 30.8 g/dL — ABNORMAL LOW (ref 31.5–36.0)
MCV: 85.4 fL (ref 79.5–101.0)
MONO#: 3.1 10*3/uL — ABNORMAL HIGH (ref 0.1–0.9)
MONO%: 8.7 % (ref 0.0–14.0)
NEUT#: 31.5 10*3/uL — ABNORMAL HIGH (ref 1.5–6.5)
NEUT%: 88.2 % — ABNORMAL HIGH (ref 38.4–76.8)
Platelets: 416 10*3/uL — ABNORMAL HIGH (ref 145–400)
RBC: 4.31 10*6/uL (ref 3.70–5.45)
RDW: 24.3 % — AB (ref 11.2–14.5)
WBC: 35.8 10*3/uL — ABNORMAL HIGH (ref 3.9–10.3)

## 2014-07-16 LAB — COMPREHENSIVE METABOLIC PANEL (CC13)
ALBUMIN: 2.7 g/dL — AB (ref 3.5–5.0)
ALT: 46 U/L (ref 0–55)
ANION GAP: 10 meq/L (ref 3–11)
AST: 15 U/L (ref 5–34)
Alkaline Phosphatase: 81 U/L (ref 40–150)
BUN: 20.8 mg/dL (ref 7.0–26.0)
CHLORIDE: 106 meq/L (ref 98–109)
CO2: 22 meq/L (ref 22–29)
Calcium: 8.4 mg/dL (ref 8.4–10.4)
Creatinine: 0.7 mg/dL (ref 0.6–1.1)
EGFR: >90 mL/min/{1.73_m2} (ref >90)
Glucose: 119 mg/dl (ref 70–140)
POTASSIUM: 4.1 meq/L (ref 3.5–5.1)
Sodium: 139 mEq/L (ref 136–145)
TOTAL PROTEIN: 5.9 g/dL — AB (ref 6.4–8.3)
Total Bilirubin: 0.32 mg/dL (ref 0.20–1.20)

## 2014-07-16 LAB — TECHNOLOGIST REVIEW

## 2014-07-16 MED ORDER — FAMOTIDINE IN NACL 20-0.9 MG/50ML-% IV SOLN
20.0000 mg | Freq: Once | INTRAVENOUS | Status: AC
  Administered 2014-07-16: 20 mg via INTRAVENOUS

## 2014-07-16 MED ORDER — DIPHENHYDRAMINE HCL 50 MG/ML IJ SOLN
INTRAMUSCULAR | Status: AC
  Filled 2014-07-16: qty 1

## 2014-07-16 MED ORDER — SODIUM CHLORIDE 0.9 % IV SOLN
Freq: Once | INTRAVENOUS | Status: AC
  Administered 2014-07-16: 11:00:00 via INTRAVENOUS

## 2014-07-16 MED ORDER — PACLITAXEL CHEMO INJECTION 300 MG/50ML
45.0000 mg/m2 | Freq: Once | INTRAVENOUS | Status: AC
  Administered 2014-07-16: 72 mg via INTRAVENOUS
  Filled 2014-07-16: qty 12

## 2014-07-16 MED ORDER — DEXAMETHASONE SODIUM PHOSPHATE 100 MG/10ML IJ SOLN
Freq: Once | INTRAMUSCULAR | Status: AC
  Administered 2014-07-16: 11:00:00 via INTRAVENOUS
  Filled 2014-07-16: qty 8

## 2014-07-16 MED ORDER — OXYCODONE-ACETAMINOPHEN 5-325 MG PO TABS
ORAL_TABLET | ORAL | Status: AC
  Filled 2014-07-16: qty 1

## 2014-07-16 MED ORDER — FAMOTIDINE IN NACL 20-0.9 MG/50ML-% IV SOLN
INTRAVENOUS | Status: AC
  Filled 2014-07-16: qty 50

## 2014-07-16 MED ORDER — SODIUM CHLORIDE 0.9 % IV SOLN
210.0000 mg | Freq: Once | INTRAVENOUS | Status: AC
  Administered 2014-07-16: 210 mg via INTRAVENOUS
  Filled 2014-07-16: qty 21

## 2014-07-16 MED ORDER — OXYCODONE-ACETAMINOPHEN 5-325 MG PO TABS
1.0000 | ORAL_TABLET | Freq: Once | ORAL | Status: AC
  Administered 2014-07-16: 1 via ORAL

## 2014-07-16 MED ORDER — CEPHALEXIN 500 MG PO CAPS: 500.0000 mg | ORAL_CAPSULE | Freq: Four times a day (QID) | ORAL | Status: DC

## 2014-07-16 MED ORDER — DIPHENHYDRAMINE HCL 50 MG/ML IJ SOLN
50.0000 mg | Freq: Once | INTRAMUSCULAR | Status: AC
  Administered 2014-07-16: 50 mg via INTRAVENOUS

## 2014-07-16 NOTE — [facility] Oncology Navigator Note (Unsigned)
Completed gas card application with patient from the lung cancer inisiative gas card program.  Gave to Lauren to scan and email to document to program.

## 2014-07-16 NOTE — Progress Notes (Signed)
Patient states she has a toothache and feels that it is developing into an absess. She states she has a cavity there. She not been to the dentist in quite awhile.  She states it is painful. shhe has been taking percocet and ibuprofen for it.  Notified Dr. Worthy Flank nurse and she will discuss with Dr. Julien Nordmann.

## 2014-07-16 NOTE — Progress Notes (Signed)
   Weekly Management Note:  Outpatient    ICD-9-CM ICD-10-CM   1. Non-small cell carcinoma of lung, stage 3, right 162.9 C34.91 Ambulatory referral to Social Work    Current Dose:  10 Gy  Projected Dose: 60 Gy   Narrative:  The patient presents for routine under treatment assessment.  CBCT/MVCT images/Port film x-rays were reviewed.  The chart was checked.Denies acute effects r/t RT; running late for chemotherapy   BP 135/89 mmHg  Pulse 56  Temp(Src) 98.1 F (36.7 C) (Oral)  Resp 16  Wt 129 lb 12.8 oz (58.877 kg)  SpO2 99%  LMP 07/11/2014   Physical Findings:  Wt Readings from Last 3 Encounters:  07/16/14 129 lb 12.8 oz (58.877 kg)  07/06/14 124 lb 8 oz (56.473 kg)  07/05/14 123 lb 14.4 oz (56.201 kg)    weight is 129 lb 12.8 oz (58.877 kg). Her oral temperature is 98.1 F (36.7 C). Her blood pressure is 135/89 and her pulse is 56. Her respiration is 16 and oxygen saturation is 99%.  Ambulatory, not SOB  CBC    Component Value Date/Time   WBC 35.8* 07/16/2014 0810   WBC 8.9 07/03/2014 0540   RBC 4.31 07/16/2014 0810   RBC 3.27* 07/03/2014 0540   RBC 3.40* 06/30/2014 0852   HGB 11.4* 07/16/2014 0810   HGB 8.3* 07/03/2014 0540   HCT 36.8 07/16/2014 0810   HCT 26.9* 07/03/2014 0540   PLT 416* 07/16/2014 0810   PLT 380 07/03/2014 0540   MCV 85.4 07/16/2014 0810   MCV 82.3 07/03/2014 0540   MCH 26.3 07/16/2014 0810   MCH 25.4* 07/03/2014 0540   MCHC 30.8* 07/16/2014 0810   MCHC 30.9 07/03/2014 0540   RDW 24.3* 07/16/2014 0810   RDW 15.4 07/03/2014 0540   LYMPHSABS 0.9 07/16/2014 0810   LYMPHSABS 1.9 06/28/2014 0543   MONOABS 3.1* 07/16/2014 0810   MONOABS 1.0 06/28/2014 0543   EOSABS 0.0 07/16/2014 0810   EOSABS 0.5 06/28/2014 0543   BASOSABS 0.3* 07/16/2014 0810   BASOSABS 0.0 06/28/2014 0543     CMP     Component Value Date/Time   NA 139 07/16/2014 0810   NA 140 07/03/2014 0540   K 4.1 07/16/2014 0810   K 3.8 07/03/2014 0540   CL 110 07/03/2014  0540   CO2 22 07/16/2014 0810   CO2 25 07/03/2014 0540   GLUCOSE 119 07/16/2014 0810   GLUCOSE 90 07/03/2014 0540   BUN 20.8 07/16/2014 0810   BUN 12 07/03/2014 0540   CREATININE 0.7 07/16/2014 0810   CREATININE 0.52 07/03/2014 0540   CALCIUM 8.4 07/16/2014 0810   CALCIUM 8.2* 07/03/2014 0540   PROT 5.9* 07/16/2014 0810   PROT 6.6 06/29/2014 0600   ALBUMIN 2.7* 07/16/2014 0810   ALBUMIN 2.7* 06/29/2014 0600   AST 15 07/16/2014 0810   AST 16 06/29/2014 0600   ALT 46 07/16/2014 0810   ALT 11 06/29/2014 0600   ALKPHOS 81 07/16/2014 0810   ALKPHOS 45 06/29/2014 0600   BILITOT 0.32 07/16/2014 0810   BILITOT 0.4 06/29/2014 0600   GFRNONAA >90 07/03/2014 0540   GFRAA >90 07/03/2014 0540     Impression:  The patient is tolerating radiotherapy.   Plan:  Continue radiotherapy as planned.   -----------------------------------  Eppie Gibson, MD

## 2014-07-16 NOTE — [facility] Oncology Navigator Note (Unsigned)
Spoke to patient today at her first chemotherapy.  She is having a tooth ache and will be evaluated by symptom management, NP.  She is nervous about starting her first cycle of chemotherapy but ready to get started.  Her mother is sitting with her.  I will follow up with financial advocates to make sure they have seen her.

## 2014-07-16 NOTE — Patient Instructions (Signed)
[place] Discharge Instructions for Patients Receiving Chemotherapy  Today you received the following chemotherapy agents:  Taxol and Carboplatin  To help prevent nausea and vomiting after your treatment, we encourage you to take your nausea medication:  Compazine 10 mg every 6 hours as needed.   If you develop nausea and vomiting that is not controlled by your nausea medication, call the clinic.   BELOW ARE SYMPTOMS THAT SHOULD BE REPORTED IMMEDIATELY:  *FEVER GREATER THAN 100.5 F  *CHILLS WITH OR WITHOUT FEVER  NAUSEA AND VOMITING THAT IS NOT CONTROLLED WITH YOUR NAUSEA MEDICATION  *UNUSUAL SHORTNESS OF BREATH  *UNUSUAL BRUISING OR BLEEDING  TENDERNESS IN MOUTH AND THROAT WITH OR WITHOUT PRESENCE OF ULCERS  *URINARY PROBLEMS  *BOWEL PROBLEMS  UNUSUAL RASH Items with * indicate a potential emergency and should be followed up as soon as possible.  Feel free to call the clinic you have any questions or concerns. The clinic phone number is [phone].

## 2014-07-16 NOTE — Telephone Encounter (Signed)
Pt reports toothache, "abcessed tooth" Per Mohamed see Cyndee bacon.

## 2014-07-16 NOTE — Progress Notes (Signed)
She rates her pain as a 8 on a scale of 0-10. constant and sharp over tooth. Pt complains of fatigue and loss of sleep/restless-only a few hours at night. Wheezing and Shortness of Breath -Walking.  Reports it is difficult to get a deep breath. Pt is on room air. Noted slight erythema. Pt denies dysphagia, reports its something hard to swallow. Reports "heart burn" BP 135/89 mmHg  Pulse 56  Temp(Src) 98.1 F (36.7 C) (Oral)  Resp 16  Wt 129 lb 12.8 oz (58.877 kg)  SpO2 99%  LMP 07/11/2014

## 2014-07-17 ENCOUNTER — Encounter: Payer: Self-pay | Admitting: Internal Medicine

## 2014-07-17 ENCOUNTER — Encounter: Payer: Self-pay | Admitting: Radiation Oncology

## 2014-07-17 ENCOUNTER — Ambulatory Visit
Admission: RE | Admit: 2014-07-17 | Discharge: 2014-07-17 | Disposition: A | Discharge status: Home / Self Care | Payer: Medicaid Other | Source: Ambulatory Visit | Attending: Radiation Oncology | Admitting: Radiation Oncology

## 2014-07-17 ENCOUNTER — Encounter: Payer: Self-pay | Admitting: Nurse Practitioner

## 2014-07-17 ENCOUNTER — Telehealth: Payer: Self-pay | Admitting: Nurse Practitioner

## 2014-07-17 VITALS — BP 131/75 | HR 63 | Temp 98.3°F | Resp 20 | Wt 131.1 lb

## 2014-07-17 DIAGNOSIS — C3491 Malignant neoplasm of unspecified part of right bronchus or lung: Secondary | ICD-10-CM | POA: Insufficient documentation

## 2014-07-17 DIAGNOSIS — Z87891 Personal history of nicotine dependence: Secondary | ICD-10-CM | POA: Diagnosis not present

## 2014-07-17 DIAGNOSIS — K0889 Other specified disorders of teeth and supporting structures: Secondary | ICD-10-CM | POA: Insufficient documentation

## 2014-07-17 MED ORDER — BIAFINE EX EMUL
CUTANEOUS | Status: DC | PRN
Start: 2014-07-17 — End: 2014-07-18
  Administered 2014-07-17: 10:00:00 via TOPICAL

## 2014-07-17 NOTE — Assessment & Plan Note (Addendum)
Patiently recently diagnosed with lung cancer.  She will initiate weekly carboplatin/Taxol chemotherapy regimen today.  Briefly review all labs obtained prior to her first chemotherapy treatment revealed a leuko-cytosis secondary to recent steroid use.  She is also undergoing radiation treatment on a daily basis as well.  Her final radiation treatment is scheduled for 08/20/2014.  She has plans to return for cycle 2 of her carboplatin/Taxol chemotherapy on 07/23/2014.

## 2014-07-17 NOTE — Addendum Note (Signed)
Encounter addended by: Doreen Beam, RN on: 07/17/2014 10:06 AM<BR>     Documentation filed: Inpatient MAR

## 2014-07-17 NOTE — Addendum Note (Signed)
Encounter addended by: Doreen Beam, RN on: 07/17/2014 10:04 AM<BR>     Documentation filed: Inpatient MAR

## 2014-07-17 NOTE — Telephone Encounter (Signed)
Called patient on 2 different occasions to let her know have obtained a dental exam with Dr.Koelling at [facility] in [place] for this coming Wednesday, 07/18/2014 at 11:30.  Have also prescribed Keflex and a box for the patient to initiate as well.  Will fax a copy of all notes in recent labs to the dental office prior to patient's visit.  We'll continue to try reaching the patient to make sure she has obtained this message; so she can have further dental evaluation and care.

## 2014-07-17 NOTE — Progress Notes (Addendum)
Radiation txs right lung 6  Completed, pt education done, book, biafine cream with instructions of use of product, discussed ways to manage side effects, patient is having some heartburn and difficulty swallowing some foods, had 1st  Chemotherapy  Yesterday stated, starting Keflex today for tooth infection,.all questions answered, teach back, fatigue, skin irritation, pain, throat changes, difficulty swallowing, may need to eat smaller meals 5-6 and snacks throughout the day, incrase protein in diet, dtay hydrated, minimize fried, greasy spicy foods, drink plenty water, gave Cherl Cheston RN business card , has dry cough 9:02 AM  9:01 AM

## 2014-07-17 NOTE — Progress Notes (Signed)
SYMPTOM MANAGEMENT CLINIC   HPI: Barbara Frost 44 y.o. female diagnosed with lung cancer.  Here today to initiate weekly carboplatin/Taxol chemotherapy regimen.  Also undergoing daily radiation treatments.   Patient presented to the cancer Center today to initiate her first cycle of carboplatin/Taxol chemotherapy.  She is complaining of a 2 to three-day history of right lower tooth dental pain.  She denies any known injury or trauma to her tooth.  She has a large cavity to this tooth; and the patient feels the feeling has cracked.  She denies any other new symptoms whatsoever.  She denies any recent fevers or chills.  HPI  ROS  Past Medical History  Diagnosis Date  . 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 Femur closed reduction Right     has Pulmonary mass; Lobar pneumonia: CAP; Leukocytosis; Anemia; Thrombocytosis; Tobacco abuse; Chest pain on respiration; Acute pneumonitis; Lung mass; Non-small cell carcinoma of lung, stage 3; and Pain, dental on her problem list.    has No Known Allergies.    Medication List       This list is accurate as of: 07/16/14 11:59 PM.  Always use your most recent med list.               aspirin 325 MG EC tablet  Take 325 mg by mouth every 6 (six) hours as needed for pain.     cephALEXin 500 MG capsule  Commonly known as:  KEFLEX  Take 1 capsule (500 mg total) by mouth 4 (four) times daily.     dexamethasone 4 MG tablet  Commonly known as:  DECADRON  Take 1 tablet (4 mg total) by mouth 2 (two) times daily.     dextromethorphan 30 MG/5ML liquid  Commonly known as:  DELSYM  Take 30 mg by mouth as needed for cough.     docusate sodium 100 MG capsule  Commonly known as:  COLACE  Take 1 capsule (100 mg total) by mouth 2 (two) times daily.     guaiFENesin 100 MG/5ML liquid  Commonly known as:  ROBITUSSIN  Take 200 mg by mouth 3 (three) times daily as needed for cough.     ibuprofen 600 MG tablet  Commonly known as:  ADVIL,MOTRIN  Take 1 tablet (600 mg total) by mouth 4 (four) times daily. Take for 4 days then use as needed.     iron polysaccharides 150 MG capsule  Commonly known as:  NIFEREX  Take 1 capsule (150 mg total) by mouth daily.     oxyCODONE-acetaminophen 5-325 MG per tablet  Commonly known as:  PERCOCET/ROXICET  Take 1 tablet by mouth every 6 (six) hours as needed for severe pain.     pantoprazole 40 MG tablet  Commonly known as:  PROTONIX  Take 1 tablet (40 mg total) by mouth daily at 6 (six) AM.     prochlorperazine 10 MG tablet  Commonly known as:  COMPAZINE  Take 1 tablet (10 mg total) by mouth every 6 (six) hours as needed for nausea or vomiting.     vitamin C 500 MG tablet  Commonly known as:  ASCORBIC ACID  Take 500 mg by mouth daily.         PHYSICAL EXAMINATION  Oncology Vitals 07/17/2014 07/16/2014 07/16/2014 07/16/2014 07/16/2014 07/16/2014 07/16/2014  Height - - - - - - -  Weight 59.467 kg - - - - - -  Weight (lbs) 131  lbs 2 oz - - - - - -  BMI (kg/m2) - - - - - - -  Temp 98.3 97.2 98.2 97.3 97.9 97.2 98.2  Pulse 63 56 63 52 55 56 59  Resp $Rem'20 18 18 18 18 18 18  'MCPQ$ SpO2 100 99 98 98 98 99 99  BSA (m2) - - - - - - -   BP Readings from Last 3 Encounters:  07/17/14 131/75  07/16/14 119/80  07/16/14 135/89    Physical Exam  Constitutional: She is oriented to person, place, and time and well-developed, well-nourished, and in no distress.  HENT:  Head: Normocephalic and atraumatic.  All teeth in various stages of decay.  Right lower tooth with large feeling noted.  It does appear that there is a crack in the feeling of this tooth; and this tooth is slightly tender with palpation.  There is no evidence of erythema or edema/abscess to the surrounding area.  Eyes: Conjunctivae and EOM are normal. Pupils are equal, round, and reactive to light. Right eye exhibits no discharge. Left eye exhibits no discharge. No scleral icterus.    Neck: Normal range of motion.  Pulmonary/Chest: Effort normal. No respiratory distress.  Musculoskeletal: Normal range of motion.  Neurological: She is alert and oriented to person, place, and time. Gait normal.  Skin: Skin is warm and dry.  Psychiatric: Affect normal.  Nursing note and vitals reviewed.   LABORATORY DATA:. Appointment on 07/16/2014  Component Date Value Ref Range Status  . Sodium 07/16/2014 139  136 - 145 mEq/L Final  . Potassium 07/16/2014 4.1  3.5 - 5.1 mEq/L Final  . Chloride 07/16/2014 106  98 - 109 mEq/L Final  . CO2 07/16/2014 22  22 - 29 mEq/L Final  . Glucose 07/16/2014 119  70 - 140 mg/dl Final  . BUN 07/16/2014 20.8  7.0 - 26.0 mg/dL Final  . Creatinine 07/16/2014 0.7  0.6 - 1.1 mg/dL Final  . Total Bilirubin 07/16/2014 0.32  0.20 - 1.20 mg/dL Final  . Alkaline Phosphatase 07/16/2014 81  40 - 150 U/L Final  . AST 07/16/2014 15  5 - 34 U/L Final  . ALT 07/16/2014 46  0 - 55 U/L Final  . Total Protein 07/16/2014 5.9* 6.4 - 8.3 g/dL Final  . Albumin 07/16/2014 2.7* 3.5 - 5.0 g/dL Final  . Calcium 07/16/2014 8.4  8.4 - 10.4 mg/dL Final  . Anion Gap 07/16/2014 10  3 - 11 mEq/L Final  . EGFR 07/16/2014 >90  >90 ml/min/1.73 m2 Final   eGFR is calculated using the CKD-EPI Creatinine Equation (2009)  . WBC 07/16/2014 35.8* 3.9 - 10.3 10e3/uL Final  . NEUT# 07/16/2014 31.5* 1.5 - 6.5 10e3/uL Final  . HGB 07/16/2014 11.4* 11.6 - 15.9 g/dL Final  . HCT 07/16/2014 36.8  34.8 - 46.6 % Final  . Platelets 07/16/2014 416* 145 - 400 10e3/uL Final  . MCV 07/16/2014 85.4  79.5 - 101.0 fL Final  . MCH 07/16/2014 26.3  25.1 - 34.0 pg Final  . MCHC 07/16/2014 30.8* 31.5 - 36.0 g/dL Final  . RBC 07/16/2014 4.31  3.70 - 5.45 10e6/uL Final  . RDW 07/16/2014 24.3* 11.2 - 14.5 % Final  . lymph# 07/16/2014 0.9  0.9 - 3.3 10e3/uL Final  . MONO# 07/16/2014 3.1* 0.1 - 0.9 10e3/uL Final  . Eosinophils Absolute 07/16/2014 0.0  0.0 - 0.5 10e3/uL Final  . Basophils Absolute  07/16/2014 0.3* 0.0 - 0.1 10e3/uL Final  . NEUT% 07/16/2014 88.2* 38.4 -  76.8 % Final  . LYMPH% 07/16/2014 2.4* 14.0 - 49.7 % Final  . MONO% 07/16/2014 8.7  0.0 - 14.0 % Final  . EOS% 07/16/2014 0.0  0.0 - 7.0 % Final  . BASO% 07/16/2014 0.7  0.0 - 2.0 % Final  . Technologist Review 07/16/2014 Few Metas and Myelocytes present   Final     RADIOGRAPHIC STUDIES: No results found.  ASSESSMENT/PLAN:    Non-small cell carcinoma of lung, stage 3 Patiently recently diagnosed with lung cancer.  She will initiate weekly carboplatin/Taxol chemotherapy regimen today.  Briefly review all labs obtained prior to her first chemotherapy treatment revealed a leuko-cytosis secondary to recent steroid use.  She is also undergoing radiation treatment on a daily basis as well.  Her final radiation treatment is scheduled for 08/20/2014.  She has plans to return for cycle 2 of her carboplatin/Taxol chemotherapy on 07/23/2014.   Pain, dental Patient is complaining of some right lower.  The dental pain with onset of acute pain within the past few days.  On exam-patient has multiple teeth in various stages of decay.  Right lower tooth with feeling that appears to be possibly cracked; which is tender on exam.  There is no erythema or edema/abscess to surrounding site.  Prescribed Keflex antibiotics for patient to start.  Also, obtained a dental appointment with Dr.Koelling for this coming Wednesday, 07/18/2014 at 11:30.  Advice patient to make sure she keeps this dental appointment for further evaluation and management of all of her dental issues.  Will fax a copy of these notes as well as recent labs to dental office prior to patient's visit.   Patient stated understanding of all instructions; and was in agreement with this plan of care. The patient knows to call the clinic with any problems, questions or concerns.   Review/collaboration with Dr. Julien Nordmann regarding all aspects of patient's visit today.   Total  time spent with patient was 25 minutes;  with greater than 75 percent of that time spent in face to face counseling regarding patient's symptoms,  and coordination of care and follow up.  Disclaimer: This note was dictated with voice recognition software. Similar sounding words can inadvertently be transcribed and may not be corrected upon review.   Drue Second, NP 07/17/2014

## 2014-07-17 NOTE — Progress Notes (Signed)
Weekly Management Note:  Site: Right lung Current Dose:  1200  cGy Projected Dose: 6000  cGy  Narrative: The patient is seen today for routine under treatment assessment. CBCT/MVCT images/port films were reviewed. The chart was reviewed.   She began chemotherapy yesterday.  Her PET scan showed disease localized to the right lung.  She does report mild "heartburn".  This could be from her dexamethasone premedication yesterday.  I doubt that she has esophagitis at this point in time.  Her appetite is good.  Physical Examination:  Filed Vitals:   07/17/14 0847  BP: 131/75  Pulse: 63  Temp: 98.3 F (36.8 C)  Resp: 20  .  Weight: 131 lb 1.6 oz (59.467 kg).  No change.  Laboratory data: Lab Results  Component Value Date   WBC 35.8* 07/16/2014   HGB 11.4* 07/16/2014   HCT 36.8 07/16/2014   MCV 85.4 07/16/2014   PLT 416* 07/16/2014    Impression: Tolerating radiation therapy well.  Plan: Continue radiation therapy as planned.

## 2014-07-17 NOTE — Assessment & Plan Note (Signed)
Patient is complaining of some right lower.  The dental pain with onset of acute pain within the past few days.  On exam-patient has multiple teeth in various stages of decay.  Right lower tooth with feeling that appears to be possibly cracked; which is tender on exam.  There is no erythema or edema/abscess to surrounding site.  Prescribed Keflex antibiotics for patient to start.  Also, obtained a dental appointment with Dr.Koelling for this coming Wednesday, 07/18/2014 at 11:30.  Advice patient to make sure she keeps this dental appointment for further evaluation and management of all of her dental issues.  Will fax a copy of these notes as well as recent labs to dental office prior to patient's visit.

## 2014-07-17 NOTE — Progress Notes (Signed)
Left msg for pt to return my call to discuss financial assistance she may need.

## 2014-07-18 ENCOUNTER — Ambulatory Visit
Admission: RE | Admit: 2014-07-18 | Discharge: 2014-07-18 | Disposition: A | Discharge status: Home / Self Care | Payer: Medicaid Other | Source: Ambulatory Visit | Attending: Radiation Oncology | Admitting: Radiation Oncology

## 2014-07-18 DIAGNOSIS — C3491 Malignant neoplasm of unspecified part of right bronchus or lung: Secondary | ICD-10-CM | POA: Diagnosis not present

## 2014-07-19 ENCOUNTER — Ambulatory Visit
Admission: RE | Admit: 2014-07-19 | Discharge: 2014-07-19 | Disposition: A | Discharge status: Home / Self Care | Payer: Medicaid Other | Source: Ambulatory Visit | Attending: Radiation Oncology | Admitting: Radiation Oncology

## 2014-07-19 DIAGNOSIS — C3491 Malignant neoplasm of unspecified part of right bronchus or lung: Secondary | ICD-10-CM | POA: Diagnosis not present

## 2014-07-20 ENCOUNTER — Other Ambulatory Visit: Payer: Self-pay | Admitting: Medical Oncology

## 2014-07-20 ENCOUNTER — Ambulatory Visit
Admission: RE | Admit: 2014-07-20 | Discharge: 2014-07-20 | Disposition: A | Discharge status: Home / Self Care | Payer: Medicaid Other | Source: Ambulatory Visit | Attending: Radiation Oncology | Admitting: Radiation Oncology

## 2014-07-20 DIAGNOSIS — C3491 Malignant neoplasm of unspecified part of right bronchus or lung: Secondary | ICD-10-CM | POA: Diagnosis not present

## 2014-07-20 DIAGNOSIS — C3490 Malignant neoplasm of unspecified part of unspecified bronchus or lung: Secondary | ICD-10-CM

## 2014-07-23 ENCOUNTER — Encounter: Payer: Self-pay | Admitting: Radiation Oncology

## 2014-07-23 ENCOUNTER — Ambulatory Visit (HOSPITAL_BASED_OUTPATIENT_CLINIC_OR_DEPARTMENT_OTHER): Payer: Medicaid Other | Admitting: Oncology

## 2014-07-23 ENCOUNTER — Encounter: Payer: Self-pay | Admitting: Oncology

## 2014-07-23 ENCOUNTER — Ambulatory Visit
Admission: RE | Admit: 2014-07-23 | Discharge: 2014-07-23 | Disposition: A | Discharge status: Home / Self Care | Payer: MEDICAID | Source: Ambulatory Visit | Attending: Radiation Oncology | Admitting: Radiation Oncology

## 2014-07-23 ENCOUNTER — Other Ambulatory Visit (HOSPITAL_BASED_OUTPATIENT_CLINIC_OR_DEPARTMENT_OTHER): Payer: Medicaid Other

## 2014-07-23 ENCOUNTER — Ambulatory Visit
Admission: RE | Admit: 2014-07-23 | Discharge: 2014-07-23 | Disposition: A | Discharge status: Home / Self Care | Payer: Medicaid Other | Source: Ambulatory Visit | Attending: Radiation Oncology | Admitting: Radiation Oncology

## 2014-07-23 ENCOUNTER — Encounter: Payer: Self-pay | Admitting: Internal Medicine

## 2014-07-23 ENCOUNTER — Ambulatory Visit (HOSPITAL_BASED_OUTPATIENT_CLINIC_OR_DEPARTMENT_OTHER): Payer: Medicaid Other

## 2014-07-23 VITALS — BP 105/57 | HR 93 | Temp 98.4°F | Wt 131.7 lb

## 2014-07-23 VITALS — BP 107/66 | HR 116 | Temp 98.9°F | Resp 12 | Wt 131.5 lb

## 2014-07-23 DIAGNOSIS — C3491 Malignant neoplasm of unspecified part of right bronchus or lung: Secondary | ICD-10-CM

## 2014-07-23 DIAGNOSIS — Z720 Tobacco use: Secondary | ICD-10-CM

## 2014-07-23 DIAGNOSIS — K0889 Other specified disorders of teeth and supporting structures: Secondary | ICD-10-CM

## 2014-07-23 DIAGNOSIS — C3411 Malignant neoplasm of upper lobe, right bronchus or lung: Secondary | ICD-10-CM

## 2014-07-23 DIAGNOSIS — L270 Generalized skin eruption due to drugs and medicaments taken internally: Secondary | ICD-10-CM | POA: Diagnosis not present

## 2014-07-23 DIAGNOSIS — Z5111 Encounter for antineoplastic chemotherapy: Secondary | ICD-10-CM

## 2014-07-23 DIAGNOSIS — C3490 Malignant neoplasm of unspecified part of unspecified bronchus or lung: Secondary | ICD-10-CM

## 2014-07-23 LAB — COMPREHENSIVE METABOLIC PANEL (CC13)
ALBUMIN: 2.7 g/dL — AB (ref 3.5–5.0)
ALT: 46 U/L (ref 0–55)
AST: 24 U/L (ref 5–34)
Alkaline Phosphatase: 80 U/L (ref 40–150)
Anion Gap: 10 mEq/L (ref 3–11)
BUN: 11.4 mg/dL (ref 7.0–26.0)
CALCIUM: 8.3 mg/dL — AB (ref 8.4–10.4)
CHLORIDE: 105 meq/L (ref 98–109)
CO2: 23 mEq/L (ref 22–29)
Creatinine: 0.6 mg/dL (ref 0.6–1.1)
EGFR: >90 mL/min/{1.73_m2} (ref >90)
Glucose: 127 mg/dl (ref 70–140)
Potassium: 3.6 mEq/L (ref 3.5–5.1)
SODIUM: 137 meq/L (ref 136–145)
TOTAL PROTEIN: 6.1 g/dL — AB (ref 6.4–8.3)
Total Bilirubin: 0.52 mg/dL (ref 0.20–1.20)

## 2014-07-23 LAB — CBC WITH DIFFERENTIAL/PLATELET
BASO%: 0.4 % (ref 0.0–2.0)
Basophils Absolute: 0 10*3/uL (ref 0.0–0.1)
EOS%: 2.9 % (ref 0.0–7.0)
Eosinophils Absolute: 0.2 10*3/uL (ref 0.0–0.5)
HEMATOCRIT: 33.8 % — AB (ref 34.8–46.6)
HGB: 10.6 g/dL — ABNORMAL LOW (ref 11.6–15.9)
LYMPH%: 9.5 % — ABNORMAL LOW (ref 14.0–49.7)
MCH: 27.6 pg (ref 25.1–34.0)
MCHC: 31.4 g/dL — AB (ref 31.5–36.0)
MCV: 88.1 fL (ref 79.5–101.0)
MONO#: 0.8 10*3/uL (ref 0.1–0.9)
MONO%: 10.3 % (ref 0.0–14.0)
NEUT#: 6 10*3/uL (ref 1.5–6.5)
NEUT%: 76.9 % — AB (ref 38.4–76.8)
Platelets: 214 10*3/uL (ref 145–400)
RBC: 3.84 10*6/uL (ref 3.70–5.45)
RDW: 27.8 % — AB (ref 11.2–14.5)
WBC: 7.8 10*3/uL (ref 3.9–10.3)
lymph#: 0.7 10*3/uL — ABNORMAL LOW (ref 0.9–3.3)

## 2014-07-23 MED ORDER — FAMOTIDINE IN NACL 20-0.9 MG/50ML-% IV SOLN
20.0000 mg | Freq: Once | INTRAVENOUS | Status: AC
  Administered 2014-07-23: 20 mg via INTRAVENOUS

## 2014-07-23 MED ORDER — PROCHLORPERAZINE MALEATE 10 MG PO TABS: 10.0000 mg | ORAL_TABLET | Freq: Four times a day (QID) | ORAL | Status: DC | PRN

## 2014-07-23 MED ORDER — FAMOTIDINE IN NACL 20-0.9 MG/50ML-% IV SOLN
INTRAVENOUS | Status: AC
  Filled 2014-07-23: qty 50

## 2014-07-23 MED ORDER — PACLITAXEL CHEMO INJECTION 300 MG/50ML
45.0000 mg/m2 | Freq: Once | INTRAVENOUS | Status: AC
  Administered 2014-07-23: 72 mg via INTRAVENOUS
  Filled 2014-07-23: qty 12

## 2014-07-23 MED ORDER — METHYLPREDNISOLONE (PAK) 4 MG PO TABS: ORAL_TABLET | ORAL | Status: DC

## 2014-07-23 MED ORDER — DIPHENHYDRAMINE HCL 50 MG/ML IJ SOLN
INTRAMUSCULAR | Status: AC
  Filled 2014-07-23: qty 1

## 2014-07-23 MED ORDER — SODIUM CHLORIDE 0.9 % IV SOLN
Freq: Once | INTRAVENOUS | Status: AC
  Administered 2014-07-23: 10:00:00 via INTRAVENOUS

## 2014-07-23 MED ORDER — CARBOPLATIN CHEMO INJECTION 450 MG/45ML
210.0000 mg | Freq: Once | INTRAVENOUS | Status: AC
  Administered 2014-07-23: 210 mg via INTRAVENOUS
  Filled 2014-07-23: qty 21

## 2014-07-23 MED ORDER — DIPHENHYDRAMINE HCL 50 MG/ML IJ SOLN
50.0000 mg | Freq: Once | INTRAMUSCULAR | Status: AC
  Administered 2014-07-23: 50 mg via INTRAVENOUS

## 2014-07-23 MED ORDER — SODIUM CHLORIDE 0.9 % IV SOLN
Freq: Once | INTRAVENOUS | Status: AC
  Administered 2014-07-23: 10:00:00 via INTRAVENOUS
  Filled 2014-07-23: qty 8

## 2014-07-23 NOTE — Patient Instructions (Signed)
[facility] Discharge Instructions for Patients Receiving Chemotherapy  Today you received the following chemotherapy agents:  Taxol and Carboplatin  To help prevent nausea and vomiting after your treatment, we encourage you to take your nausea medication:  Compazine 10 mg every 6 hours as needed.   If you develop nausea and vomiting that is not controlled by your nausea medication, call the clinic.   BELOW ARE SYMPTOMS THAT SHOULD BE REPORTED IMMEDIATELY:  *FEVER GREATER THAN 100.5 F  *CHILLS WITH OR WITHOUT FEVER  NAUSEA AND VOMITING THAT IS NOT CONTROLLED WITH YOUR NAUSEA MEDICATION  *UNUSUAL SHORTNESS OF BREATH  *UNUSUAL BRUISING OR BLEEDING  TENDERNESS IN MOUTH AND THROAT WITH OR WITHOUT PRESENCE OF ULCERS  *URINARY PROBLEMS  *BOWEL PROBLEMS  UNUSUAL RASH Items with * indicate a potential emergency and should be followed up as soon as possible.  Feel free to call the clinic you have any questions or concerns. The clinic phone number is [phone].

## 2014-07-23 NOTE — Progress Notes (Signed)
Weekly Management Note:  Site: Right lung, upper lobe Current Dose:  2000  cGy Projected Dose: 6000  cGy  Narrative: The patient is seen today for routine under treatment assessment. CBCT/MVCT images/port films were reviewed. The chart was reviewed.   This past week she developed a pruritic rash along her entire back and upper chest.  She has been using Biafine cream.  The rash is quite pruritic.  She also has mild upper esophageal dysphagia.  Her weight is stable from last week.  She is scheduled for more chemotherapy today.  Physical Examination:  Filed Vitals:   07/23/14 0844  BP: 107/66  Pulse: 116  Temp: 98.9 F (37.2 C)  Resp: 12  .  Weight: 131 lb 8 oz (59.648 kg).  There is a papular erythematous rash along her entire back and upper chest.  The rash extends well outside her radiation therapy field, particularly along the lower back.  This has the appearance of a drug rash rather than radiation therapy.  There are diminished breath sounds along the right upper lung and her come beam CT does not show any interval change or improved aeration of her right upper lobe.  Laboratory data: Lab Results  Component Value Date   WBC 7.8 07/23/2014   HGB 10.6* 07/23/2014   HCT 33.8* 07/23/2014   MCV 88.1 07/23/2014   PLT 214 07/23/2014     Impression: Tolerating radiation therapy well, however, she has developed what believed to be a drug rash.  She will have medical oncology looking at her rash before her next cycle of chemotherapy later today.  Plan: Continue radiation therapy as planned.

## 2014-07-23 NOTE — Progress Notes (Addendum)
She rates her pain as a 5 on a scale of 0-10. constant and sharp over bilateral knees/feet. Pt complains ofShortness of Breath  Walking. Pt is on room air. Noted diffuse acne-like rash over upper and lower back and upper chest. Pt is currently using Biafine to help treat rash.  Rash started on approx 07/18/14.  Pt complaining of pruritus and sore in the corners of her mouth.   Pt has had dysphagia for both solids and liquids nothing has become stuck, but feels a lump sensation when swallowing.  Pt reports drinking from a straw helps.  BP 107/66 mmHg  Pulse 116  Temp(Src) 98.9 F (37.2 C) (Oral)  Resp 12  Wt 131 lb 8 oz (59.648 kg)  SpO2 100%  LMP 07/11/2014

## 2014-07-23 NOTE — Progress Notes (Signed)
No drug replacment for taxol-carbo

## 2014-07-23 NOTE — Progress Notes (Signed)
OFFICE PROGRESS NOTE  DIAGNOSIS:  stage IIIa (T3, N2, M0) non-small cell lung cancer, squamous cell carcinoma presented with large right upper lobe mass with questionable mediastinal invasion diagnosed in February 2016.  HPI: Barbara Frost 44 y.o. female with lung cancer.  She initiated chemotherapy with carboplatin and Taxol on 07/16/2014. This is given concurrently with radiation. She is here for week 2 today. Tolerated the first week well. Last week she was given a protrusion for Keflex for a dental issue and subsequently developed a rash. She is continue to take the Keflex however. Rashes to her back in her chest. It does itch at times. She denies any shortness of breath or wheezing. Reports that her tooth is better, but she has been having difficulty seeing a dentist due to insurance issues. She is going to working on her insurance issues later today. Chest pain, shortness of breath, abdominal pain, nausea, vomiting, neuropathy.   HPI  Review of Systems  Constitutional: Negative.  Negative for fever, chills, weight loss, malaise/fatigue and diaphoresis.  HENT: Negative.   Eyes: Negative.   Respiratory: Negative.   Cardiovascular: Negative.   Gastrointestinal: Negative.   Genitourinary: Negative.   Musculoskeletal: Negative.   Skin: Positive for itching and rash.  Neurological: Negative.  Negative for weakness.  Endo/Heme/Allergies: Negative.   Psychiatric/Behavioral: Negative.     Past Medical History  Diagnosis Date  . 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 Femur closed reduction Right     has Pulmonary mass; Lobar pneumonia: CAP; Leukocytosis; Anemia; Thrombocytosis; Tobacco abuse; Chest pain on respiration; Acute pneumonitis; Lung mass; Non-small cell carcinoma of lung, stage 3; and Pain, dental on her problem list.    has No Known Allergies.    Medication List       This list is accurate as of:  07/23/14  9:27 AM.  Always use your most recent med list.               aspirin 325 MG EC tablet  Take 325 mg by mouth every 6 (six) hours as needed for pain.     cephALEXin 500 MG capsule  Commonly known as:  KEFLEX  Take 1 capsule (500 mg total) by mouth 4 (four) times daily.     dexamethasone 4 MG tablet  Commonly known as:  DECADRON  Take 1 tablet (4 mg total) by mouth 2 (two) times daily.     dextromethorphan 30 MG/5ML liquid  Commonly known as:  DELSYM  Take 30 mg by mouth as needed for cough.     docusate sodium 100 MG capsule  Commonly known as:  COLACE  Take 1 capsule (100 mg total) by mouth 2 (two) times daily.     emollient cream  Commonly known as:  BIAFINE  Apply 1 application topically daily.     guaiFENesin 100 MG/5ML liquid  Commonly known as:  ROBITUSSIN  Take 200 mg by mouth 3 (three) times daily as needed for cough.     ibuprofen 600 MG tablet  Commonly known as:  ADVIL,MOTRIN  Take 1 tablet (600 mg total) by mouth 4 (four) times daily. Take for 4 days then use as needed.     iron polysaccharides 150 MG capsule  Commonly known as:  NIFEREX  Take 1 capsule (150 mg total) by mouth daily.     oxyCODONE-acetaminophen 5-325 MG per tablet  Commonly known as:  PERCOCET/ROXICET  Take 1 tablet  by mouth every 6 (six) hours as needed for severe pain.     pantoprazole 40 MG tablet  Commonly known as:  PROTONIX  Take 1 tablet (40 mg total) by mouth daily at 6 (six) AM.     prochlorperazine 10 MG tablet  Commonly known as:  COMPAZINE  Take 1 tablet (10 mg total) by mouth every 6 (six) hours as needed for nausea or vomiting.     vitamin C 500 MG tablet  Commonly known as:  ASCORBIC ACID  Take 500 mg by mouth daily.         PHYSICAL EXAMINATION  Oncology Vitals 07/23/2014 07/17/2014 07/16/2014 07/16/2014 07/16/2014 07/16/2014 07/16/2014  Height - - - - - - -  Weight 59.648 kg 59.467 kg - - - - -  Weight (lbs) 131 lbs 8 oz 131 lbs 2 oz - - - - -  BMI  (kg/m2) - - - - - - -  Temp 98.9 98.3 97.2 98.2 97.3 97.9 97.2  Pulse 116 63 56 63 52 55 56  Resp $Rem'12 20 18 18 18 18 18  'PeoC$ SpO2 100 100 99 98 98 98 99  BSA (m2) - - - - - - -   BP Readings from Last 3 Encounters:  07/23/14 107/66  07/17/14 131/75  07/16/14 119/80    Physical Exam  Constitutional: She is oriented to person, place, and time and well-developed, well-nourished, and in no distress.  HENT:  Head: Normocephalic and atraumatic.  Eyes: Conjunctivae and EOM are normal. Pupils are equal, round, and reactive to light. Right eye exhibits no discharge. Left eye exhibits no discharge. No scleral icterus.  Neck: Normal range of motion.  Pulmonary/Chest: Effort normal. No respiratory distress.  Musculoskeletal: Normal range of motion.  Neurological: She is alert and oriented to person, place, and time. Gait normal.  Skin: Skin is warm and dry.  Psychiatric: Affect normal.  Nursing note and vitals reviewed.   LABORATORY DATA:. Appointment on 07/23/2014  Component Date Value Ref Range Status  . WBC 07/23/2014 7.8  3.9 - 10.3 10e3/uL Final  . NEUT# 07/23/2014 6.0  1.5 - 6.5 10e3/uL Final  . HGB 07/23/2014 10.6* 11.6 - 15.9 g/dL Final  . HCT 07/23/2014 33.8* 34.8 - 46.6 % Final  . Platelets 07/23/2014 214  145 - 400 10e3/uL Final  . MCV 07/23/2014 88.1  79.5 - 101.0 fL Final  . MCH 07/23/2014 27.6  25.1 - 34.0 pg Final  . MCHC 07/23/2014 31.4* 31.5 - 36.0 g/dL Final  . RBC 07/23/2014 3.84  3.70 - 5.45 10e6/uL Final  . RDW 07/23/2014 27.8* 11.2 - 14.5 % Final  . lymph# 07/23/2014 0.7* 0.9 - 3.3 10e3/uL Final  . MONO# 07/23/2014 0.8  0.1 - 0.9 10e3/uL Final  . Eosinophils Absolute 07/23/2014 0.2  0.0 - 0.5 10e3/uL Final  . Basophils Absolute 07/23/2014 0.0  0.0 - 0.1 10e3/uL Final  . NEUT% 07/23/2014 76.9* 38.4 - 76.8 % Final  . LYMPH% 07/23/2014 9.5* 14.0 - 49.7 % Final  . MONO% 07/23/2014 10.3  0.0 - 14.0 % Final  . EOS% 07/23/2014 2.9  0.0 - 7.0 % Final  . BASO% 07/23/2014  0.4  0.0 - 2.0 % Final  . Sodium 07/23/2014 137  136 - 145 mEq/L Final  . Potassium 07/23/2014 3.6  3.5 - 5.1 mEq/L Final  . Chloride 07/23/2014 105  98 - 109 mEq/L Final  . CO2 07/23/2014 23  22 - 29 mEq/L Final  . Glucose 07/23/2014 127  70 - 140 mg/dl Final  . BUN 07/23/2014 11.4  7.0 - 26.0 mg/dL Final  . Creatinine 07/23/2014 0.6  0.6 - 1.1 mg/dL Final  . Total Bilirubin 07/23/2014 0.52  0.20 - 1.20 mg/dL Final  . Alkaline Phosphatase 07/23/2014 80  40 - 150 U/L Final  . AST 07/23/2014 24  5 - 34 U/L Final  . ALT 07/23/2014 46  0 - 55 U/L Final  . Total Protein 07/23/2014 6.1* 6.4 - 8.3 g/dL Final  . Albumin 07/23/2014 2.7* 3.5 - 5.0 g/dL Final  . Calcium 07/23/2014 8.3* 8.4 - 10.4 mg/dL Final  . Anion Gap 07/23/2014 10  3 - 11 mEq/L Final  . EGFR 07/23/2014 >90  >90 ml/min/1.73 m2 Final   eGFR is calculated using the CKD-EPI Creatinine Equation (2009)     RADIOGRAPHIC STUDIES: No results found.  ASSESSMENT/PLAN:    No problem-specific assessment & plan notes found for this encounter.  This is a pleasant 44 year old female with stage IIIa (T3, N2, M0) non-small cell lung cancer, squamous cell carcinoma presented with large right upper lobe mass with questionable mediastinal invasion diagnosed in February 2016. She is currently receiving concurrent chemoradiation therapy. Her chemotherapy consisted of carboplatin and Taxol.   The patient was seen and examined with Dr. Julien Nordmann. She tolerated week 1 of her chemotherapy well. Recommend that she proceed with cycle 2 today as scheduled.  Her rash is likely due to the Keflex. She was instructed to stop this. She was given a prescription for a Medrol Dosepak and she may use Benadryl to help with the itching and may also apply either hydrocortisone or Benadryl cream to the rash.  She will return weekly for labs and chemotherapy. She will be seen for a visit in 2 weeks with her chemotherapy.  Patient stated understanding of all  instructions; and was in agreement with this plan of care. The patient knows to call the clinic with any problems, questions or concerns.    Mikey Bussing, NP 07/23/2014   ADDENDUM: Hematology/Oncology Attending: I had a face to face encounter with the patient. I recommended her care plan. This is a very pleasant 44 years old white female recently diagnosed with a stage IIIa non-small cell lung cancer, squamous cell carcinoma who is currently undergoing a course of concurrent chemoradiation with weekly carboplatin and paclitaxel. The patient related the first week of her treatment fairly well with no significant complaints. She has some recent dental issues and questionable abscess and was treated with a course of Keflex but unfortunately she developed significant rash on the chest and back as well as arms. I advised her to discontinue her treatment with Keflex. She is scheduled to see her dentist soon for evaluation of her dental issues. For the skin rash, will start the patient on Medrol Dosepak in addition to Benadryl. She will proceed with the second week of her chemoradiation as a scheduled. She would come back for follow-up visit in 2 weeks for reevaluation and management of any adverse effect of her treatment. She was advised to call immediately if she has any concerning symptoms in the interval.  Disclaimer: This note was dictated with voice recognition software. Similar sounding words can inadvertently be transcribed and may be missed upon review. Eilleen Kempf., MD 07/23/2014

## 2014-07-24 ENCOUNTER — Ambulatory Visit
Admission: RE | Admit: 2014-07-24 | Discharge: 2014-07-24 | Disposition: A | Discharge status: Home / Self Care | Payer: Medicaid Other | Source: Ambulatory Visit | Attending: Radiation Oncology | Admitting: Radiation Oncology

## 2014-07-24 DIAGNOSIS — C3491 Malignant neoplasm of unspecified part of right bronchus or lung: Secondary | ICD-10-CM | POA: Diagnosis not present

## 2014-07-25 ENCOUNTER — Ambulatory Visit
Admission: RE | Admit: 2014-07-25 | Discharge: 2014-07-25 | Disposition: A | Discharge status: Home / Self Care | Payer: Medicaid Other | Source: Ambulatory Visit | Attending: Radiation Oncology | Admitting: Radiation Oncology

## 2014-07-25 DIAGNOSIS — C3491 Malignant neoplasm of unspecified part of right bronchus or lung: Secondary | ICD-10-CM | POA: Diagnosis not present

## 2014-07-26 ENCOUNTER — Ambulatory Visit
Admission: RE | Admit: 2014-07-26 | Discharge: 2014-07-26 | Disposition: A | Discharge status: Home / Self Care | Payer: Medicaid Other | Source: Ambulatory Visit | Attending: Radiation Oncology | Admitting: Radiation Oncology

## 2014-07-26 DIAGNOSIS — C3491 Malignant neoplasm of unspecified part of right bronchus or lung: Secondary | ICD-10-CM | POA: Diagnosis not present

## 2014-07-27 ENCOUNTER — Other Ambulatory Visit: Payer: Self-pay | Admitting: *Deleted

## 2014-07-27 ENCOUNTER — Encounter: Payer: Self-pay | Admitting: *Deleted

## 2014-07-27 ENCOUNTER — Ambulatory Visit
Admission: RE | Admit: 2014-07-27 | Discharge: 2014-07-27 | Disposition: A | Discharge status: Home / Self Care | Payer: Medicaid Other | Source: Ambulatory Visit | Attending: Radiation Oncology | Admitting: Radiation Oncology

## 2014-07-27 DIAGNOSIS — C3490 Malignant neoplasm of unspecified part of unspecified bronchus or lung: Secondary | ICD-10-CM

## 2014-07-27 DIAGNOSIS — C3491 Malignant neoplasm of unspecified part of right bronchus or lung: Secondary | ICD-10-CM | POA: Diagnosis not present

## 2014-07-27 NOTE — Progress Notes (Signed)
[facility] Psychosocial Distress Screening Clinical Social Work  Clinical Social Work was referred by distress screening protocol.  The patient scored a 5 on the Psychosocial Distress Thermometer which indicates moderate distress. Clinical Social Worker attempted to contact patient to assess for distress and other psychosocial needs.   ONCBCN DISTRESS SCREENING 07/16/2014  Screening Type Initial Screening  Distress experienced in past week (1-10) 5  Emotional problem type Nervousness/Anxiety;Adjusting to illness;Boredom  Physician notified of physical symptoms Yes  Referral to clinical psychology Yes    Clinical Social Worker follow up needed: Yes.    If yes, follow up plan:  Patient's mother answered and stated patient is not available.  CSW requested patient return CSW call when convenient.  Polo Riley, MSW, LCSW, OSW-C Clinical Social Worker [facility] [phone]

## 2014-07-30 ENCOUNTER — Ambulatory Visit (HOSPITAL_BASED_OUTPATIENT_CLINIC_OR_DEPARTMENT_OTHER): Payer: Medicaid Other

## 2014-07-30 ENCOUNTER — Ambulatory Visit
Admission: RE | Admit: 2014-07-30 | Discharge: 2014-07-30 | Disposition: A | Discharge status: Home / Self Care | Payer: Medicaid Other | Source: Ambulatory Visit | Attending: Radiation Oncology | Admitting: Radiation Oncology

## 2014-07-30 ENCOUNTER — Encounter: Payer: Self-pay | Admitting: Radiation Oncology

## 2014-07-30 ENCOUNTER — Ambulatory Visit
Admission: RE | Admit: 2014-07-30 | Discharge: 2014-07-30 | Disposition: A | Discharge status: Home / Self Care | Payer: MEDICAID | Source: Ambulatory Visit | Attending: Radiation Oncology | Admitting: Radiation Oncology

## 2014-07-30 ENCOUNTER — Other Ambulatory Visit (HOSPITAL_BASED_OUTPATIENT_CLINIC_OR_DEPARTMENT_OTHER): Payer: Medicaid Other

## 2014-07-30 VITALS — BP 108/61 | HR 111 | Temp 98.3°F | Resp 12 | Wt 128.0 lb

## 2014-07-30 DIAGNOSIS — C3411 Malignant neoplasm of upper lobe, right bronchus or lung: Secondary | ICD-10-CM | POA: Diagnosis present

## 2014-07-30 DIAGNOSIS — Z5111 Encounter for antineoplastic chemotherapy: Secondary | ICD-10-CM | POA: Diagnosis not present

## 2014-07-30 DIAGNOSIS — C3491 Malignant neoplasm of unspecified part of right bronchus or lung: Secondary | ICD-10-CM | POA: Diagnosis not present

## 2014-07-30 DIAGNOSIS — C3490 Malignant neoplasm of unspecified part of unspecified bronchus or lung: Secondary | ICD-10-CM

## 2014-07-30 LAB — CBC WITH DIFFERENTIAL/PLATELET
BASO%: 0.2 % (ref 0.0–2.0)
Basophils Absolute: 0 10*3/uL (ref 0.0–0.1)
EOS%: 1.1 % (ref 0.0–7.0)
Eosinophils Absolute: 0.1 10*3/uL (ref 0.0–0.5)
HCT: 36.6 % (ref 34.8–46.6)
HGB: 11.8 g/dL (ref 11.6–15.9)
LYMPH#: 0.5 10*3/uL — AB (ref 0.9–3.3)
LYMPH%: 10.7 % — ABNORMAL LOW (ref 14.0–49.7)
MCH: 28.4 pg (ref 25.1–34.0)
MCHC: 32.2 g/dL (ref 31.5–36.0)
MCV: 88 fL (ref 79.5–101.0)
MONO#: 0.6 10*3/uL (ref 0.1–0.9)
MONO%: 13.5 % (ref 0.0–14.0)
NEUT#: 3.5 10*3/uL (ref 1.5–6.5)
NEUT%: 74.5 % (ref 38.4–76.8)
Platelets: 181 10*3/uL (ref 145–400)
RBC: 4.16 10*6/uL (ref 3.70–5.45)
RDW: 25 % — ABNORMAL HIGH (ref 11.2–14.5)
WBC: 4.8 10*3/uL (ref 3.9–10.3)

## 2014-07-30 LAB — COMPREHENSIVE METABOLIC PANEL (CC13)
ALBUMIN: 3 g/dL — AB (ref 3.5–5.0)
ALT: 37 U/L (ref 0–55)
AST: 20 U/L (ref 5–34)
Alkaline Phosphatase: 81 U/L (ref 40–150)
Anion Gap: 11 mEq/L (ref 3–11)
BUN: 18.8 mg/dL (ref 7.0–26.0)
CALCIUM: 8.7 mg/dL (ref 8.4–10.4)
CHLORIDE: 103 meq/L (ref 98–109)
CO2: 23 mEq/L (ref 22–29)
CREATININE: 0.7 mg/dL (ref 0.6–1.1)
EGFR: >90 mL/min/{1.73_m2} (ref >90)
Glucose: 86 mg/dl (ref 70–140)
POTASSIUM: 4.1 meq/L (ref 3.5–5.1)
SODIUM: 136 meq/L (ref 136–145)
Total Bilirubin: 0.7 mg/dL (ref 0.20–1.20)
Total Protein: 6.9 g/dL (ref 6.4–8.3)

## 2014-07-30 LAB — TECHNOLOGIST REVIEW

## 2014-07-30 MED ORDER — LIDOCAINE VISCOUS 2 % MT SOLN: OROMUCOSAL | Status: DC

## 2014-07-30 MED ORDER — SODIUM CHLORIDE 0.9 % IV SOLN
210.0000 mg | Freq: Once | INTRAVENOUS | Status: AC
  Administered 2014-07-30: 210 mg via INTRAVENOUS
  Filled 2014-07-30: qty 21

## 2014-07-30 MED ORDER — DEXAMETHASONE SODIUM PHOSPHATE 100 MG/10ML IJ SOLN
Freq: Once | INTRAMUSCULAR | Status: AC
  Administered 2014-07-30: 10:00:00 via INTRAVENOUS
  Filled 2014-07-30: qty 8

## 2014-07-30 MED ORDER — DIPHENHYDRAMINE HCL 50 MG/ML IJ SOLN
50.0000 mg | Freq: Once | INTRAMUSCULAR | Status: AC
  Administered 2014-07-30: 50 mg via INTRAVENOUS

## 2014-07-30 MED ORDER — DIPHENHYDRAMINE HCL 50 MG/ML IJ SOLN
INTRAMUSCULAR | Status: AC
  Filled 2014-07-30: qty 1

## 2014-07-30 MED ORDER — DEXTROSE 5 % IV SOLN
45.0000 mg/m2 | Freq: Once | INTRAVENOUS | Status: AC
  Administered 2014-07-30: 72 mg via INTRAVENOUS
  Filled 2014-07-30: qty 12

## 2014-07-30 MED ORDER — SODIUM CHLORIDE 0.9 % IV SOLN
Freq: Once | INTRAVENOUS | Status: AC
  Administered 2014-07-30: 10:00:00 via INTRAVENOUS

## 2014-07-30 MED ORDER — FAMOTIDINE IN NACL 20-0.9 MG/50ML-% IV SOLN
20.0000 mg | Freq: Once | INTRAVENOUS | Status: AC
  Administered 2014-07-30: 20 mg via INTRAVENOUS

## 2014-07-30 MED ORDER — FAMOTIDINE IN NACL 20-0.9 MG/50ML-% IV SOLN
INTRAVENOUS | Status: AC
  Filled 2014-07-30: qty 50

## 2014-07-30 MED ORDER — SUCRALFATE 1 G PO TABS: ORAL_TABLET | ORAL | Status: DC

## 2014-07-30 NOTE — Patient Instructions (Signed)
[facility] Discharge Instructions for Patients Receiving Chemotherapy  Today you received the following chemotherapy agents: Taxol and Carboplatin.  To help prevent nausea and vomiting after your treatment, we encourage you to take your nausea medication: Compazine. Take one every 6 hours as needed.   If you develop nausea and vomiting that is not controlled by your nausea medication, call the clinic.   BELOW ARE SYMPTOMS THAT SHOULD BE REPORTED IMMEDIATELY:  *FEVER GREATER THAN 100.5 F  *CHILLS WITH OR WITHOUT FEVER  NAUSEA AND VOMITING THAT IS NOT CONTROLLED WITH YOUR NAUSEA MEDICATION  *UNUSUAL SHORTNESS OF BREATH  *UNUSUAL BRUISING OR BLEEDING  TENDERNESS IN MOUTH AND THROAT WITH OR WITHOUT PRESENCE OF ULCERS  *URINARY PROBLEMS  *BOWEL PROBLEMS  UNUSUAL RASH Items with * indicate a potential emergency and should be followed up as soon as possible.  Feel free to call the clinic should you have any questions or concerns. The clinic phone number is [phone].  Please show the [facility] at check-in to the Emergency Department and triage nurse.

## 2014-07-30 NOTE — Progress Notes (Signed)
Weekly Management Note:   ICD-9-CM ICD-10-CM   1. Non-small cell carcinoma of lung, stage 3, unspecified laterality 162.9 C34.90 sucralfate (CARAFATE) 1 G tablet     lidocaine (XYLOCAINE) 2 % solution    Site: Right lung, upper lobe Current Dose: 3000 cGy Projected Dose: 6000  cGy  Narrative: The patient is seen today for routine under treatment assessment. CBCT/MVCT images/port films were reviewed. The chart was reviewed.   She is currently in no pain.  Pt complains of fatigue. Shortness of Breath  Walking and Coughing  Dry. Pt is on room air. Noted pruritic rash over entire back, upper chest and neck.  This was present during last weeks encounter.  Pt reports Dr. Earlie Server reports it was from the [place] she was taking to treat an infected tooth.  She has been off the Keflex for approximately one week and rash continues. Pt reports there has been no improvement to the rash, she is still experiencing Pruritus. She takes oral Benadryl as needed.  Pt denies dysphagia nothing has become stuck, but feels a lump sensation and soreness when swallowing. BP 108/61 mmHg  Pulse 111  Temp(Src) 98.3 F (36.8 C) (Oral)  Resp 12  Wt 128 lb (58.06 kg)  SpO2 99%  LMP 07/11/2014    Physical Examination:  Filed Vitals:   07/30/14 0852  BP: 108/61  Pulse: 111  Temp: 98.3 F (36.8 C)  Resp: 12  .  Weight: 128 lb (58.06 kg).  Acneiform erythematous rash throughout chest, back and shoulders. Beyond RT fields.  Laboratory data: Lab Results  Component Value Date   WBC 4.8 07/30/2014   HGB 11.8 07/30/2014   HCT 36.6 07/30/2014   MCV 88.0 07/30/2014   PLT 181 07/30/2014     Impression: Tolerating radiation therapy well   Plan: Continue radiation therapy as planned. PT will talk to med/onc about possible dermatology referral if they feel rash is not due to any of their agents.  Rx's as above (see top of note) for odynophagia.

## 2014-07-30 NOTE — Progress Notes (Signed)
She is currently in no pain.  Pt complains of fatigue. Shortness of Breath  Walking and Coughing  Dry. Pt is on room air. Noted pruritic rash over entire back, upper chest and neck.  This was present during last weeks encounter.  Pt reports Dr. Earlie Server reports it was from the [place] she was taking to treat an infected tooth.  She has been off the Keflex for approximately one week and rash continues. Pt reports there has been no improvement to the rash, she is still experiencing Pruritus. She takes oral Benadryl as needed.  Pt denies dysphagia nothing has become stuck, but feels a lump sensation when swallowing. BP 108/61 mmHg  Pulse 111  Temp(Src) 98.3 F (36.8 C) (Oral)  Resp 12  Wt 128 lb (58.06 kg)  SpO2 99%  LMP 07/11/2014

## 2014-07-31 ENCOUNTER — Ambulatory Visit
Admission: RE | Admit: 2014-07-31 | Discharge: 2014-07-31 | Disposition: A | Discharge status: Home / Self Care | Payer: Medicaid Other | Source: Ambulatory Visit | Attending: Radiation Oncology | Admitting: Radiation Oncology

## 2014-07-31 DIAGNOSIS — C3491 Malignant neoplasm of unspecified part of right bronchus or lung: Secondary | ICD-10-CM | POA: Diagnosis not present

## 2014-08-01 ENCOUNTER — Ambulatory Visit
Admission: RE | Admit: 2014-08-01 | Discharge: 2014-08-01 | Disposition: A | Discharge status: Home / Self Care | Payer: Medicaid Other | Source: Ambulatory Visit | Attending: Radiation Oncology | Admitting: Radiation Oncology

## 2014-08-01 DIAGNOSIS — C3491 Malignant neoplasm of unspecified part of right bronchus or lung: Secondary | ICD-10-CM | POA: Diagnosis not present

## 2014-08-02 ENCOUNTER — Ambulatory Visit (HOSPITAL_BASED_OUTPATIENT_CLINIC_OR_DEPARTMENT_OTHER): Payer: Medicaid Other | Admitting: Nurse Practitioner

## 2014-08-02 ENCOUNTER — Other Ambulatory Visit: Payer: Self-pay | Admitting: Medical Oncology

## 2014-08-02 ENCOUNTER — Ambulatory Visit
Admission: RE | Admit: 2014-08-02 | Discharge: 2014-08-02 | Disposition: A | Discharge status: Home / Self Care | Payer: Medicaid Other | Source: Ambulatory Visit | Attending: Radiation Oncology | Admitting: Radiation Oncology

## 2014-08-02 ENCOUNTER — Ambulatory Visit
Admission: RE | Admit: 2014-08-02 | Discharge: 2014-08-02 | Disposition: A | Discharge status: Home / Self Care | Payer: Self-pay | Source: Ambulatory Visit | Attending: Radiation Oncology | Admitting: Radiation Oncology

## 2014-08-02 ENCOUNTER — Encounter: Payer: Self-pay | Admitting: Radiation Oncology

## 2014-08-02 VITALS — BP 106/69 | HR 94 | Temp 98.8°F | Resp 18 | Wt 131.5 lb

## 2014-08-02 VITALS — BP 114/80 | HR 102 | Temp 98.9°F | Resp 20

## 2014-08-02 DIAGNOSIS — C3411 Malignant neoplasm of upper lobe, right bronchus or lung: Secondary | ICD-10-CM

## 2014-08-02 DIAGNOSIS — C3490 Malignant neoplasm of unspecified part of unspecified bronchus or lung: Secondary | ICD-10-CM

## 2014-08-02 DIAGNOSIS — K088 Other specified disorders of teeth and supporting structures: Secondary | ICD-10-CM | POA: Diagnosis not present

## 2014-08-02 DIAGNOSIS — C3491 Malignant neoplasm of unspecified part of right bronchus or lung: Secondary | ICD-10-CM

## 2014-08-02 DIAGNOSIS — R21 Rash and other nonspecific skin eruption: Secondary | ICD-10-CM

## 2014-08-02 DIAGNOSIS — R062 Wheezing: Secondary | ICD-10-CM

## 2014-08-02 DIAGNOSIS — K0889 Other specified disorders of teeth and supporting structures: Secondary | ICD-10-CM

## 2014-08-02 MED ORDER — ALBUTEROL SULFATE HFA 108 (90 BASE) MCG/ACT IN AERS: 1.0000 | INHALATION_SPRAY | Freq: Four times a day (QID) | RESPIRATORY_TRACT | Status: DC | PRN

## 2014-08-02 MED ORDER — ALBUTEROL SULFATE (2.5 MG/3ML) 0.083% IN NEBU
2.5000 mg | INHALATION_SOLUTION | Freq: Once | RESPIRATORY_TRACT | Status: DC
  Filled 2014-08-02: qty 3

## 2014-08-02 MED ORDER — METHYLPREDNISOLONE 4 MG PO KIT: PACK | ORAL | Status: DC

## 2014-08-02 MED ORDER — ALBUTEROL SULFATE (2.5 MG/3ML) 0.083% IN NEBU
INHALATION_SOLUTION | RESPIRATORY_TRACT | Status: AC
  Filled 2014-08-02: qty 3

## 2014-08-02 NOTE — Progress Notes (Signed)
Patient brought to nursing following radiation due to c/o "wheezing". She states she began wheezing 2 days ago, accompanied by productive cough with small amounts of "black sputum with some green". She states she has not been taking any cough medications. She denies history of seasonal allergies, bronchitis. She has sore throat which she attributes to radiation treatments. She manages her sore throat with Lidocaine and Sucralfate. She denies rhinorrhea, earache, SOB beyond her baseline. She does not use O2, nebulizer or inhalers. She denies smoking but states she drinks "really strong coffee" , has had 1 cup today.  She was on Keflex last week, but d/c due to reaction of rash. She completed a Medrol dose pack which was given to her for the rash. She states she is not taking Decadron which is on her medication list. She requests to see dr today.  BP 114/80 mmHg  Pulse 102  Temp(Src) 98.9 F (37.2 C) (Oral)  Resp 20  Wt   SpO2 100%  LMP 07/11/2014

## 2014-08-02 NOTE — Progress Notes (Signed)
Weekly Management Note Current Dose: 36 Gy  Projected Dose: 60 Gy   Narrative:  The patient presents for under treatment assessment.  She states she has had wheezing and productive cough for the past 2 days. Her chest feels tight. She denies fever or chills. She is not taking any medication for this. Her sister who brought her has to leave for work and someone will have to come back to get her.   Physical Findings:  Inspiratory and expiratory wheezes on auscultation. No rhonchi.   Vitals:  Filed Vitals:   08/02/14 0847  BP:   Pulse: 102  Temp:   Resp:    Weight:  Wt Readings from Last 3 Encounters:  07/30/14 128 lb (58.06 kg)  07/23/14 131 lb 11.2 oz (59.739 kg)  07/23/14 131 lb 8 oz (59.648 kg)   Lab Results  Component Value Date   WBC 4.8 07/30/2014   HGB 11.8 07/30/2014   HCT 36.6 07/30/2014   MCV 88.0 07/30/2014   PLT 181 07/30/2014   Lab Results  Component Value Date   CREATININE 0.7 07/30/2014   BUN 18.8 07/30/2014   NA 136 07/30/2014   K 4.1 07/30/2014   CL 110 07/03/2014   CO2 23 07/30/2014     Impression:  The patient is tolerating radiation with new complaints of wheezing.   Plan:  Continue treatment as planned. We have sent her to symptom management clinic for evaluation and nebulizer treatment.

## 2014-08-03 ENCOUNTER — Ambulatory Visit
Admission: RE | Admit: 2014-08-03 | Discharge: 2014-08-03 | Disposition: A | Discharge status: Home / Self Care | Payer: Medicaid Other | Source: Ambulatory Visit | Attending: Radiation Oncology | Admitting: Radiation Oncology

## 2014-08-03 ENCOUNTER — Encounter: Payer: Self-pay | Admitting: Nurse Practitioner

## 2014-08-03 DIAGNOSIS — R21 Rash and other nonspecific skin eruption: Secondary | ICD-10-CM | POA: Insufficient documentation

## 2014-08-03 DIAGNOSIS — C3491 Malignant neoplasm of unspecified part of right bronchus or lung: Secondary | ICD-10-CM | POA: Diagnosis not present

## 2014-08-03 DIAGNOSIS — R062 Wheezing: Secondary | ICD-10-CM | POA: Insufficient documentation

## 2014-08-03 NOTE — Assessment & Plan Note (Signed)
Patient received cycle 2 of her carboplatin/Taxol chemotherapy regimen on 07/23/2014.  She also continues to receive radiation treatments on a daily basis.  Patient is to return on 08/06/2014 for her next labs, visit, and chemotherapy.

## 2014-08-03 NOTE — Assessment & Plan Note (Signed)
Patient complained of some dental pain a few weeks ago; and started Keflex antibiotics.  However, patient did develop a generalized rash apparently from the Keflex antibiotics.  She discontinued all of the antibiotics.  Patient states that her dental pain has subsided; and she has plans to followup with a dentist soon.  Patient continues with a healing rash to her generalized body.  No evidence of infection to the rash.

## 2014-08-03 NOTE — Assessment & Plan Note (Signed)
Patient presented for radiation treatments today with recent onset wheezing and coughing.  Patient states that her cough is sometimes productive with yellow secretions.  She denies any shortness of breath or pain with inspiration.  Chest denies any recent fevers or chills.  Patient was recently on Keflex antibiotics for dental infection.  Patient states she developed a generalized rash to Keflex; and discontinue the Keflex.  On exam patient with wheezing to all lung fields.  Patient appears uncomfortable; but in no acute respiratory distress.  O2 sat remained stable. Patient was afebrile while at the [facility].  Patient was given 2 back to back albuterol nebulizer treatments; which almost completely resolved all wheezing.  Patient does not appear toxic; and vital signs stable following nebulizer treatment.  Recent onset wheezing be secondary to chemotherapy and recent radiation treatments.  Patient was prescribed albuterol inhalers and a Medrol Dosepak.  Patient was encouraged to call/return or go directly to the emergency perforated worsening symptoms whatsoever.

## 2014-08-03 NOTE — Assessment & Plan Note (Signed)
Patient initiated Keflex Anaprox last week for complaint of dental infection.  However, patient did develop a generalized body rash when she took the Keflex.  She discontinued all of the antibiotics; and it does appear that the rash is slowly resolving.  There is no infection noted to the rash areas.

## 2014-08-03 NOTE — Progress Notes (Signed)
SYMPTOM MANAGEMENT CLINIC   HPI: Barbara Frost 44 y.o. female diagnosed with lung cancer.  Currently undergoing carboplatin/Taxol chemotherapy regimen and radiation therapy.  Patient presented to the cancer Center to receive her radiation treatment today; was noted to have increased pain to bilateral lung fields.  Patient does report an occasional productive cough with some yellow/green secretions; but denies any recent fevers or chills.  Also, patient recently initiated on Keflex antibiotics for dental infection.  However, patient developed a generalized rash after taking the Keflex.  She discontinued the Keflex; and the rash does appear to be resolving.  Also, patient states that her dental pain hasn't resolved.  She plans to follow up with a dentist soon.   HPI  ROS  Past Medical History  Diagnosis Date  . 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 Femur closed reduction Right     has Pulmonary mass; Lobar pneumonia: CAP; Leukocytosis; Anemia; Thrombocytosis; Tobacco abuse; Chest pain on respiration; Acute pneumonitis; Lung mass; Non-small cell carcinoma of lung, stage 3; Pain, dental; Drug-induced skin rash; Wheezing; and Rash on her problem list.    is allergic to keflex.    Medication List       This list is accurate as of: 08/02/14 11:59 PM.  Always use your most recent med list.               albuterol 108 (90 BASE) MCG/ACT inhaler  Commonly known as:  PROVENTIL HFA;VENTOLIN HFA  Inhale 1-2 puffs into the lungs every 6 (six) hours as needed for wheezing or shortness of breath.     aspirin 325 MG EC tablet  Take 325 mg by mouth every 6 (six) hours as needed for pain.     dextromethorphan 30 MG/5ML liquid  Commonly known as:  DELSYM  Take 30 mg by mouth as needed for cough.     docusate sodium 100 MG capsule  Commonly known as:  COLACE  Take 1 capsule (100 mg total) by mouth 2 (two) times daily.     emollient cream  Commonly known as:  BIAFINE  Apply 1 application topically daily.     guaiFENesin 100 MG/5ML liquid  Commonly known as:  ROBITUSSIN  Take 200 mg by mouth 3 (three) times daily as needed for cough.     ibuprofen 600 MG tablet  Commonly known as:  ADVIL,MOTRIN  Take 1 tablet (600 mg total) by mouth 4 (four) times daily. Take for 4 days then use as needed.     iron polysaccharides 150 MG capsule  Commonly known as:  NIFEREX  Take 1 capsule (150 mg total) by mouth daily.     lidocaine 2 % solution  Commonly known as:  XYLOCAINE  Patient: Mix 1part 2% viscous lidocaine, 1part H20. Swish and/or swallow 31mL of this mixture, before meals and at bedtime, up to QID     methylPREDNISolone 4 MG tablet  Commonly known as:  MEDROL DOSEPAK  follow package directions     oxyCODONE-acetaminophen 5-325 MG per tablet  Commonly known as:  PERCOCET/ROXICET  Take 1 tablet by mouth every 6 (six) hours as needed for severe pain.     pantoprazole 40 MG tablet  Commonly known as:  PROTONIX  Take 1 tablet (40 mg total) by mouth daily at 6 (six) AM.     prochlorperazine 10 MG tablet  Commonly known as:  COMPAZINE  Take 1 tablet (10 mg  total) by mouth every 6 (six) hours as needed for nausea or vomiting.     sucralfate 1 G tablet  Commonly known as:  CARAFATE  Dissolve 1 tablet in 10 mL H20 and swallow up to QID PRN soreness with swallowing     vitamin C 500 MG tablet  Commonly known as:  ASCORBIC ACID  Take 500 mg by mouth daily.         PHYSICAL EXAMINATION  Oncology Vitals 08/02/2014 08/02/2014 08/02/2014 07/30/2014 07/30/2014 07/23/2014 07/23/2014  Height - - - - - - -  Weight 59.648 kg - (No Data) - 58.06 kg 59.739 kg -  Weight (lbs) 131 lbs 8 oz - (No Data) - 128 lbs 131 lbs 11 oz -  BMI (kg/m2) - - - - - - -  Temp 98.8 - 98.9 98.7 98.3 98.4 98.8  Pulse 94 102 114 95 111 93 94  Resp 18 - 20 - 12 - 18  SpO2 - 100 100 - 99 100 98  BSA (m2) - - - - - - -   BP  Readings from Last 3 Encounters:  08/02/14 106/69  07/30/14 106/69  07/30/14 108/61    Physical Exam  Constitutional: She is oriented to person, place, and time and well-developed, well-nourished, and in no distress.  HENT:  Head: Normocephalic and atraumatic.  Mouth/Throat: Oropharynx is clear and moist.  Eyes: Conjunctivae and EOM are normal. Pupils are equal, round, and reactive to light. Right eye exhibits no discharge. Left eye exhibits no discharge. No scleral icterus.  Neck: Normal range of motion. Neck supple. No JVD present. No tracheal deviation present. No thyromegaly present.  Cardiovascular: Normal rate, regular rhythm, normal heart sounds and intact distal pulses.   Pulmonary/Chest: No respiratory distress. She has wheezes. She has no rales. She exhibits no tenderness.  Patient with wheezing to all lung fields.  Patient appears uncomfortable; but no acute respiratory distress.    After albuterol nebulizer treatments x2-all wheezing essentially resolved.  Abdominal: Soft. Bowel sounds are normal. She exhibits no distension and no mass. There is no tenderness. There is no rebound and no guarding.  Musculoskeletal: Normal range of motion. She exhibits no edema or tenderness.  Lymphadenopathy:    She has no cervical adenopathy.  Neurological: She is alert and oriented to person, place, and time. Gait normal.  Skin: Skin is warm and dry. Rash noted. No erythema. No pallor.  Patient has a healing/resolving generalized rash to her body.  No evidence of infection.  Psychiatric: Affect normal.  Nursing note and vitals reviewed.   LABORATORY DATA:. No visits with results within 3 Day(s) from this visit. Latest known visit with results is:  Appointment on 07/30/2014  Component Date Value Ref Range Status  . WBC 07/30/2014 4.8  3.9 - 10.3 10e3/uL Final  . NEUT# 07/30/2014 3.5  1.5 - 6.5 10e3/uL Final  . HGB 07/30/2014 11.8  11.6 - 15.9 g/dL Final  . HCT 07/30/2014 36.6  34.8 -  46.6 % Final  . Platelets 07/30/2014 181  145 - 400 10e3/uL Final  . MCV 07/30/2014 88.0  79.5 - 101.0 fL Final  . MCH 07/30/2014 28.4  25.1 - 34.0 pg Final  . MCHC 07/30/2014 32.2  31.5 - 36.0 g/dL Final  . RBC 07/30/2014 4.16  3.70 - 5.45 10e6/uL Final  . RDW 07/30/2014 25.0* 11.2 - 14.5 % Final  . lymph# 07/30/2014 0.5* 0.9 - 3.3 10e3/uL Final  . MONO# 07/30/2014 0.6  0.1 - 0.9  10e3/uL Final  . Eosinophils Absolute 07/30/2014 0.1  0.0 - 0.5 10e3/uL Final  . Basophils Absolute 07/30/2014 0.0  0.0 - 0.1 10e3/uL Final  . NEUT% 07/30/2014 74.5  38.4 - 76.8 % Final  . LYMPH% 07/30/2014 10.7* 14.0 - 49.7 % Final  . MONO% 07/30/2014 13.5  0.0 - 14.0 % Final  . EOS% 07/30/2014 1.1  0.0 - 7.0 % Final  . BASO% 07/30/2014 0.2  0.0 - 2.0 % Final  . Sodium 07/30/2014 136  136 - 145 mEq/L Final  . Potassium 07/30/2014 4.1  3.5 - 5.1 mEq/L Final  . Chloride 07/30/2014 103  98 - 109 mEq/L Final  . CO2 07/30/2014 23  22 - 29 mEq/L Final  . Glucose 07/30/2014 86  70 - 140 mg/dl Final  . BUN 07/30/2014 18.8  7.0 - 26.0 mg/dL Final  . Creatinine 07/30/2014 0.7  0.6 - 1.1 mg/dL Final  . Total Bilirubin 07/30/2014 0.70  0.20 - 1.20 mg/dL Final  . Alkaline Phosphatase 07/30/2014 81  40 - 150 U/L Final  . AST 07/30/2014 20  5 - 34 U/L Final  . ALT 07/30/2014 37  0 - 55 U/L Final  . Total Protein 07/30/2014 6.9  6.4 - 8.3 g/dL Final  . Albumin 07/30/2014 3.0* 3.5 - 5.0 g/dL Final  . Calcium 07/30/2014 8.7  8.4 - 10.4 mg/dL Final  . Anion Gap 07/30/2014 11  3 - 11 mEq/L Final  . EGFR 07/30/2014 >90  >90 ml/min/1.73 m2 Final   eGFR is calculated using the CKD-EPI Creatinine Equation (2009)  . Technologist Review 07/30/2014 few myelos, Oc variant lymph   Final     RADIOGRAPHIC STUDIES: No results found.  ASSESSMENT/PLAN:    Wheezing Patient presented for radiation treatments today with recent onset wheezing and coughing.  Patient states that her cough is sometimes productive with yellow secretions.   She denies any shortness of breath or pain with inspiration.  Chest denies any recent fevers or chills.  Patient was recently on Keflex antibiotics for dental infection.  Patient states she developed a generalized rash to Keflex; and discontinue the Keflex.  On exam patient with wheezing to all lung fields.  Patient appears uncomfortable; but in no acute respiratory distress.  O2 sat remained stable. Patient was afebrile while at the [facility].  Patient was given 2 back to back albuterol nebulizer treatments; which almost completely resolved all wheezing.  Patient does not appear toxic; and vital signs stable following nebulizer treatment.  Recent onset wheezing be secondary to chemotherapy and recent radiation treatments.  Patient was prescribed albuterol inhalers and a Medrol Dosepak.  Patient was encouraged to call/return or go directly to the emergency perforated worsening symptoms whatsoever.    Pain, dental Patient complained of some dental pain a few weeks ago; and started Keflex antibiotics.  However, patient did develop a generalized rash apparently from the Keflex antibiotics.  She discontinued all of the antibiotics.  Patient states that her dental pain has subsided; and she has plans to followup with a dentist soon.  Patient continues with a healing rash to her generalized body.  No evidence of infection to the rash.   Non-small cell carcinoma of lung, stage 3 Patient received cycle 2 of her carboplatin/Taxol chemotherapy regimen on 07/23/2014.  She also continues to receive radiation treatments on a daily basis.  Patient is to return on 08/06/2014 for her next labs, visit, and chemotherapy.   Rash Patient initiated Keflex Anaprox last week for complaint  of dental infection.  However, patient did develop a generalized body rash when she took the Keflex.  She discontinued all of the antibiotics; and it does appear that the rash is slowly resolving.  There is no infection  noted to the rash areas.   Patient stated understanding of all instructions; and was in agreement with this plan of care. The patient knows to call the clinic with any problems, questions or concerns.   Review/collaboration with Dr. Julien Nordmann regarding all aspects of patient's visit today.   Total time spent with patient was 40 minutes;  with greater than 75 percent of that time spent in face to face counseling regarding patient's symptoms,  and coordination of care and follow up.  Disclaimer: This note was dictated with voice recognition software. Similar sounding words can inadvertently be transcribed and may not be corrected upon review.   Drue Second, NP 08/03/2014

## 2014-08-06 ENCOUNTER — Ambulatory Visit
Admission: RE | Admit: 2014-08-06 | Discharge: 2014-08-06 | Disposition: A | Discharge status: Home / Self Care | Payer: Medicaid Other | Source: Ambulatory Visit | Attending: Radiation Oncology | Admitting: Radiation Oncology

## 2014-08-06 ENCOUNTER — Other Ambulatory Visit (HOSPITAL_BASED_OUTPATIENT_CLINIC_OR_DEPARTMENT_OTHER): Payer: Medicaid Other

## 2014-08-06 ENCOUNTER — Telehealth: Payer: Self-pay | Admitting: *Deleted

## 2014-08-06 ENCOUNTER — Ambulatory Visit: Payer: Self-pay

## 2014-08-06 ENCOUNTER — Ambulatory Visit (HOSPITAL_BASED_OUTPATIENT_CLINIC_OR_DEPARTMENT_OTHER): Payer: Medicaid Other | Admitting: Physician Assistant

## 2014-08-06 ENCOUNTER — Encounter: Payer: Self-pay | Admitting: Physician Assistant

## 2014-08-06 ENCOUNTER — Ambulatory Visit
Admission: RE | Admit: 2014-08-06 | Discharge: 2014-08-06 | Disposition: A | Discharge status: Home / Self Care | Payer: MEDICAID | Source: Ambulatory Visit | Attending: Radiation Oncology | Admitting: Radiation Oncology

## 2014-08-06 ENCOUNTER — Encounter: Payer: Self-pay | Admitting: Radiation Oncology

## 2014-08-06 ENCOUNTER — Telehealth: Payer: Self-pay | Admitting: Physician Assistant

## 2014-08-06 ENCOUNTER — Ambulatory Visit (HOSPITAL_BASED_OUTPATIENT_CLINIC_OR_DEPARTMENT_OTHER): Payer: Medicaid Other

## 2014-08-06 VITALS — BP 125/79 | HR 74 | Temp 98.7°F | Resp 19 | Ht 60.0 in | Wt 129.9 lb

## 2014-08-06 VITALS — BP 150/93 | HR 77 | Temp 97.6°F | Resp 16 | Ht 60.0 in | Wt 128.8 lb

## 2014-08-06 DIAGNOSIS — C3490 Malignant neoplasm of unspecified part of unspecified bronchus or lung: Secondary | ICD-10-CM

## 2014-08-06 DIAGNOSIS — Z5111 Encounter for antineoplastic chemotherapy: Secondary | ICD-10-CM | POA: Diagnosis not present

## 2014-08-06 DIAGNOSIS — R21 Rash and other nonspecific skin eruption: Secondary | ICD-10-CM | POA: Diagnosis not present

## 2014-08-06 DIAGNOSIS — C3411 Malignant neoplasm of upper lobe, right bronchus or lung: Secondary | ICD-10-CM

## 2014-08-06 DIAGNOSIS — C3491 Malignant neoplasm of unspecified part of right bronchus or lung: Secondary | ICD-10-CM

## 2014-08-06 LAB — CBC WITH DIFFERENTIAL/PLATELET
BASO%: 0.3 % (ref 0.0–2.0)
Basophils Absolute: 0 10*3/uL (ref 0.0–0.1)
EOS%: 0.2 % (ref 0.0–7.0)
Eosinophils Absolute: 0 10*3/uL (ref 0.0–0.5)
HCT: 39 % (ref 34.8–46.6)
HGB: 12.5 g/dL (ref 11.6–15.9)
LYMPH#: 0.7 10*3/uL — AB (ref 0.9–3.3)
LYMPH%: 11 % — ABNORMAL LOW (ref 14.0–49.7)
MCH: 28.7 pg (ref 25.1–34.0)
MCHC: 32.1 g/dL (ref 31.5–36.0)
MCV: 89.4 fL (ref 79.5–101.0)
MONO#: 1.1 10*3/uL — AB (ref 0.1–0.9)
MONO%: 16.7 % — AB (ref 0.0–14.0)
NEUT#: 4.6 10*3/uL (ref 1.5–6.5)
NEUT%: 71.8 % (ref 38.4–76.8)
Platelets: 423 10*3/uL — ABNORMAL HIGH (ref 145–400)
RBC: 4.36 10*6/uL (ref 3.70–5.45)
RDW: 25 % — AB (ref 11.2–14.5)
WBC: 6.4 10*3/uL (ref 3.9–10.3)

## 2014-08-06 LAB — TECHNOLOGIST REVIEW

## 2014-08-06 LAB — COMPREHENSIVE METABOLIC PANEL (CC13)
ALBUMIN: 3.1 g/dL — AB (ref 3.5–5.0)
ALK PHOS: 87 U/L (ref 40–150)
ALT: 38 U/L (ref 0–55)
ANION GAP: 14 meq/L — AB (ref 3–11)
AST: 15 U/L (ref 5–34)
BILIRUBIN TOTAL: 0.34 mg/dL (ref 0.20–1.20)
BUN: 19.6 mg/dL (ref 7.0–26.0)
CO2: 23 meq/L (ref 22–29)
CREATININE: 0.7 mg/dL (ref 0.6–1.1)
Calcium: 9.2 mg/dL (ref 8.4–10.4)
Chloride: 108 mEq/L (ref 98–109)
GLUCOSE: 104 mg/dL (ref 70–140)
Potassium: 3.7 mEq/L (ref 3.5–5.1)
Sodium: 145 mEq/L (ref 136–145)
TOTAL PROTEIN: 6.8 g/dL (ref 6.4–8.3)

## 2014-08-06 MED ORDER — FAMOTIDINE IN NACL 20-0.9 MG/50ML-% IV SOLN
20.0000 mg | Freq: Once | INTRAVENOUS | Status: AC
  Administered 2014-08-06: 20 mg via INTRAVENOUS

## 2014-08-06 MED ORDER — SODIUM CHLORIDE 0.9 % IV SOLN
210.0000 mg | Freq: Once | INTRAVENOUS | Status: AC
  Administered 2014-08-06: 210 mg via INTRAVENOUS
  Filled 2014-08-06: qty 21

## 2014-08-06 MED ORDER — FAMOTIDINE IN NACL 20-0.9 MG/50ML-% IV SOLN
INTRAVENOUS | Status: AC
Start: 2014-08-06 — End: 2014-08-06
  Filled 2014-08-06: qty 50

## 2014-08-06 MED ORDER — SODIUM CHLORIDE 0.9 % IV SOLN: Freq: Once | INTRAVENOUS | Status: DC

## 2014-08-06 MED ORDER — DIPHENHYDRAMINE HCL 50 MG/ML IJ SOLN
50.0000 mg | Freq: Once | INTRAMUSCULAR | Status: AC
  Administered 2014-08-06: 50 mg via INTRAVENOUS

## 2014-08-06 MED ORDER — SODIUM CHLORIDE 0.9 % IV SOLN
Freq: Once | INTRAVENOUS | Status: AC
  Administered 2014-08-06: 11:00:00 via INTRAVENOUS
  Filled 2014-08-06: qty 8

## 2014-08-06 MED ORDER — DIPHENHYDRAMINE HCL 50 MG/ML IJ SOLN
INTRAMUSCULAR | Status: AC
  Filled 2014-08-06: qty 1

## 2014-08-06 MED ORDER — PACLITAXEL CHEMO INJECTION 300 MG/50ML
45.0000 mg/m2 | Freq: Once | INTRAVENOUS | Status: AC
  Administered 2014-08-06: 72 mg via INTRAVENOUS
  Filled 2014-08-06: qty 12

## 2014-08-06 NOTE — Patient Instructions (Signed)
[facility] Discharge Instructions for Patients Receiving Chemotherapy  Today you received the following chemotherapy agents Taxol, Carbo  To help prevent nausea and vomiting after your treatment, we encourage you to take your nausea medication as directed/prescribed   If you develop nausea and vomiting that is not controlled by your nausea medication, call the clinic.   BELOW ARE SYMPTOMS THAT SHOULD BE REPORTED IMMEDIATELY:  *FEVER GREATER THAN 100.5 F  *CHILLS WITH OR WITHOUT FEVER  NAUSEA AND VOMITING THAT IS NOT CONTROLLED WITH YOUR NAUSEA MEDICATION  *UNUSUAL SHORTNESS OF BREATH  *UNUSUAL BRUISING OR BLEEDING  TENDERNESS IN MOUTH AND THROAT WITH OR WITHOUT PRESENCE OF ULCERS  *URINARY PROBLEMS  *BOWEL PROBLEMS  UNUSUAL RASH Items with * indicate a potential emergency and should be followed up as soon as possible.  Feel free to call the clinic you have any questions or concerns. The clinic phone number is [phone].  Please show the [facility] at check-in to the Emergency Department and triage nurse.

## 2014-08-06 NOTE — Progress Notes (Addendum)
OFFICE PROGRESS NOTE  DIAGNOSIS:  stage IIIa (T3, N2, M0) non-small cell lung cancer, squamous cell carcinoma presented with large right upper lobe mass with questionable mediastinal invasion diagnosed in February 2016.  CURRENT THERAPY: Concurrent chemoradiation with chemotherapy in the form of weekly carboplatin for an AUC of 2 and paclitaxel 45 mg/m given concurrent with radiation therapy. Status post 3 weeks of treatment  HPI: Barbara Frost 44 y.o. female with lung cancer.  She is currently being treated with a course of concurrent chemoradiation, status post 3 weekly cycles. She initiated chemotherapy with carboplatin and Taxol on 07/16/2014. This is given concurrently with radiation. She is here for week 4 today. Overall she's tolerating this course of treatment without difficulty. She reports an allergic skin reaction to a course of Keflex that she was taking. This has been discontinued and she was placed on a Medrol Dosepak. The rash is not as itchy as it has been and seems to slowly be resolving although it is still quite prominent on her back. She has 2 days remaining of the Medrol Dosepak.  She denies any shortness of breath or wheezing. Denies chest pain,  abdominal pain, nausea, vomiting, neuropathy. She is scheduled to receive her last fraction of radiation therapy on 08/20/2014.   HPI  Review of Systems  Constitutional: Negative for fever, chills, weight loss, malaise/fatigue and diaphoresis.  HENT: Negative for congestion, ear discharge, ear pain, hearing loss, nosebleeds, sore throat and tinnitus.   Eyes: Negative for blurred vision, double vision, photophobia, pain, discharge and redness.  Respiratory: Negative for cough, hemoptysis, sputum production, shortness of breath, wheezing and stridor.   Cardiovascular: Negative for chest pain, palpitations, orthopnea, claudication, leg swelling and PND.  Gastrointestinal: Negative for heartburn, nausea, vomiting, abdominal pain,  diarrhea, constipation, blood in stool and melena.  Genitourinary: Negative.   Musculoskeletal: Negative.   Skin: Positive for rash.       Slowly improving  Neurological: Negative for dizziness, tingling, focal weakness, seizures, weakness and headaches.  Endo/Heme/Allergies: Does not bruise/bleed easily.  Psychiatric/Behavioral: Negative for depression. The patient is not nervous/anxious and does not have insomnia.     Past Medical History  Diagnosis Date  . 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 Femur closed reduction Right     has Pulmonary mass; Lobar pneumonia: CAP; Leukocytosis; Anemia; Thrombocytosis; Tobacco abuse; Chest pain on respiration; Acute pneumonitis; Lung mass; Non-small cell carcinoma of lung, stage 3; Pain, dental; Drug-induced skin rash; Wheezing; and Rash on her problem list.    is allergic to keflex.    Medication List       This list is accurate as of: 08/06/14 11:09 AM.  Always use your most recent med list.               albuterol 108 (90 BASE) MCG/ACT inhaler  Commonly known as:  PROVENTIL HFA;VENTOLIN HFA  Inhale 1-2 puffs into the lungs every 6 (six) hours as needed for wheezing or shortness of breath.     aspirin 325 MG EC tablet  Take 325 mg by mouth every 6 (six) hours as needed for pain.     dextromethorphan 30 MG/5ML liquid  Commonly known as:  DELSYM  Take 30 mg by mouth as needed for cough.     docusate sodium 100 MG capsule  Commonly known as:  COLACE  Take 1 capsule (100 mg total) by mouth 2 (two) times daily.  emollient cream  Commonly known as:  BIAFINE  Apply 1 application topically daily.     guaiFENesin 100 MG/5ML liquid  Commonly known as:  ROBITUSSIN  Take 200 mg by mouth 3 (three) times daily as needed for cough.     ibuprofen 600 MG tablet  Commonly known as:  ADVIL,MOTRIN  Take 1 tablet (600 mg total) by mouth 4 (four) times daily. Take for 4 days  then use as needed.     iron polysaccharides 150 MG capsule  Commonly known as:  NIFEREX  Take 1 capsule (150 mg total) by mouth daily.     lidocaine 2 % solution  Commonly known as:  XYLOCAINE  Patient: Mix 1part 2% viscous lidocaine, 1part H20. Swish and/or swallow 60mL of this mixture, 29min before meals and at bedtime, up to QID     methylPREDNISolone 4 MG tablet  Commonly known as:  MEDROL DOSEPAK  follow package directions     oxyCODONE-acetaminophen 5-325 MG per tablet  Commonly known as:  PERCOCET/ROXICET  Take 1 tablet by mouth every 6 (six) hours as needed for severe pain.     pantoprazole 40 MG tablet  Commonly known as:  PROTONIX  Take 1 tablet (40 mg total) by mouth daily at 6 (six) AM.     prochlorperazine 10 MG tablet  Commonly known as:  COMPAZINE  Take 1 tablet (10 mg total) by mouth every 6 (six) hours as needed for nausea or vomiting.     sucralfate 1 G tablet  Commonly known as:  CARAFATE  Dissolve 1 tablet in 10 mL H20 and swallow up to QID PRN soreness with swallowing     vitamin C 500 MG tablet  Commonly known as:  ASCORBIC ACID  Take 500 mg by mouth daily.         PHYSICAL EXAMINATION  Oncology Vitals 08/06/2014 08/06/2014 08/02/2014 08/02/2014 08/02/2014 07/30/2014 07/30/2014  Height 152 cm 152 cm - - - - -  Weight 58.922 kg 58.423 kg 59.648 kg - (No Data) - 58.06 kg  Weight (lbs) 129 lbs 14 oz 128 lbs 13 oz 131 lbs 8 oz - (No Data) - 128 lbs  BMI (kg/m2) 25.37 kg/m2 25.15 kg/m2 - - - - -  Temp 98.7 97.6 98.8 - 98.9 98.7 98.3  Pulse 74 77 94 102 114 95 111  Resp $Rem'19 16 18 'lVmq$ - 20 - 12  SpO2 100 100 - 100 100 - 99  BSA (m2) 1.58 m2 1.57 m2 - - - - -   BP Readings from Last 3 Encounters:  08/06/14 125/79  08/06/14 150/93  08/02/14 106/69    Physical Exam  Constitutional: She is oriented to person, place, and time and well-developed, well-nourished, and in no distress.  HENT:  Head: Normocephalic and atraumatic.  Eyes: Conjunctivae and EOM are  normal. Pupils are equal, round, and reactive to light. Right eye exhibits no discharge. Left eye exhibits no discharge. No scleral icterus.  Neck: Normal range of motion.  Pulmonary/Chest: Effort normal. No respiratory distress.  Musculoskeletal: Normal range of motion.  Neurological: She is alert and oriented to person, place, and time. Gait normal.  Skin: Skin is warm and dry. Rash noted.  Erythematous macular papular eruptions most extensive on the upper back and to a lesser extent on the anterior chest with excoriation marks. No evidence of superinfection  Psychiatric: Affect normal.  Nursing note and vitals reviewed.   LABORATORY DATA:. Appointment on 08/06/2014  Component Date Value Ref Range Status  .  WBC 08/06/2014 6.4  3.9 - 10.3 10e3/uL Final  . NEUT# 08/06/2014 4.6  1.5 - 6.5 10e3/uL Final  . HGB 08/06/2014 12.5  11.6 - 15.9 g/dL Final  . HCT 08/06/2014 39.0  34.8 - 46.6 % Final  . Platelets 08/06/2014 423* 145 - 400 10e3/uL Final  . MCV 08/06/2014 89.4  79.5 - 101.0 fL Final  . MCH 08/06/2014 28.7  25.1 - 34.0 pg Final  . MCHC 08/06/2014 32.1  31.5 - 36.0 g/dL Final  . RBC 08/06/2014 4.36  3.70 - 5.45 10e6/uL Final  . RDW 08/06/2014 25.0* 11.2 - 14.5 % Final  . lymph# 08/06/2014 0.7* 0.9 - 3.3 10e3/uL Final  . MONO# 08/06/2014 1.1* 0.1 - 0.9 10e3/uL Final  . Eosinophils Absolute 08/06/2014 0.0  0.0 - 0.5 10e3/uL Final  . Basophils Absolute 08/06/2014 0.0  0.0 - 0.1 10e3/uL Final  . NEUT% 08/06/2014 71.8  38.4 - 76.8 % Final  . LYMPH% 08/06/2014 11.0* 14.0 - 49.7 % Final  . MONO% 08/06/2014 16.7* 0.0 - 14.0 % Final  . EOS% 08/06/2014 0.2  0.0 - 7.0 % Final  . BASO% 08/06/2014 0.3  0.0 - 2.0 % Final  . Sodium 08/06/2014 145  136 - 145 mEq/L Final  . Potassium 08/06/2014 3.7  3.5 - 5.1 mEq/L Final  . Chloride 08/06/2014 108  98 - 109 mEq/L Final  . CO2 08/06/2014 23  22 - 29 mEq/L Final  . Glucose 08/06/2014 104  70 - 140 mg/dl Final  . BUN 08/06/2014 19.6  7.0 - 26.0  mg/dL Final  . Creatinine 08/06/2014 0.7  0.6 - 1.1 mg/dL Final  . Total Bilirubin 08/06/2014 0.34  0.20 - 1.20 mg/dL Final  . Alkaline Phosphatase 08/06/2014 87  40 - 150 U/L Final  . AST 08/06/2014 15  5 - 34 U/L Final  . ALT 08/06/2014 38  0 - 55 U/L Final  . Total Protein 08/06/2014 6.8  6.4 - 8.3 g/dL Final  . Albumin 08/06/2014 3.1* 3.5 - 5.0 g/dL Final  . Calcium 08/06/2014 9.2  8.4 - 10.4 mg/dL Final  . Anion Gap 08/06/2014 14* 3 - 11 mEq/L Final  . EGFR 08/06/2014 >90  >90 ml/min/1.73 m2 Final   eGFR is calculated using the CKD-EPI Creatinine Equation (2009)  . Technologist Review 08/06/2014 Metas and Myelocytes present   Final     RADIOGRAPHIC STUDIES: No results found.  ASSESSMENT/PLAN:    No problem-specific assessment & plan notes found for this encounter.  This is a pleasant 44 year old female with stage IIIa (T3, N2, M0) non-small cell lung cancer, squamous cell carcinoma presented with large right upper lobe mass with questionable mediastinal invasion diagnosed in February 2016. She is currently receiving concurrent chemoradiation therapy. Her chemotherapy consisted of carboplatin for an AUC of 2 and Taxol at 45 mg/m given weekly concurrent with radiation..   The patient was discussed with and also seen by Dr. Julien Nordmann. Overall she's tolerating her course of concurrent chemoradiation relatively well. She will proceed with chemotherapy today as scheduled.   Her rash is likely due to the Keflex. The rash is slowly resolving. She was given a prescription for a Medrol Dosepak that she has almost completed. She may continue to use Benadryl to help with the itching and may also apply either hydrocortisone or Benadryl cream to the rash.  She will complete her course of concurrent chemoradiation as scheduled.  She is scheduled for her last fraction of radiation therapy on 08/20/2014. She will follow-up  in 2 weeks for reevaluation.  Patient stated understanding of all  instructions; and was in agreement with this plan of care. The patient knows to call the clinic with any problems, questions or concerns.    Carlton Adam, PA-C 08/06/2014   ADDENDUM: Hematology/Oncology Attending:  I had a face to face encounter with the patient. I recommended her care plan. This is a very pleasant 44 years old white female recently diagnosed with a stage IIIa non-small cell lung cancer, squamous cell carcinoma. She is currently undergoing a course of concurrent chemoradiation with weekly carboplatin and paclitaxel is status post 3 weekly doses and tolerating her treatment fairly well. She had some skin rash secondary to treatment with Keflex which was discontinued. The patient will proceed with her course of concurrent chemoradiation as scheduled. She would come back for follow-up visit in 2 weeks for reevaluation. The patient was advised to call immediately if she has any concerning symptoms in the interval.  Disclaimer: This note was dictated with voice recognition software. Similar sounding words can inadvertently be transcribed and may be missed upon review. Eilleen Kempf., MD 08/12/2014

## 2014-08-06 NOTE — Telephone Encounter (Signed)
Gave avs & calendar for April/May. Sent message to schedule treatment & to adjust treatment.

## 2014-08-06 NOTE — Progress Notes (Signed)
Barbara Frost has completed 20 fractions to her right lung.  Barbara Frost denies pain.  Barbara Frost does report a sore throat and is using lidocaine solution and Carafate twice a day.  Barbara Frost reports a good appetite.  Her weight is down 3 lbs from 3/31.  Barbara Frost continues to have a dry cough.  Barbara Frost denies hemoptysis.  Barbara Frost reports her breathing is better today and is using a Proventil inhaler 2-3 times a day.  Barbara Frost is requesting a refill on Niferex which Barbara Frost received in the hospital.  Barbara Frost has a rash on her chest and upper back.  Barbara Frost says it is from Keflex.  Barbara Frost is not using biafine.  Barbara Frost reports fatigue.  Barbara Frost has chemotherapy today.  BP 150/93 mmHg  Pulse 77  Temp(Src) 97.6 F (36.4 C) (Oral)  Resp 16  Ht 5' (1.524 m)  Wt 128 lb 12.8 oz (58.423 kg)  BMI 25.15 kg/m2  SpO2 100%  LMP 07/11/2014

## 2014-08-06 NOTE — Telephone Encounter (Signed)
Per staff message and POF I have scheduled appts. Advised scheduler of appts. JMW  

## 2014-08-06 NOTE — Progress Notes (Signed)
Weekly Management Note:  Site: Right lung Current Dose:  4000  cGy Projected Dose: 6000  cGy  Narrative: The patient is seen today for routine under treatment assessment. CBCT/MVCT images/port films were reviewed. The chart was reviewed.   She states that her wheezing is much improved after starting Proventil.  She is having difficulty with pollen.  She still has a dry cough.  A cone beam CT shows mild tumor regression with slight aeration of her right upper lobe.  Physical Examination:  Filed Vitals:   08/06/14 0847  BP: 150/93  Pulse: 77  Temp: 97.6 F (36.4 C)  Resp: 16  .  Weight: 128 lb 12.8 oz (58.423 kg).  There is a generalized rash along the anterior and posterior chest from Keflex.  There is mild erythema along the right anterior posterior chest with no areas of desquamation.  Laboratory data: Lab Results  Component Value Date   WBC 6.4 08/06/2014   HGB 12.5 08/06/2014   HCT 39.0 08/06/2014   MCV 89.4 08/06/2014   PLT 423* 08/06/2014     Impression: Tolerating radiation therapy well.  Plan: Continue radiation therapy as planned.

## 2014-08-07 ENCOUNTER — Ambulatory Visit
Admission: RE | Admit: 2014-08-07 | Discharge: 2014-08-07 | Disposition: A | Discharge status: Home / Self Care | Payer: Medicaid Other | Source: Ambulatory Visit | Attending: Radiation Oncology | Admitting: Radiation Oncology

## 2014-08-07 ENCOUNTER — Encounter: Payer: Self-pay | Admitting: Radiation Oncology

## 2014-08-07 DIAGNOSIS — C3491 Malignant neoplasm of unspecified part of right bronchus or lung: Secondary | ICD-10-CM

## 2014-08-07 NOTE — Progress Notes (Addendum)
CT simulation note: The patient was taken to the CT simulator to review her isodose distribution and based on improved right upper lobe lung aeration.  Her chest was scanned.  The same isocenter was chosen.  Her normal anatomy including her lungs, esophagus and spinal cord were recontoured.  We will review her dose distribution and she may require replanning.

## 2014-08-07 NOTE — Progress Notes (Signed)
Chart note/dosimetry plan summation: Because of improved aeration of her right upper lung the patient underwent CT simulation with further planning.  She was set up to her current treatment field with the new CT data set.  Dose volume histograms were obtained for the lung, spinal cord, and esophagus.  There was not a significant difference to change her current treatment.  I did review her plan summation for all 3 treatment plans, and this is placed in the document folder.

## 2014-08-07 NOTE — Addendum Note (Signed)
Encounter addended by: Arloa Koh, MD on: 08/07/2014  4:32 PM<BR>     Documentation filed: Notes Section

## 2014-08-08 ENCOUNTER — Ambulatory Visit
Admission: RE | Admit: 2014-08-08 | Discharge: 2014-08-08 | Disposition: A | Discharge status: Home / Self Care | Payer: Medicaid Other | Source: Ambulatory Visit | Attending: Radiation Oncology | Admitting: Radiation Oncology

## 2014-08-08 ENCOUNTER — Telehealth: Payer: Self-pay | Admitting: *Deleted

## 2014-08-08 DIAGNOSIS — C3491 Malignant neoplasm of unspecified part of right bronchus or lung: Secondary | ICD-10-CM | POA: Diagnosis not present

## 2014-08-08 NOTE — Telephone Encounter (Signed)
Someone from [facility] office called and left message that they would like to speak to Dr. Worthy Flank nurse.  Call back is [phone].  Forwarded message to dr. Worthy Flank nurse.

## 2014-08-08 NOTE — Telephone Encounter (Signed)
Returned call to [place] with [facility] office, unable to reach lmovm.

## 2014-08-08 NOTE — Patient Instructions (Signed)
Complete your course of concurrent chemoradiation as scheduled Follow-up in 2 weeks

## 2014-08-09 ENCOUNTER — Ambulatory Visit
Admission: RE | Admit: 2014-08-09 | Discharge: 2014-08-09 | Disposition: A | Discharge status: Home / Self Care | Payer: Medicaid Other | Source: Ambulatory Visit | Attending: Radiation Oncology | Admitting: Radiation Oncology

## 2014-08-09 DIAGNOSIS — C3491 Malignant neoplasm of unspecified part of right bronchus or lung: Secondary | ICD-10-CM | POA: Diagnosis not present

## 2014-08-10 ENCOUNTER — Ambulatory Visit
Admission: RE | Admit: 2014-08-10 | Discharge: 2014-08-10 | Disposition: A | Discharge status: Home / Self Care | Payer: Medicaid Other | Source: Ambulatory Visit | Attending: Radiation Oncology | Admitting: Radiation Oncology

## 2014-08-10 DIAGNOSIS — C3491 Malignant neoplasm of unspecified part of right bronchus or lung: Secondary | ICD-10-CM | POA: Diagnosis not present

## 2014-08-13 ENCOUNTER — Other Ambulatory Visit (HOSPITAL_BASED_OUTPATIENT_CLINIC_OR_DEPARTMENT_OTHER): Payer: Medicaid Other

## 2014-08-13 ENCOUNTER — Encounter (HOSPITAL_COMMUNITY): Payer: Self-pay | Admitting: Emergency Medicine

## 2014-08-13 ENCOUNTER — Emergency Department (HOSPITAL_COMMUNITY)
Admission: EM | Admit: 2014-08-13 | Discharge: 2014-08-14 | Disposition: A | Discharge status: Home / Self Care | Payer: Medicaid Other | Attending: Emergency Medicine | Admitting: Emergency Medicine

## 2014-08-13 ENCOUNTER — Emergency Department (HOSPITAL_COMMUNITY): Payer: Medicaid Other

## 2014-08-13 ENCOUNTER — Encounter: Payer: Self-pay | Admitting: Radiation Oncology

## 2014-08-13 ENCOUNTER — Ambulatory Visit (HOSPITAL_BASED_OUTPATIENT_CLINIC_OR_DEPARTMENT_OTHER): Payer: Medicaid Other

## 2014-08-13 ENCOUNTER — Ambulatory Visit
Admission: RE | Admit: 2014-08-13 | Discharge: 2014-08-13 | Disposition: A | Discharge status: Home / Self Care | Payer: MEDICAID | Source: Ambulatory Visit | Attending: Radiation Oncology | Admitting: Radiation Oncology

## 2014-08-13 ENCOUNTER — Ambulatory Visit
Admission: RE | Admit: 2014-08-13 | Discharge: 2014-08-13 | Disposition: A | Discharge status: Home / Self Care | Payer: Medicaid Other | Source: Ambulatory Visit | Attending: Radiation Oncology | Admitting: Radiation Oncology

## 2014-08-13 ENCOUNTER — Other Ambulatory Visit: Payer: Self-pay

## 2014-08-13 VITALS — BP 172/106 | HR 100 | Resp 16 | Wt 130.8 lb

## 2014-08-13 DIAGNOSIS — R000 Tachycardia, unspecified: Secondary | ICD-10-CM | POA: Diagnosis not present

## 2014-08-13 DIAGNOSIS — Z87891 Personal history of nicotine dependence: Secondary | ICD-10-CM | POA: Diagnosis not present

## 2014-08-13 DIAGNOSIS — Z85828 Personal history of other malignant neoplasm of skin: Secondary | ICD-10-CM | POA: Insufficient documentation

## 2014-08-13 DIAGNOSIS — C3491 Malignant neoplasm of unspecified part of right bronchus or lung: Secondary | ICD-10-CM

## 2014-08-13 DIAGNOSIS — R0789 Other chest pain: Secondary | ICD-10-CM | POA: Insufficient documentation

## 2014-08-13 DIAGNOSIS — A599 Trichomoniasis, unspecified: Secondary | ICD-10-CM | POA: Insufficient documentation

## 2014-08-13 DIAGNOSIS — C3411 Malignant neoplasm of upper lobe, right bronchus or lung: Secondary | ICD-10-CM | POA: Diagnosis not present

## 2014-08-13 DIAGNOSIS — Z7982 Long term (current) use of aspirin: Secondary | ICD-10-CM | POA: Diagnosis not present

## 2014-08-13 DIAGNOSIS — Z79899 Other long term (current) drug therapy: Secondary | ICD-10-CM | POA: Diagnosis not present

## 2014-08-13 DIAGNOSIS — Z3202 Encounter for pregnancy test, result negative: Secondary | ICD-10-CM | POA: Insufficient documentation

## 2014-08-13 DIAGNOSIS — R079 Chest pain, unspecified: Secondary | ICD-10-CM | POA: Diagnosis present

## 2014-08-13 DIAGNOSIS — Z85118 Personal history of other malignant neoplasm of bronchus and lung: Secondary | ICD-10-CM | POA: Insufficient documentation

## 2014-08-13 DIAGNOSIS — Z5111 Encounter for antineoplastic chemotherapy: Secondary | ICD-10-CM | POA: Diagnosis present

## 2014-08-13 DIAGNOSIS — R0781 Pleurodynia: Secondary | ICD-10-CM

## 2014-08-13 DIAGNOSIS — C3490 Malignant neoplasm of unspecified part of unspecified bronchus or lung: Secondary | ICD-10-CM

## 2014-08-13 LAB — CBC
HCT: 35.5 % — ABNORMAL LOW (ref 36.0–46.0)
HEMOGLOBIN: 11.6 g/dL — AB (ref 12.0–15.0)
MCH: 28.4 pg (ref 26.0–34.0)
MCHC: 32.7 g/dL (ref 30.0–36.0)
MCV: 86.8 fL (ref 78.0–100.0)
Platelets: 247 10*3/uL (ref 150–400)
RBC: 4.09 MIL/uL (ref 3.87–5.11)
RDW: 23.6 % — AB (ref 11.5–15.5)
WBC: 6.7 10*3/uL (ref 4.0–10.5)

## 2014-08-13 LAB — I-STAT TROPONIN, ED: Troponin i, poc: 0 ng/mL (ref 0.00–0.08)

## 2014-08-13 LAB — COMPREHENSIVE METABOLIC PANEL (CC13)
ALK PHOS: 75 U/L (ref 40–150)
ALT: 34 U/L (ref 0–55)
ANION GAP: 9 meq/L (ref 3–11)
AST: 23 U/L (ref 5–34)
Albumin: 2.9 g/dL — ABNORMAL LOW (ref 3.5–5.0)
BUN: 6.9 mg/dL — AB (ref 7.0–26.0)
CO2: 25 meq/L (ref 22–29)
CREATININE: 0.6 mg/dL (ref 0.6–1.1)
Calcium: 9 mg/dL (ref 8.4–10.4)
Chloride: 107 mEq/L (ref 98–109)
GLUCOSE: 88 mg/dL (ref 70–140)
Potassium: 3.7 mEq/L (ref 3.5–5.1)
Sodium: 141 mEq/L (ref 136–145)
Total Bilirubin: 0.53 mg/dL (ref 0.20–1.20)
Total Protein: 6.5 g/dL (ref 6.4–8.3)

## 2014-08-13 LAB — CBC WITH DIFFERENTIAL/PLATELET
BASO%: 0.2 % (ref 0.0–2.0)
Basophils Absolute: 0 10*3/uL (ref 0.0–0.1)
EOS%: 0.2 % (ref 0.0–7.0)
Eosinophils Absolute: 0 10*3/uL (ref 0.0–0.5)
HEMATOCRIT: 37.9 % (ref 34.8–46.6)
HGB: 12.5 g/dL (ref 11.6–15.9)
LYMPH%: 10.3 % — AB (ref 14.0–49.7)
MCH: 29 pg (ref 25.1–34.0)
MCHC: 33 g/dL (ref 31.5–36.0)
MCV: 87.9 fL (ref 79.5–101.0)
MONO#: 0.8 10*3/uL (ref 0.1–0.9)
MONO%: 17.5 % — ABNORMAL HIGH (ref 0.0–14.0)
NEUT#: 3.1 10*3/uL (ref 1.5–6.5)
NEUT%: 71.8 % (ref 38.4–76.8)
Platelets: 206 10*3/uL (ref 145–400)
RBC: 4.31 10*6/uL (ref 3.70–5.45)
RDW: 24 % — AB (ref 11.2–14.5)
WBC: 4.3 10*3/uL (ref 3.9–10.3)
lymph#: 0.4 10*3/uL — ABNORMAL LOW (ref 0.9–3.3)

## 2014-08-13 LAB — BASIC METABOLIC PANEL
Anion gap: 8 (ref 5–15)
BUN: 11 mg/dL (ref 6–23)
CALCIUM: 8.8 mg/dL (ref 8.4–10.5)
CO2: 24 mmol/L (ref 19–32)
Chloride: 105 mmol/L (ref 96–112)
Creatinine, Ser: 0.49 mg/dL — ABNORMAL LOW (ref 0.50–1.10)
GFR calc non Af Amer: >90 mL/min (ref >90)
Glucose, Bld: 130 mg/dL — ABNORMAL HIGH (ref 70–99)
POTASSIUM: 4.4 mmol/L (ref 3.5–5.1)
Sodium: 137 mmol/L (ref 135–145)

## 2014-08-13 MED ORDER — PACLITAXEL CHEMO INJECTION 300 MG/50ML
45.0000 mg/m2 | Freq: Once | INTRAVENOUS | Status: AC
  Administered 2014-08-13: 72 mg via INTRAVENOUS
  Filled 2014-08-13: qty 12

## 2014-08-13 MED ORDER — MORPHINE SULFATE 4 MG/ML IJ SOLN
4.0000 mg | Freq: Once | INTRAMUSCULAR | Status: AC
  Administered 2014-08-13: 4 mg via INTRAVENOUS
  Filled 2014-08-13: qty 1

## 2014-08-13 MED ORDER — SODIUM CHLORIDE 0.9 % IV SOLN
Freq: Once | INTRAVENOUS | Status: AC
  Administered 2014-08-13: 09:00:00 via INTRAVENOUS

## 2014-08-13 MED ORDER — DIPHENHYDRAMINE HCL 50 MG/ML IJ SOLN
INTRAMUSCULAR | Status: AC
  Filled 2014-08-13: qty 1

## 2014-08-13 MED ORDER — DIPHENHYDRAMINE HCL 50 MG/ML IJ SOLN
50.0000 mg | Freq: Once | INTRAMUSCULAR | Status: AC
  Administered 2014-08-13: 50 mg via INTRAVENOUS

## 2014-08-13 MED ORDER — FAMOTIDINE IN NACL 20-0.9 MG/50ML-% IV SOLN
INTRAVENOUS | Status: AC
  Filled 2014-08-13: qty 50

## 2014-08-13 MED ORDER — SODIUM CHLORIDE 0.9 % IV SOLN
Freq: Once | INTRAVENOUS | Status: AC
  Administered 2014-08-13: 10:00:00 via INTRAVENOUS
  Filled 2014-08-13: qty 8

## 2014-08-13 MED ORDER — FAMOTIDINE IN NACL 20-0.9 MG/50ML-% IV SOLN
20.0000 mg | Freq: Once | INTRAVENOUS | Status: AC
  Administered 2014-08-13: 20 mg via INTRAVENOUS

## 2014-08-13 MED ORDER — SODIUM CHLORIDE 0.9 % IV SOLN
210.0000 mg | Freq: Once | INTRAVENOUS | Status: AC
  Administered 2014-08-13: 210 mg via INTRAVENOUS
  Filled 2014-08-13: qty 21

## 2014-08-13 MED ORDER — ONDANSETRON HCL 4 MG/2ML IJ SOLN
4.0000 mg | Freq: Once | INTRAMUSCULAR | Status: AC
  Administered 2014-08-13: 4 mg via INTRAVENOUS
  Filled 2014-08-13: qty 2

## 2014-08-13 MED ORDER — SODIUM CHLORIDE 0.9 % IV BOLUS (SEPSIS)
1000.0000 mL | Freq: Once | INTRAVENOUS | Status: AC
  Administered 2014-08-13: 1000 mL via INTRAVENOUS

## 2014-08-13 NOTE — ED Provider Notes (Signed)
CSN: [id number]     Arrival date & time 08/13/14  2005 History   First MD Initiated Contact with Patient 08/13/14 2112     Chief Complaint  Patient presents with  . Hypertension  . Chest Pain  . Lung Cancer  . Headache     (Consider location/radiation/quality/duration/timing/severity/associated sxs/prior Treatment) HPI   44 year old female with squamous cell carcinoma of the right lung status post chemotherapy and radiation for the past several weeks who presents complaining of chest pain and headache.  Pt report pain to chest, which she described as a burning sensation to mid chest, worsening with breathing, and also report rawness in her throat.  CP is acute in onset. States she does get out of breath with walking a short distance so therefore she is currently resting to avoid ambulating. Report burning when she urinates.  Report developed a rash to her back from Keflex taken 3 weeks ago.  Sts back rash has improved.  Report coughing clear sputum.  Denies fever, chills, leg swelling, chest pressure, hemoptysis, abd pain, n/v/d.  Denies prior PE/DVT.  Denies hx of HTN but her BP is elevated today.  Does report headache. Describe headaches as a pressure sensation to forehead without neck stiffness. Pt has quit smoking.    Past Medical History  Diagnosis Date  . 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 Femur closed reduction Right    Family History  Problem Relation Age of Onset  . Heart attack Mother   . Heart attack Father    History  Substance Use Topics  . Smoking status: Former Smoker -- 1.00 packs/day for 30 years    Types: Cigarettes    Quit date: 06/26/2014  . Smokeless tobacco: Never Used  . Alcohol Use: No     Comment: socially   OB History    No data available     Review of Systems  All other systems reviewed and are negative.     Allergies  Keflex  Home Medications   Prior to Admission  medications   Medication Sig Start Date End Date Taking? Authorizing Provider  albuterol (PROVENTIL HFA;VENTOLIN HFA) 108 (90 BASE) MCG/ACT inhaler Inhale 1-2 puffs into the lungs every 6 (six) hours as needed for wheezing or shortness of breath. 08/02/14  Yes Susanne Borders, NP  aspirin 325 MG EC tablet Take 650 mg by mouth every 6 (six) hours as needed for pain (pain).    Yes Historical Provider, MD  dextromethorphan-guaiFENesin (MUCINEX DM) 30-600 MG per 12 hr tablet Take 1 tablet by mouth 2 (two) times daily.   Yes Historical Provider, MD  docusate sodium (COLACE) 100 MG capsule Take 1 capsule (100 mg total) by mouth 2 (two) times daily. 07/03/14  Yes Eugenie Filler, MD  emollient (BIAFINE) cream Apply 1 application topically daily.   Yes Arloa Koh, MD  ibuprofen (ADVIL,MOTRIN) 600 MG tablet Take 1 tablet (600 mg total) by mouth 4 (four) times daily. Take for 4 days then use as needed. 07/03/14  Yes Eugenie Filler, MD  oxyCODONE-acetaminophen (PERCOCET/ROXICET) 5-325 MG per tablet Take 1 tablet by mouth every 6 (six) hours as needed for severe pain. 07/06/14  Yes Curt Bears, MD  vitamin C (ASCORBIC ACID) 500 MG tablet Take 500 mg by mouth daily.   Yes Historical Provider, MD  iron polysaccharides (NIFEREX) 150 MG capsule Take 1 capsule (150 mg total) by mouth daily. Patient not taking: Reported  on 08/13/2014 07/03/14   Eugenie Filler, MD  lidocaine (XYLOCAINE) 2 % solution Patient: Mix 1part 2% viscous lidocaine, 1part H20. Swish and/or swallow 60mL of this mixture, 64min before meals and at bedtime, up to QID Patient not taking: Reported on 08/13/2014 07/30/14   Eppie Gibson, MD  methylPREDNISolone (MEDROL DOSEPAK) 4 MG tablet follow package directions Patient not taking: Reported on 08/13/2014 08/02/14   Susanne Borders, NP  pantoprazole (PROTONIX) 40 MG tablet Take 1 tablet (40 mg total) by mouth daily at 6 (six) AM. Patient not taking: Reported on 08/13/2014 07/03/14   Eugenie Filler,  MD  prochlorperazine (COMPAZINE) 10 MG tablet Take 1 tablet (10 mg total) by mouth every 6 (six) hours as needed for nausea or vomiting. Patient not taking: Reported on 08/13/2014 07/23/14   Maryanna Shape, NP  sucralfate (CARAFATE) 1 G tablet Dissolve 1 tablet in 10 mL H20 and swallow up to QID PRN soreness with swallowing Patient not taking: Reported on 08/13/2014 07/30/14   Eppie Gibson, MD   BP 184/113 mmHg  Pulse 109  Temp(Src) 99.1 F (37.3 C) (Oral)  Resp 16  SpO2 97%  LMP 08/05/2014 Physical Exam  Constitutional: She is oriented to person, place, and time. She appears well-developed and well-nourished. No distress.  HENT:  Head: Atraumatic.  Mouth/Throat: Oropharynx is clear and moist.  Eyes: Conjunctivae are normal.  Neck: Neck supple.  Cardiovascular: Intact distal pulses.   Mild tachycardia without murmur rubs or gallops  Pulmonary/Chest: Effort normal and breath sounds normal. No respiratory distress. She has no wheezes. She has no rales. She exhibits no tenderness.  Abdominal: Soft. Bowel sounds are normal. She exhibits no distension. There is no tenderness.  Musculoskeletal: She exhibits no edema.  Neurological: She is alert and oriented to person, place, and time.  Skin: No rash noted.  Psychiatric: She has a normal mood and affect.  Nursing note and vitals reviewed.   ED Course  Procedures (including critical care time)  Patient with history of lung cancer currently receiving both chemotherapy and radiation who presents with acute onset chest pleuritic chest pain or shortness of breath. Although her pain may be related to chemotherapy and radiation, she is at risk for PE therefore I will obtain a chest CT angio to rule out PE. She also complaining of burning urination, UA ordered. Pain medication given. Patient is not a tachycardia, IV fluid given as well.  Care discussed with Dr. Betsey Holiday.  1:21 AM UA with evidence of trichomoniasis.  Flagyl given.  No significant  abdominal or pelvic discomfort on exam but pt did report having burning on urination, therefore she will be discharged with doxycycline for treatment of UTI, and possible STD.  Urine culture sent.  1:33 AM No evidence of PE on CT scan.  Suspect pain from radiation/chemo.  Pt made aware of plan.  Pt to f/u with oncologist for further care.  Elevated BP, doubt hypertensive emergency.  Has no focal neuro deficits on exam.  I recommend having BP recheck by PCP promptly.  Care discussed with Dr. Mingo Amber as well.    Labs Review Labs Reviewed  CBC - Abnormal; Notable for the following:    Hemoglobin 11.6 (*)    HCT 35.5 (*)    RDW 23.6 (*)    All other components within normal limits  BASIC METABOLIC PANEL - Abnormal; Notable for the following:    Glucose, Bld 130 (*)    Creatinine, Ser 0.49 (*)  All other components within normal limits  Randolm Idol, ED    Imaging Review Dg Chest Port 1 View  08/13/2014   CLINICAL DATA:  Hypertension, chest pain, lung cancer. Weakness, shortness of breath and dizziness. Status post chemotherapy and radiation today.  EXAM: PORTABLE CHEST - 1 VIEW  COMPARISON:  Most recent chest imaging PET-CT 07/11/2014  FINDINGS: Right upper lobe mass measuring at least 8.5 cm. The lungs are otherwise clear. Cardiomediastinal contours are normal. No pleural effusion or pneumothorax. No acute osseous abnormalities are seen. Pulmonary vasculature is normal.  IMPRESSION: 1. Right upper lobe mass. 2. No superimposed acute process.   Electronically Signed   By: Jeb Levering M.D.   On: 08/13/2014 21:26     EKG Interpretation None      Date: 08/14/2014  Rate: 88  Rhythm: normal sinus rhythm  QRS Axis: normal  Intervals: normal  ST/T Wave abnormalities: normal  Conduction Disutrbances: none  Narrative Interpretation:   Old EKG Reviewed: No significant changes noted     MDM   Final diagnoses:  Pleuritic chest pain  Trichomoniasis    BP 152/116 mmHg  Pulse  85  Temp(Src) 99.1 F (37.3 C) (Oral)  Resp 12  SpO2 95%  LMP 08/05/2014  I have reviewed nursing notes and vital signs. I personally reviewed the imaging tests through PACS system  I reviewed available ER/hospitalization records thought the EMR     Domenic Moras, PA-C 08/14/14 [place], PA-C 08/14/14 0148  Orpah Greek, MD 08/14/14 [phone]

## 2014-08-13 NOTE — ED Notes (Signed)
Pt has hx of R lung cancer. Pt received chemo and radiation this morning. Since then, pt c/o R sided chest pain radiating into R back. Pt also c/o High blood pressure. BP currently 184/113. Pt c/o headache. Denies SOB, Dizziness, Lightheadedness. A&Ox4 and ambulatory.

## 2014-08-13 NOTE — Progress Notes (Signed)
Weekly Management Note:  Site: Right lung Current Dose:  5000  cGy Projected Dose: 6000  cGy  Narrative: The patient is seen today for routine under treatment assessment. CBCT/MVCT images/port films were reviewed. The chart was reviewed.   She states that her breathing continues to improve.  She is having more difficulty with esophagitis as expected.  Viscous Xylocaine and Carafate are marginally beneficial.  Her pain is improved with Percocet.  She receives more chemotherapy today.  She does have a productive cough with clear sputum.  She is also taking Mucinex.  Physical Examination:  Filed Vitals:   08/13/14 0837  BP: 172/106  Pulse: 100  Resp:   .  Weight: 130 lb 12.8 oz (59.33 kg).  There is moderate erythema along the anterior posterior chest with patchy dry desquamation.  Right upper lung zone now has breath sounds but are diminished.  Lab Results  Component Value Date   WBC 4.3 08/13/2014   HGB 12.5 08/13/2014   HCT 37.9 08/13/2014   MCV 87.9 08/13/2014   PLT 206 08/13/2014    Impression: Tolerating radiation therapy well.  Moderate radiation esophagitis as expected.  She will finish radiation therapy one week.  Plan: Continue radiation therapy as planned.

## 2014-08-13 NOTE — Patient Instructions (Signed)
[facility] Discharge Instructions for Patients Receiving Chemotherapy  Today you received the following chemotherapy agents Taxol, Carbo  To help prevent nausea and vomiting after your treatment, we encourage you to take your nausea medication as directed/prescribed   If you develop nausea and vomiting that is not controlled by your nausea medication, call the clinic.   BELOW ARE SYMPTOMS THAT SHOULD BE REPORTED IMMEDIATELY:  *FEVER GREATER THAN 100.5 F  *CHILLS WITH OR WITHOUT FEVER  NAUSEA AND VOMITING THAT IS NOT CONTROLLED WITH YOUR NAUSEA MEDICATION  *UNUSUAL SHORTNESS OF BREATH  *UNUSUAL BRUISING OR BLEEDING  TENDERNESS IN MOUTH AND THROAT WITH OR WITHOUT PRESENCE OF ULCERS  *URINARY PROBLEMS  *BOWEL PROBLEMS  UNUSUAL RASH Items with * indicate a potential emergency and should be followed up as soon as possible.  Feel free to call the clinic you have any questions or concerns. The clinic phone number is [phone].  Please show the [place] at check-in to the Emergency Department and triage nurse.

## 2014-08-13 NOTE — ED Notes (Signed)
Chest XR at bedside

## 2014-08-13 NOTE — Progress Notes (Signed)
Reports using proventil inhaler 2-3 times per day. Reports discomfort with swallowing continues despite using lidocaine and carafate. Reports taking one percocet tablet every six hours for throat pain. Hyperpigmentation with dry desquamation of medial chest noted. Reports using biafine as directed. Reports fatigue. Denies SOB. Reports a productive cough with clear sputum. Reports taking mucinex.

## 2014-08-14 ENCOUNTER — Ambulatory Visit
Admission: RE | Admit: 2014-08-14 | Discharge: 2014-08-14 | Disposition: A | Discharge status: Home / Self Care | Payer: Medicaid Other | Source: Ambulatory Visit | Attending: Radiation Oncology | Admitting: Radiation Oncology

## 2014-08-14 DIAGNOSIS — C3491 Malignant neoplasm of unspecified part of right bronchus or lung: Secondary | ICD-10-CM | POA: Diagnosis not present

## 2014-08-14 LAB — URINALYSIS, ROUTINE W REFLEX MICROSCOPIC
BILIRUBIN URINE: NEGATIVE
Glucose, UA: NEGATIVE mg/dL
Ketones, ur: NEGATIVE mg/dL
Leukocytes, UA: NEGATIVE
NITRITE: NEGATIVE
PH: 7.5 (ref 5.0–8.0)
Protein, ur: NEGATIVE mg/dL
Specific Gravity, Urine: 1.014 (ref 1.005–1.030)
Urobilinogen, UA: 1 mg/dL (ref 0.0–1.0)

## 2014-08-14 LAB — URINE MICROSCOPIC-ADD ON

## 2014-08-14 LAB — POC URINE PREG, ED: PREG TEST UR: NEGATIVE

## 2014-08-14 MED ORDER — MORPHINE SULFATE 4 MG/ML IJ SOLN
4.0000 mg | Freq: Once | INTRAMUSCULAR | Status: AC
Start: 2014-08-14 — End: 2014-08-14
  Administered 2014-08-14: 4 mg via INTRAVENOUS
  Filled 2014-08-14: qty 1

## 2014-08-14 MED ORDER — OXYCODONE-ACETAMINOPHEN 5-325 MG PO TABS: 1.0000 | ORAL_TABLET | Freq: Four times a day (QID) | ORAL | Status: DC | PRN

## 2014-08-14 MED ORDER — DOXYCYCLINE HYCLATE 100 MG PO CAPS: 100.0000 mg | ORAL_CAPSULE | Freq: Two times a day (BID) | ORAL | Status: DC

## 2014-08-14 MED ORDER — METRONIDAZOLE 500 MG PO TABS
2000.0000 mg | ORAL_TABLET | Freq: Once | ORAL | Status: AC
  Administered 2014-08-14: 2000 mg via ORAL
  Filled 2014-08-14: qty 4

## 2014-08-14 MED ORDER — IOHEXOL 350 MG/ML SOLN
100.0000 mL | Freq: Once | INTRAVENOUS | Status: AC | PRN
  Administered 2014-08-14: 100 mL via INTRAVENOUS

## 2014-08-14 NOTE — Discharge Instructions (Signed)
Your chest pain is likely related to chemo and radiation.  Follow up closely with your oncologist for further care.  Take antibiotic as prescribed for treatment of urinary tract infection.  Return if your condition worsen or if you have other concerns.     Trichomoniasis Trichomoniasis is an infection caused by an organism called Trichomonas. The infection can affect both women and men. In women, the outer female genitalia and the vagina are affected. In men, the penis is mainly affected, but the prostate and other reproductive organs can also be involved. Trichomoniasis is a sexually transmitted infection (STI) and is most often passed to another person through sexual contact.  RISK FACTORS  Having unprotected sexual intercourse.  Having sexual intercourse with an infected partner. SIGNS AND SYMPTOMS  Symptoms of trichomoniasis in women include:  Abnormal gray-green frothy vaginal discharge.  Itching and irritation of the vagina.  Itching and irritation of the area outside the vagina. Symptoms of trichomoniasis in men include:   Penile discharge with or without pain.  Pain during urination. This results from inflammation of the urethra. DIAGNOSIS  Trichomoniasis may be found during a Pap test or physical exam. Your health care provider may use one of the following methods to help diagnose this infection:  Examining vaginal discharge under a microscope. For men, urethral discharge would be examined.  Testing the pH of the vagina with a test tape.  Using a vaginal swab test that checks for the Trichomonas organism. A test is available that provides results within a few minutes.  Doing a culture test for the organism. This is not usually needed. TREATMENT   You may be given medicine to fight the infection. Women should inform their health care provider if they could be or are pregnant. Some medicines used to treat the infection should not be taken during pregnancy.  Your health  care provider may recommend over-the-counter medicines or creams to decrease itching or irritation.  Your sexual partner will need to be treated if infected. HOME CARE INSTRUCTIONS   Take medicines only as directed by your health care provider.  Take over-the-counter medicine for itching or irritation as directed by your health care provider.  Do not have sexual intercourse while you have the infection.  Women should not douche or wear tampons while they have the infection.  Discuss your infection with your partner. Your partner may have gotten the infection from you, or you may have gotten it from your partner.  Have your sex partner get examined and treated if necessary.  Practice safe, informed, and protected sex.  See your health care provider for other STI testing. SEEK MEDICAL CARE IF:   You still have symptoms after you finish your medicine.  You develop abdominal pain.  You have pain when you urinate.  You have bleeding after sexual intercourse.  You develop a rash.  Your medicine makes you sick or makes you throw up (vomit). MAKE SURE YOU:  Understand these instructions.  Will watch your condition.  Will get help right away if you are not doing well or get worse. Document Released: 10/14/2000 Document Revised: 09/04/2013 Document Reviewed: 01/30/2013 [facility] Patient Information 2015 [place], [place]. This information is not intended to replace advice given to you by your health care provider. Make sure you discuss any questions you have with your health care provider.

## 2014-08-14 NOTE — ED Notes (Signed)
PT verbalized understanding of discharge instructions including follow up and reasons to return to the ED

## 2014-08-15 ENCOUNTER — Ambulatory Visit
Admission: RE | Admit: 2014-08-15 | Discharge: 2014-08-15 | Disposition: A | Discharge status: Home / Self Care | Payer: Medicaid Other | Source: Ambulatory Visit | Attending: Radiation Oncology | Admitting: Radiation Oncology

## 2014-08-15 DIAGNOSIS — C3491 Malignant neoplasm of unspecified part of right bronchus or lung: Secondary | ICD-10-CM | POA: Diagnosis not present

## 2014-08-15 LAB — URINE CULTURE: Colony Count: 6000

## 2014-08-16 ENCOUNTER — Ambulatory Visit
Admission: RE | Admit: 2014-08-16 | Discharge: 2014-08-16 | Disposition: A | Discharge status: Home / Self Care | Payer: Medicaid Other | Source: Ambulatory Visit | Attending: Radiation Oncology | Admitting: Radiation Oncology

## 2014-08-16 DIAGNOSIS — C3491 Malignant neoplasm of unspecified part of right bronchus or lung: Secondary | ICD-10-CM | POA: Diagnosis not present

## 2014-08-17 ENCOUNTER — Ambulatory Visit
Admission: RE | Admit: 2014-08-17 | Discharge: 2014-08-17 | Disposition: A | Discharge status: Home / Self Care | Payer: Medicaid Other | Source: Ambulatory Visit | Attending: Radiation Oncology | Admitting: Radiation Oncology

## 2014-08-17 DIAGNOSIS — C3491 Malignant neoplasm of unspecified part of right bronchus or lung: Secondary | ICD-10-CM | POA: Diagnosis not present

## 2014-08-20 ENCOUNTER — Ambulatory Visit
Admission: RE | Admit: 2014-08-20 | Discharge: 2014-08-20 | Disposition: A | Discharge status: Home / Self Care | Payer: Medicaid Other | Source: Ambulatory Visit | Attending: Radiation Oncology | Admitting: Radiation Oncology

## 2014-08-20 ENCOUNTER — Encounter: Payer: Self-pay | Admitting: Oncology

## 2014-08-20 ENCOUNTER — Telehealth: Payer: Self-pay | Admitting: *Deleted

## 2014-08-20 ENCOUNTER — Ambulatory Visit (HOSPITAL_BASED_OUTPATIENT_CLINIC_OR_DEPARTMENT_OTHER): Payer: Medicaid Other | Admitting: Oncology

## 2014-08-20 ENCOUNTER — Encounter: Payer: Self-pay | Admitting: Radiation Oncology

## 2014-08-20 ENCOUNTER — Ambulatory Visit (HOSPITAL_BASED_OUTPATIENT_CLINIC_OR_DEPARTMENT_OTHER): Payer: Medicaid Other

## 2014-08-20 ENCOUNTER — Other Ambulatory Visit (HOSPITAL_BASED_OUTPATIENT_CLINIC_OR_DEPARTMENT_OTHER): Payer: Medicaid Other

## 2014-08-20 ENCOUNTER — Ambulatory Visit
Admission: RE | Admit: 2014-08-20 | Discharge: 2014-08-20 | Disposition: A | Discharge status: Home / Self Care | Payer: MEDICAID | Source: Ambulatory Visit | Attending: Radiation Oncology | Admitting: Radiation Oncology

## 2014-08-20 ENCOUNTER — Telehealth: Payer: Self-pay | Admitting: Oncology

## 2014-08-20 VITALS — BP 135/85 | HR 79

## 2014-08-20 VITALS — BP 129/95 | HR 80 | Temp 98.1°F | Resp 18 | Ht 60.0 in | Wt 126.0 lb

## 2014-08-20 VITALS — BP 138/87 | HR 82 | Temp 98.6°F | Resp 12 | Wt 125.8 lb

## 2014-08-20 DIAGNOSIS — C3411 Malignant neoplasm of upper lobe, right bronchus or lung: Secondary | ICD-10-CM

## 2014-08-20 DIAGNOSIS — C3490 Malignant neoplasm of unspecified part of unspecified bronchus or lung: Secondary | ICD-10-CM

## 2014-08-20 DIAGNOSIS — Z5111 Encounter for antineoplastic chemotherapy: Secondary | ICD-10-CM | POA: Diagnosis present

## 2014-08-20 DIAGNOSIS — C3491 Malignant neoplasm of unspecified part of right bronchus or lung: Secondary | ICD-10-CM

## 2014-08-20 LAB — COMPREHENSIVE METABOLIC PANEL (CC13)
ALT: 56 U/L — AB (ref 0–55)
AST: 40 U/L — ABNORMAL HIGH (ref 5–34)
Albumin: 3.3 g/dL — ABNORMAL LOW (ref 3.5–5.0)
Alkaline Phosphatase: 74 U/L (ref 40–150)
Anion Gap: 12 mEq/L — ABNORMAL HIGH (ref 3–11)
BILIRUBIN TOTAL: 0.8 mg/dL (ref 0.20–1.20)
BUN: 13.7 mg/dL (ref 7.0–26.0)
CO2: 24 mEq/L (ref 22–29)
Calcium: 9.3 mg/dL (ref 8.4–10.4)
Chloride: 107 mEq/L (ref 98–109)
Creatinine: 0.7 mg/dL (ref 0.6–1.1)
EGFR: >90 mL/min/{1.73_m2} (ref >90)
GLUCOSE: 85 mg/dL (ref 70–140)
Potassium: 3.7 mEq/L (ref 3.5–5.1)
SODIUM: 142 meq/L (ref 136–145)
TOTAL PROTEIN: 6.9 g/dL (ref 6.4–8.3)

## 2014-08-20 LAB — CBC WITH DIFFERENTIAL/PLATELET
BASO%: 0.4 % (ref 0.0–2.0)
BASOS ABS: 0 10*3/uL (ref 0.0–0.1)
EOS ABS: 0 10*3/uL (ref 0.0–0.5)
EOS%: 0.6 % (ref 0.0–7.0)
HCT: 37.6 % (ref 34.8–46.6)
HEMOGLOBIN: 12 g/dL (ref 11.6–15.9)
LYMPH%: 9 % — AB (ref 14.0–49.7)
MCH: 27.8 pg (ref 25.1–34.0)
MCHC: 31.8 g/dL (ref 31.5–36.0)
MCV: 87.4 fL (ref 79.5–101.0)
MONO#: 0.4 10*3/uL (ref 0.1–0.9)
MONO%: 9.7 % (ref 0.0–14.0)
NEUT%: 80.3 % — AB (ref 38.4–76.8)
NEUTROS ABS: 3.7 10*3/uL (ref 1.5–6.5)
PLATELETS: 183 10*3/uL (ref 145–400)
RBC: 4.3 10*6/uL (ref 3.70–5.45)
RDW: 27.2 % — ABNORMAL HIGH (ref 11.2–14.5)
WBC: 4.6 10*3/uL (ref 3.9–10.3)
lymph#: 0.4 10*3/uL — ABNORMAL LOW (ref 0.9–3.3)

## 2014-08-20 MED ORDER — SODIUM CHLORIDE 0.9 % IV SOLN
250.0000 mL | Freq: Once | INTRAVENOUS | Status: AC
  Administered 2014-08-20: 250 mL via INTRAVENOUS

## 2014-08-20 MED ORDER — DIPHENHYDRAMINE HCL 50 MG/ML IJ SOLN
INTRAMUSCULAR | Status: AC
  Filled 2014-08-20: qty 1

## 2014-08-20 MED ORDER — FAMOTIDINE IN NACL 20-0.9 MG/50ML-% IV SOLN
20.0000 mg | Freq: Once | INTRAVENOUS | Status: AC
  Administered 2014-08-20: 20 mg via INTRAVENOUS

## 2014-08-20 MED ORDER — PACLITAXEL CHEMO INJECTION 300 MG/50ML
45.0000 mg/m2 | Freq: Once | INTRAVENOUS | Status: AC
  Administered 2014-08-20: 72 mg via INTRAVENOUS
  Filled 2014-08-20: qty 12

## 2014-08-20 MED ORDER — DIPHENHYDRAMINE HCL 50 MG/ML IJ SOLN
50.0000 mg | Freq: Once | INTRAMUSCULAR | Status: AC
  Administered 2014-08-20: 50 mg via INTRAVENOUS

## 2014-08-20 MED ORDER — SODIUM CHLORIDE 0.9 % IV SOLN
210.0000 mg | Freq: Once | INTRAVENOUS | Status: AC
  Administered 2014-08-20: 210 mg via INTRAVENOUS
  Filled 2014-08-20: qty 21

## 2014-08-20 MED ORDER — SODIUM CHLORIDE 0.9 % IV SOLN
Freq: Once | INTRAVENOUS | Status: AC
  Administered 2014-08-20: 12:00:00 via INTRAVENOUS
  Filled 2014-08-20: qty 8

## 2014-08-20 MED ORDER — FAMOTIDINE IN NACL 20-0.9 MG/50ML-% IV SOLN
INTRAVENOUS | Status: AC
  Filled 2014-08-20: qty 50

## 2014-08-20 NOTE — Telephone Encounter (Signed)
-----   Message from Jarvis Morgan, RN sent at 08/14/2014 12:17 PM EDT ----- Regarding: Needing more info Hi Diane or Enriqueta Shutter from [facility] office called wantig to find out - whether pt's tumor is operable or non operable.  The office received records but need to find out about the tumor issue so they can determine about pt's medicaid approval. I read the notes but could not find an answer for her. Please call Anderson Malta at  [phone]  Ext   [phone]. Thanks, Thu.

## 2014-08-20 NOTE — Progress Notes (Signed)
[facility] End of Treatment Note  Name:Barbara Frost  Date: 08/20/2014 HYW:[id number] DOB:12/18/1970   Status:outpatient    CC: [name], MD  Dr. Curt Bears  REFERRING PHYSICIAN:  Dr. 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  She was then replanned with again a 5 field technique with addition of 15 MV photons.  She was also replanned after expansion of her right upper lung.               NARRATIVE:  [name] tolerated her treatment reasonably well although she had moderate radiation esophagitis during her last one to 2 weeks of radiation therapy.  This was managed with Magic mouthwash and oxycodone when necessary.  She had improved aeration of her right upper lung zone mid way through her treatment and underwent replanning.  She had dry desquamation along her right posterior chest for which she was given Biafine cream to use when necessary.  She received concomitant chemotherapy under direction of Dr. Julien Nordmann and this will be completed next week.  Her blood counts remain satisfactory.                          PLAN: Routine followup in one month. Patient instructed to call if questions or worsening complaints in interim.

## 2014-08-20 NOTE — Telephone Encounter (Signed)
Call placed to [facility] regarding pt. Spoke to [place] who advised someone has already called and taken care of this issue.  Edwin Dada denied needing any assistance.

## 2014-08-20 NOTE — Telephone Encounter (Signed)
Gave avs & calendar for May. °

## 2014-08-20 NOTE — Progress Notes (Signed)
Weekly Management Note:  Site: Right lung Current Dose:  6000  cGy Projected Dose: 6000  cGy  Narrative: The patient is seen today for routine under treatment assessment. CBCT/MVCT images/port films were reviewed. The chart was reviewed.   Radiation therapy is completed today.  She finishes chemotherapy in one week.  She has more chemotherapy today.  Her major complaint is that of pain on swallowing.  She uses Magic mouthwash.  Her weight is down 5 pounds over the past week.  Her appetite is good.  Breathing remains slightly improved.  She does have desquamation of her scanning use is cortisone cream and Biafine cream.  Physical Examination:  Filed Vitals:   08/20/14 0901  BP: 138/87  Pulse: 82  Temp: 98.6 F (37 C)  Resp: 12  .  Weight: 125 lb 12.8 oz (57.063 kg).  There is dry desquamation along the posterior right chest with excoriated skin from scratching.  Diminished breath sounds within the right upper lung zone, otherwise clear.  Laboratory data: Lab Results  Component Value Date   WBC 4.6 08/20/2014   HGB 12.0 08/20/2014   HCT 37.6 08/20/2014   MCV 87.4 08/20/2014   PLT 183 08/20/2014     Impression: Tolerating radiation therapy well, except for radiation esophagitis.  This should begin to improve the near future.  I encouraged her to increase her caloric intake because of her 5 pound weight loss over the past week.  Plan: She'll complete her chemotherapy next week and return to see me for a one-month follow-up visit.

## 2014-08-20 NOTE — Patient Instructions (Signed)
[facility] Discharge Instructions for Patients Receiving Chemotherapy  Today you received the following chemotherapy agents Taxol/Carboplatin To help prevent nausea and vomiting after your treatment, we encourage you to take your nausea medication as prescribed.   If you develop nausea and vomiting that is not controlled by your nausea medication, call the clinic.   BELOW ARE SYMPTOMS THAT SHOULD BE REPORTED IMMEDIATELY:  *FEVER GREATER THAN 100.5 F  *CHILLS WITH OR WITHOUT FEVER  NAUSEA AND VOMITING THAT IS NOT CONTROLLED WITH YOUR NAUSEA MEDICATION  *UNUSUAL SHORTNESS OF BREATH  *UNUSUAL BRUISING OR BLEEDING  TENDERNESS IN MOUTH AND THROAT WITH OR WITHOUT PRESENCE OF ULCERS  *URINARY PROBLEMS  *BOWEL PROBLEMS  UNUSUAL RASH Items with * indicate a potential emergency and should be followed up as soon as possible.  Feel free to call the clinic you have any questions or concerns. The clinic phone number is [phone].  Please show the CHEMO ALERT CARD at check-in to the Emergency Department and triage nurse.   

## 2014-08-20 NOTE — Progress Notes (Signed)
OFFICE PROGRESS NOTE  DIAGNOSIS:  stage IIIa (T3, N2, M0) non-small cell lung cancer, squamous cell carcinoma presented with large right upper lobe mass with questionable mediastinal invasion diagnosed in February 2016.  CURRENT THERAPY: Concurrent chemoradiation with chemotherapy in the form of weekly carboplatin for an AUC of 2 and paclitaxel 45 mg/m given concurrent with radiation therapy. Status post 3 weeks of treatment  HPI: Barbara Frost 44 y.o. female with lung cancer.  She is currently being treated with a course of concurrent chemoradiation, status post 5 weekly cycles. She initiated chemotherapy with carboplatin and Taxol on 07/16/2014. This is given concurrently with radiation. She is here for week 6 today. Completed XRT this am. Overall she's tolerating this course of treatment without difficulty. She reports an allergic skin reaction to a course of Keflex that she was taking. This has been discontinued and she was placed on a Medrol Dosepak. Rash is resolving. Uses Hydrocortisone cream as needed for itching. She denies any shortness of breath or wheezing. Denies chest pain,  abdominal pain, nausea, vomiting, neuropathy.    HPI  Review of Systems  Constitutional: Negative for fever, chills and diaphoresis.  HENT: Negative for congestion and sore throat.   Respiratory: Negative for cough.   Cardiovascular: Negative for chest pain.  Gastrointestinal: Negative for nausea, vomiting and abdominal pain.  Skin: Positive for rash.  Neurological: Negative for weakness and headaches.    Past Medical History  Diagnosis Date  . 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 Femur closed reduction Right     has Pulmonary mass; Lobar pneumonia: CAP; Leukocytosis; Anemia; Thrombocytosis; Tobacco abuse; Chest pain on respiration; Acute pneumonitis; Lung mass; Non-small cell carcinoma of lung, stage 3; Pain, dental; Drug-induced  skin rash; Wheezing; and Rash on her problem list.    is allergic to keflex.    Medication List       This list is accurate as of: 08/20/14  2:45 PM.  Always use your most recent med list.               albuterol 108 (90 BASE) MCG/ACT inhaler  Commonly known as:  PROVENTIL HFA;VENTOLIN HFA  Inhale 1-2 puffs into the lungs every 6 (six) hours as needed for wheezing or shortness of breath.     aspirin 325 MG EC tablet  Take 650 mg by mouth every 6 (six) hours as needed for pain (pain).     dextromethorphan-guaiFENesin 30-600 MG per 12 hr tablet  Commonly known as:  MUCINEX DM  Take 1 tablet by mouth 2 (two) times daily.     docusate sodium 100 MG capsule  Commonly known as:  COLACE  Take 1 capsule (100 mg total) by mouth 2 (two) times daily.     doxycycline 100 MG capsule  Commonly known as:  VIBRAMYCIN  Take 1 capsule (100 mg total) by mouth 2 (two) times daily.     emollient cream  Commonly known as:  BIAFINE  Apply 1 application topically daily.     ibuprofen 600 MG tablet  Commonly known as:  ADVIL,MOTRIN  Take 1 tablet (600 mg total) by mouth 4 (four) times daily. Take for 4 days then use as needed.     iron polysaccharides 150 MG capsule  Commonly known as:  NIFEREX  Take 1 capsule (150 mg total) by mouth daily.     lidocaine 2 % solution  Commonly known as:  XYLOCAINE  Patient: Mix 1part 2% viscous lidocaine, 1part H20. Swish and/or swallow 58mL of this mixture, 18min before meals and at bedtime, up to QID     methylPREDNISolone 4 MG tablet  Commonly known as:  MEDROL DOSEPAK  follow package directions     metoprolol succinate 25 MG 24 hr tablet  Commonly known as:  TOPROL-XL  Take 12.5 mg by mouth 2 (two) times daily.     oxyCODONE-acetaminophen 5-325 MG per tablet  Commonly known as:  PERCOCET/ROXICET  Take 1 tablet by mouth every 6 (six) hours as needed for severe pain.     pantoprazole 40 MG tablet  Commonly known as:  PROTONIX  Take 1 tablet (40  mg total) by mouth daily at 6 (six) AM.     prochlorperazine 10 MG tablet  Commonly known as:  COMPAZINE  Take 1 tablet (10 mg total) by mouth every 6 (six) hours as needed for nausea or vomiting.     sucralfate 1 G tablet  Commonly known as:  CARAFATE  Dissolve 1 tablet in 10 mL H20 and swallow up to QID PRN soreness with swallowing     vitamin C 500 MG tablet  Commonly known as:  ASCORBIC ACID  Take 500 mg by mouth daily.         PHYSICAL EXAMINATION  Oncology Vitals 08/20/2014 08/20/2014 08/20/2014 08/14/2014 08/14/2014 08/14/2014 08/14/2014  Height - 152 cm - - - - -  Weight - 57.153 kg 57.063 kg - - - -  Weight (lbs) - 126 lbs 125 lbs 13 oz - - - -  BMI (kg/m2) - 24.61 kg/m2 - - - - -  Temp - 98.1 98.6 - 97.8 - -  Pulse 79 80 82 89 - 91 85  Resp - $Remo'18 12 15 'hmKQt$ - 18 12  SpO2 - 100 100 97 - 98 95  BSA (m2) - 1.56 m2 - - - - -   BP Readings from Last 3 Encounters:  08/20/14 135/85  08/20/14 129/95  08/20/14 138/87    Physical Exam  Constitutional: She is oriented to person, place, and time and well-developed, well-nourished, and in no distress.  HENT:  Head: Normocephalic and atraumatic.  Eyes: Conjunctivae and EOM are normal. Pupils are equal, round, and reactive to light. Right eye exhibits no discharge. Left eye exhibits no discharge. No scleral icterus.  Neck: Normal range of motion.  Pulmonary/Chest: Effort normal. No respiratory distress.  Musculoskeletal: Normal range of motion.  Neurological: She is alert and oriented to person, place, and time. Gait normal.  Skin: Skin is warm and dry. Rash noted.  Erythematous macular papular eruptions most extensive on the upper back and to a lesser extent on the anterior chest with excoriation marks. No evidence of superinfection  Psychiatric: Affect normal.  Nursing note and vitals reviewed.   LABORATORY DATA:. Appointment on 08/20/2014  Component Date Value Ref Range Status  . WBC 08/20/2014 4.6  3.9 - 10.3 10e3/uL Final  .  NEUT# 08/20/2014 3.7  1.5 - 6.5 10e3/uL Final  . HGB 08/20/2014 12.0  11.6 - 15.9 g/dL Final  . HCT 08/20/2014 37.6  34.8 - 46.6 % Final  . Platelets 08/20/2014 183  145 - 400 10e3/uL Final  . MCV 08/20/2014 87.4  79.5 - 101.0 fL Final  . MCH 08/20/2014 27.8  25.1 - 34.0 pg Final  . MCHC 08/20/2014 31.8  31.5 - 36.0 g/dL Final  . RBC 08/20/2014 4.30  3.70 - 5.45 10e6/uL Final  . RDW 08/20/2014  27.2* 11.2 - 14.5 % Final  . lymph# 08/20/2014 0.4* 0.9 - 3.3 10e3/uL Final  . MONO# 08/20/2014 0.4  0.1 - 0.9 10e3/uL Final  . Eosinophils Absolute 08/20/2014 0.0  0.0 - 0.5 10e3/uL Final  . Basophils Absolute 08/20/2014 0.0  0.0 - 0.1 10e3/uL Final  . NEUT% 08/20/2014 80.3* 38.4 - 76.8 % Final  . LYMPH% 08/20/2014 9.0* 14.0 - 49.7 % Final  . MONO% 08/20/2014 9.7  0.0 - 14.0 % Final  . EOS% 08/20/2014 0.6  0.0 - 7.0 % Final  . BASO% 08/20/2014 0.4  0.0 - 2.0 % Final  . Sodium 08/20/2014 142  136 - 145 mEq/L Final  . Potassium 08/20/2014 3.7  3.5 - 5.1 mEq/L Final  . Chloride 08/20/2014 107  98 - 109 mEq/L Final  . CO2 08/20/2014 24  22 - 29 mEq/L Final  . Glucose 08/20/2014 85  70 - 140 mg/dl Final  . BUN 08/20/2014 13.7  7.0 - 26.0 mg/dL Final  . Creatinine 08/20/2014 0.7  0.6 - 1.1 mg/dL Final  . Total Bilirubin 08/20/2014 0.80  0.20 - 1.20 mg/dL Final  . Alkaline Phosphatase 08/20/2014 74  40 - 150 U/L Final  . AST 08/20/2014 40* 5 - 34 U/L Final  . ALT 08/20/2014 56* 0 - 55 U/L Final  . Total Protein 08/20/2014 6.9  6.4 - 8.3 g/dL Final  . Albumin 08/20/2014 3.3* 3.5 - 5.0 g/dL Final  . Calcium 08/20/2014 9.3  8.4 - 10.4 mg/dL Final  . Anion Gap 08/20/2014 12* 3 - 11 mEq/L Final  . EGFR 08/20/2014 >90  >90 ml/min/1.73 m2 Final   eGFR is calculated using the CKD-EPI Creatinine Equation (2009)     RADIOGRAPHIC STUDIES: No results found.  ASSESSMENT/PLAN:    No problem-specific assessment & plan notes found for this encounter.  This is a pleasant 44 year old female with stage IIIa  (T3, N2, M0) non-small cell lung cancer, squamous cell carcinoma presented with large right upper lobe mass with questionable mediastinal invasion diagnosed in February 2016. She is currently receiving concurrent chemoradiation therapy. Her chemotherapy consisted of carboplatin for an AUC of 2 and Taxol at 45 mg/m given weekly concurrent with radiation..   The patient was discussed with and also seen by Dr. Julien Nordmann. Overall she's tolerating her course of concurrent chemoradiation relatively well. She will proceed with chemotherapy today as scheduled.   She will complete her course of concurrent chemoradiation as scheduled today. CT of the chest has been requested for 5-6 weeks. She will return for a visit in about 6 weeks to review these results.    Patient stated understanding of all instructions; and was in agreement with this plan of care. The patient knows to call the clinic with any problems, questions or concerns.    Mikey Bussing, NP 08/20/2014    ADDENDUM: Hematology/Oncology Attending: I had a face to face encounter with the patient. I recommended her care plan. This is a very pleasant 44 years old white female with a stage IIIa non-small cell lung cancer, squamous cell carcinoma who is currently undergoing a course of concurrent chemoradiation with weekly carboplatin and paclitaxel. The patient will finish her treatment today. I recommended for her to come back for follow-up visit in around 6 weeks after repeating CT scan of the chest for restaging of her disease. She was advised to call immediately if she has any concerning symptoms in the interval.  Disclaimer: This note was dictated with voice recognition software. Similar sounding words  can inadvertently be transcribed and may not be corrected upon review. Eilleen Kempf., MD 08/20/2014

## 2014-08-20 NOTE — Progress Notes (Signed)
She rates her pain as a 7 on a scale of 0-10. constant, dull and lump over throat. Pt complains of fatigue. Pt reports a dry cough, which is occasionally productive for clear phlem. Pt is on room air. Noted hyperpigmentation, erythema, scabbing and dry peel to multiple areas on upper right back.  Mild erythema to right anterior chest. Pt reports apply Cortisone cream and Biafine to skin as needed. Pt has had dysphagia for both solids and liquids. The patient eats a regular, healthy diet.-soft.  BP 138/87 mmHg  Pulse 82  Temp(Src) 98.6 F (37 C) (Oral)  Resp 12  Wt 125 lb 12.8 oz (57.063 kg)  SpO2 100%  LMP 08/05/2014 FINAL TREATMENT TODAY.

## 2014-08-27 ENCOUNTER — Other Ambulatory Visit: Payer: Self-pay

## 2014-08-27 ENCOUNTER — Ambulatory Visit: Payer: Self-pay

## 2014-09-01 ENCOUNTER — Encounter (HOSPITAL_COMMUNITY): Payer: Self-pay

## 2014-09-01 ENCOUNTER — Emergency Department (HOSPITAL_COMMUNITY): Payer: Medicaid Other

## 2014-09-01 ENCOUNTER — Inpatient Hospital Stay (HOSPITAL_COMMUNITY)
Admission: EM | Admit: 2014-09-01 | Discharge: 2014-09-06 | DRG: 871 | Disposition: A | Discharge status: Home / Self Care | Payer: Medicaid Other | Attending: Internal Medicine | Admitting: Internal Medicine

## 2014-09-01 DIAGNOSIS — J189 Pneumonia, unspecified organism: Secondary | ICD-10-CM | POA: Diagnosis present

## 2014-09-01 DIAGNOSIS — D638 Anemia in other chronic diseases classified elsewhere: Secondary | ICD-10-CM | POA: Diagnosis present

## 2014-09-01 DIAGNOSIS — R079 Chest pain, unspecified: Secondary | ICD-10-CM

## 2014-09-01 DIAGNOSIS — K1231 Oral mucositis (ulcerative) due to antineoplastic therapy: Secondary | ICD-10-CM | POA: Diagnosis present

## 2014-09-01 DIAGNOSIS — Z7982 Long term (current) use of aspirin: Secondary | ICD-10-CM

## 2014-09-01 DIAGNOSIS — Y842 Radiological procedure and radiotherapy as the cause of abnormal reaction of the patient, or of later complication, without mention of misadventure at the time of the procedure: Secondary | ICD-10-CM | POA: Diagnosis present

## 2014-09-01 DIAGNOSIS — C3491 Malignant neoplasm of unspecified part of right bronchus or lung: Secondary | ICD-10-CM | POA: Diagnosis present

## 2014-09-01 DIAGNOSIS — D649 Anemia, unspecified: Secondary | ICD-10-CM | POA: Diagnosis present

## 2014-09-01 DIAGNOSIS — J181 Lobar pneumonia, unspecified organism: Secondary | ICD-10-CM | POA: Diagnosis present

## 2014-09-01 DIAGNOSIS — C3490 Malignant neoplasm of unspecified part of unspecified bronchus or lung: Secondary | ICD-10-CM | POA: Diagnosis present

## 2014-09-01 DIAGNOSIS — A419 Sepsis, unspecified organism: Principal | ICD-10-CM | POA: Diagnosis present

## 2014-09-01 DIAGNOSIS — J9601 Acute respiratory failure with hypoxia: Secondary | ICD-10-CM | POA: Diagnosis present

## 2014-09-01 DIAGNOSIS — D62 Acute posthemorrhagic anemia: Secondary | ICD-10-CM | POA: Diagnosis present

## 2014-09-01 DIAGNOSIS — E876 Hypokalemia: Secondary | ICD-10-CM | POA: Diagnosis present

## 2014-09-01 DIAGNOSIS — Z87891 Personal history of nicotine dependence: Secondary | ICD-10-CM

## 2014-09-01 DIAGNOSIS — R1310 Dysphagia, unspecified: Secondary | ICD-10-CM | POA: Diagnosis present

## 2014-09-01 DIAGNOSIS — B370 Candidal stomatitis: Secondary | ICD-10-CM | POA: Diagnosis present

## 2014-09-01 DIAGNOSIS — T451X5A Adverse effect of antineoplastic and immunosuppressive drugs, initial encounter: Secondary | ICD-10-CM | POA: Diagnosis present

## 2014-09-01 DIAGNOSIS — D6481 Anemia due to antineoplastic chemotherapy: Secondary | ICD-10-CM | POA: Diagnosis present

## 2014-09-01 DIAGNOSIS — K1233 Oral mucositis (ulcerative) due to radiation: Secondary | ICD-10-CM | POA: Diagnosis present

## 2014-09-01 DIAGNOSIS — Z881 Allergy status to other antibiotic agents status: Secondary | ICD-10-CM

## 2014-09-01 LAB — I-STAT CG4 LACTIC ACID, ED: Lactic Acid, Venous: 1.97 mmol/L (ref 0.5–2.0)

## 2014-09-01 LAB — CBC WITH DIFFERENTIAL/PLATELET
BASOS PCT: 0 % (ref 0–1)
Basophils Absolute: 0 10*3/uL (ref 0.0–0.1)
Eosinophils Absolute: 0 10*3/uL (ref 0.0–0.7)
Eosinophils Relative: 0 % (ref 0–5)
HEMATOCRIT: 9.1 % — AB (ref 36.0–46.0)
Hemoglobin: 3 g/dL — CL (ref 12.0–15.0)
LYMPHS PCT: 12 % (ref 12–46)
Lymphs Abs: 1.1 10*3/uL (ref 0.7–4.0)
MCH: 28.8 pg (ref 26.0–34.0)
MCHC: 33 g/dL (ref 30.0–36.0)
MCV: 87.5 fL (ref 78.0–100.0)
MONOS PCT: 14 % — AB (ref 3–12)
Monocytes Absolute: 1.3 10*3/uL — ABNORMAL HIGH (ref 0.1–1.0)
NEUTROS ABS: 7 10*3/uL (ref 1.7–7.7)
Neutrophils Relative %: 74 % (ref 43–77)
Platelets: 376 10*3/uL (ref 150–400)
RBC: 1.04 MIL/uL — AB (ref 3.87–5.11)
RDW: 23.6 % — ABNORMAL HIGH (ref 11.5–15.5)
WBC: 9.4 10*3/uL (ref 4.0–10.5)

## 2014-09-01 LAB — COMPREHENSIVE METABOLIC PANEL
ALT: 15 U/L (ref 0–35)
AST: 17 U/L (ref 0–37)
Albumin: 2.6 g/dL — ABNORMAL LOW (ref 3.5–5.2)
Alkaline Phosphatase: 72 U/L (ref 39–117)
Anion gap: 7 (ref 5–15)
BILIRUBIN TOTAL: 0.5 mg/dL (ref 0.3–1.2)
BUN: 11 mg/dL (ref 6–23)
CHLORIDE: 108 mmol/L (ref 96–112)
CO2: 26 mmol/L (ref 19–32)
Calcium: 8.3 mg/dL — ABNORMAL LOW (ref 8.4–10.5)
Creatinine, Ser: 0.56 mg/dL (ref 0.50–1.10)
GFR calc Af Amer: >90 mL/min (ref >90)
GFR calc non Af Amer: >90 mL/min (ref >90)
Glucose, Bld: 117 mg/dL — ABNORMAL HIGH (ref 70–99)
Potassium: 3 mmol/L — ABNORMAL LOW (ref 3.5–5.1)
Sodium: 141 mmol/L (ref 135–145)
Total Protein: 6.2 g/dL (ref 6.0–8.3)

## 2014-09-01 LAB — URINE MICROSCOPIC-ADD ON

## 2014-09-01 LAB — URINALYSIS, ROUTINE W REFLEX MICROSCOPIC
Bilirubin Urine: NEGATIVE
Glucose, UA: NEGATIVE mg/dL
Ketones, ur: NEGATIVE mg/dL
Leukocytes, UA: NEGATIVE
Nitrite: NEGATIVE
PROTEIN: NEGATIVE mg/dL
Specific Gravity, Urine: 1.021 (ref 1.005–1.030)
UROBILINOGEN UA: 1 mg/dL (ref 0.0–1.0)
pH: 6 (ref 5.0–8.0)

## 2014-09-01 LAB — I-STAT TROPONIN, ED: Troponin i, poc: 0.02 ng/mL (ref 0.00–0.08)

## 2014-09-01 LAB — HEMOGLOBIN AND HEMATOCRIT, BLOOD
HEMATOCRIT: 15.5 % — AB (ref 36.0–46.0)
Hemoglobin: 5.1 g/dL — CL (ref 12.0–15.0)

## 2014-09-01 MED ORDER — SODIUM CHLORIDE 0.9 % IV BOLUS (SEPSIS)
1000.0000 mL | INTRAVENOUS | Status: AC
  Administered 2014-09-01 (×2): 1000 mL via INTRAVENOUS

## 2014-09-01 MED ORDER — PIPERACILLIN-TAZOBACTAM 3.375 G IVPB
3.3750 g | Freq: Three times a day (TID) | INTRAVENOUS | Status: DC
  Administered 2014-09-02 – 2014-09-03 (×5): 3.375 g via INTRAVENOUS
  Filled 2014-09-01 (×5): qty 50

## 2014-09-01 MED ORDER — SODIUM CHLORIDE 0.9 % IV SOLN
1000.0000 mL | Freq: Once | INTRAVENOUS | Status: DC
Start: 2014-09-01 — End: 2014-09-01

## 2014-09-01 MED ORDER — VANCOMYCIN HCL IN DEXTROSE 1-5 GM/200ML-% IV SOLN
1000.0000 mg | Freq: Once | INTRAVENOUS | Status: AC
  Administered 2014-09-01: 1000 mg via INTRAVENOUS
  Filled 2014-09-01: qty 200

## 2014-09-01 MED ORDER — VANCOMYCIN HCL IN DEXTROSE 750-5 MG/150ML-% IV SOLN
750.0000 mg | Freq: Two times a day (BID) | INTRAVENOUS | Status: DC
  Administered 2014-09-02 – 2014-09-03 (×3): 750 mg via INTRAVENOUS
  Filled 2014-09-01 (×4): qty 150

## 2014-09-01 MED ORDER — SODIUM CHLORIDE 0.9 % IV SOLN: 1000.0000 mL | INTRAVENOUS | Status: DC

## 2014-09-01 MED ORDER — SODIUM CHLORIDE 0.9 % IV SOLN
Freq: Once | INTRAVENOUS | Status: AC
  Administered 2014-09-01: via INTRAVENOUS

## 2014-09-01 MED ORDER — PIPERACILLIN-TAZOBACTAM 3.375 G IVPB 30 MIN
3.3750 g | Freq: Once | INTRAVENOUS | Status: AC
  Administered 2014-09-01: 3.375 g via INTRAVENOUS
  Filled 2014-09-01: qty 50

## 2014-09-01 MED ORDER — POTASSIUM CHLORIDE CRYS ER 20 MEQ PO TBCR
60.0000 meq | EXTENDED_RELEASE_TABLET | Freq: Once | ORAL | Status: AC
  Administered 2014-09-01: 60 meq via ORAL
  Filled 2014-09-01: qty 3

## 2014-09-01 MED ORDER — ACETAMINOPHEN 325 MG PO TABS
650.0000 mg | ORAL_TABLET | Freq: Once | ORAL | Status: AC
  Administered 2014-09-01: 650 mg via ORAL
  Filled 2014-09-01: qty 2

## 2014-09-01 NOTE — Progress Notes (Signed)
ANTIBIOTIC CONSULT NOTE - INITIAL  Pharmacy Consult for zosyn/vancomycin Indication: Sepsis  Allergies  Allergen Reactions  . Keflex [Cephalexin] Rash    Patient Measurements: Weight: 125 lb (56.7 kg)   Vital Signs: Temp: 102.2 F (39 C) (04/30 2129) Temp Source: Oral (04/30 2129) BP: 103/77 mmHg (04/30 2129) Pulse Rate: 140 (04/30 2129) Intake/Output from previous day:   Intake/Output from this shift:    Labs: No results for input(s): WBC, HGB, PLT, LABCREA, CREATININE in the last 72 hours. Estimated Creatinine Clearance: 70.8 mL/min (by C-G formula based on Cr of 0.7). No results for input(s): VANCOTROUGH, VANCOPEAK, VANCORANDOM, GENTTROUGH, GENTPEAK, GENTRANDOM, TOBRATROUGH, TOBRAPEAK, TOBRARND, AMIKACINPEAK, AMIKACINTROU, AMIKACIN in the last 72 hours.   Microbiology: Recent Results (from the past 720 hour(s))  TECHNOLOGIST REVIEW     Status: None   Collection Time: 08/06/14  7:59 AM  Result Value Ref Range Status   Technologist Review Metas and Myelocytes present  Final  Urine culture     Status: None   Collection Time: 08/13/14 11:55 PM  Result Value Ref Range Status   Specimen Description URINE, CLEAN CATCH  Final   Special Requests Immunocompromised  Final   Colony Count   Final    6,000 COLONIES/ML Performed at [facility]    Culture   Final    INSIGNIFICANT GROWTH Performed at [facility]    Report Status 08/15/2014 FINAL  Final    Medical History: Past Medical History  Diagnosis Date  . Squamous cell carcinoma of right lung 06/29/14    Medications:   (Not in a hospital admission) Scheduled:   Infusions:  . piperacillin-tazobactam 3.375 g (09/01/14 2201)  . sodium chloride 1,000 mL (09/01/14 2201)  . vancomycin 1,000 mg (09/01/14 2200)   Assessment: Barbara Frost with [place] Lung Ca stage 3 now with fever.  S/p chemo 4/18. Zosyn/Vancomycin per Rx for Sepsis. (Keflex allergy=rash)  Goal of Therapy:  Vancomycin trough level 15-20  mcg/ml  Plan:   Zosyn 3.375 Gm IV q8h EI  Vancomycin 1Gm x1 then '750mg'$  IV q12h  F/u SCr/cultures/levels as needed  Lawana Pai R 09/01/2014,10:23 PM

## 2014-09-01 NOTE — ED Provider Notes (Signed)
CSN: [id number]     Arrival date & time 09/01/14  2117 History   First MD Initiated Contact with Patient 09/01/14 2132     Chief Complaint  Patient presents with  . 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  She states that the chest pain and cough have been present for the past couple of days.  She states that the chest pain is located right anterior chest and did not radiate.  She reports onset of fever this evening.  Temp is 102.2 F upon arrival in the ED.  She denies SOB, nausea, vomiting, diarrhea, or abdominal pain.  She does report some dysuria over the past month.  Denies urinary frequency or urgency.  Last chemo and last radiation was 08/20/14.  She is followed by Dr. Earlie Server with Oncology.  She denies history of PE or DVT.  No LE edema.  She has not taken anything for her symptoms prior to arrival.    Patient is a 44 y.o. female presenting with fever. The history is provided by the patient.  Fever   Past Medical History  Diagnosis Date  . 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 Femur closed reduction Right    Family History  Problem Relation Age of Onset  . Heart attack Mother   . Heart attack Father    History  Substance Use Topics  . Smoking status: Former Smoker -- 1.00 packs/day for 30 years    Types: Cigarettes    Quit date: 06/26/2014  . Smokeless tobacco: Never Used  . Alcohol Use: No     Comment: socially   OB History    No data available     Review of Systems  Constitutional: Positive for fever.  All other systems reviewed and are negative.     Allergies  Keflex  Home Medications   Prior to Admission medications   Medication Sig Start Date End Date Taking? Authorizing  Provider  albuterol (PROVENTIL HFA;VENTOLIN HFA) 108 (90 BASE) MCG/ACT inhaler Inhale 1-2 puffs into the lungs every 6 (six) hours as needed for wheezing or shortness of breath. 08/02/14  Yes Susanne Borders, NP  aspirin 325 MG EC tablet Take 325 mg by mouth daily.    Yes Historical Provider, MD  docusate sodium (COLACE) 100 MG capsule Take 1 capsule (100 mg total) by mouth 2 (two) times daily. Patient taking differently: Take 100 mg by mouth daily.  07/03/14  Yes Eugenie Filler, MD  emollient (BIAFINE) cream Apply 1 application topically daily.   Yes Arloa Koh, MD  ibuprofen (ADVIL,MOTRIN) 600 MG tablet Take 1 tablet (600 mg total) by mouth 4 (four) times daily. Take for 4 days then use as needed. 07/03/14  Yes Eugenie Filler, MD  lidocaine (XYLOCAINE) 2 % solution Patient: Mix 1part 2% viscous lidocaine, 1part H20. Swish and/or swallow 64m of this mixture, 330m before meals and at bedtime, up to QID 07/30/14  Yes SaEppie GibsonMD  oxyCODONE-acetaminophen (PERCOCET/ROXICET) 5-325 MG per tablet Take 1 tablet by mouth every 6 (six) hours as needed for severe pain. 08/14/14  Yes BoDomenic MorasPA-C  prochlorperazine (COMPAZINE) 10 MG tablet Take 1 tablet (10 mg total) by mouth every 6 (six) hours as needed for nausea or vomiting. 07/23/14  Yes KrErasmo Downer  R Curcio, NP  sucralfate (CARAFATE) 1 G tablet Dissolve 1 tablet in 10 mL H20 and swallow up to QID PRN soreness with swallowing 07/30/14  Yes Eppie Gibson, MD  doxycycline (VIBRAMYCIN) 100 MG capsule Take 1 capsule (100 mg total) by mouth 2 (two) times daily. Patient not taking: Reported on 09/01/2014 08/14/14   Domenic Moras, PA-C  iron polysaccharides (NIFEREX) 150 MG capsule Take 1 capsule (150 mg total) by mouth daily. Patient not taking: Reported on 09/01/2014 07/03/14   Eugenie Filler, MD  methylPREDNISolone (MEDROL DOSEPAK) 4 MG tablet follow package directions Patient not taking: Reported on 09/01/2014 08/02/14   Susanne Borders, NP  metoprolol  succinate (TOPROL-XL) 25 MG 24 hr tablet Take 12.5 mg by mouth 2 (two) times daily.    Historical Provider, MD  pantoprazole (PROTONIX) 40 MG tablet Take 1 tablet (40 mg total) by mouth daily at 6 (six) AM. Patient not taking: Reported on 09/01/2014 07/03/14   Eugenie Filler, MD   BP 120/73 mmHg  Pulse 106  Temp(Src) 102.2 F (39 C) (Oral)  Resp 16  Wt 125 lb (56.7 kg)  SpO2 97%  LMP 08/05/2014 Physical Exam  Constitutional: She appears well-developed and well-nourished.  HENT:  Head: Normocephalic and atraumatic.  Mouth/Throat: Oropharynx is clear and moist.  Neck: Normal range of motion. Neck supple.  Cardiovascular: Regular rhythm and normal heart sounds.  Tachycardia present.   Pulmonary/Chest: Effort normal and breath sounds normal. No respiratory distress. She has no wheezes. She has no rales. She exhibits no tenderness.  Abdominal: Soft. Bowel sounds are normal. She exhibits no distension and no mass. There is no tenderness. There is no rebound and no guarding.  Musculoskeletal: Normal range of motion.  Neurological: She is alert.  Skin: Skin is warm and dry.  Psychiatric: She has a normal mood and affect.  Nursing note and vitals reviewed.   ED Course  Procedures (including critical care time) Labs Review Labs Reviewed  CBC WITH DIFFERENTIAL/PLATELET - Abnormal; Notable for the following:    RBC 1.04 (*)    Hemoglobin 3.0 (*)    HCT 9.1 (*)    RDW 23.6 (*)    Monocytes Relative 14 (*)    Monocytes Absolute 1.3 (*)    All other components within normal limits  COMPREHENSIVE METABOLIC PANEL - Abnormal; Notable for the following:    Potassium 3.0 (*)    Glucose, Bld 117 (*)    Calcium 8.3 (*)    Albumin 2.6 (*)    All other components within normal limits  URINALYSIS, ROUTINE W REFLEX MICROSCOPIC - Abnormal; Notable for the following:    Hgb urine dipstick SMALL (*)    All other components within normal limits  HEMOGLOBIN AND HEMATOCRIT, BLOOD - Abnormal; Notable  for the following:    Hemoglobin 5.1 (*)    HCT 15.5 (*)    All other components within normal limits  APTT - Abnormal; Notable for the following:    aPTT 40 (*)    All other components within normal limits  FIBRINOGEN - Abnormal; Notable for the following:    Fibrinogen 687 (*)    All other components within normal limits  IRON AND TIBC - Abnormal; Notable for the following:    Iron 15 (*)    TIBC 133 (*)    All other components within normal limits  FERRITIN - Abnormal; Notable for the following:    Ferritin 413 (*)    All other components within normal  limits  RETICULOCYTES - Abnormal; Notable for the following:    RBC. 1.42 (*)    Retic Count, Manual 18.5 (*)    All other components within normal limits  COMPREHENSIVE METABOLIC PANEL - Abnormal; Notable for the following:    Potassium 3.4 (*)    Calcium 7.8 (*)    Total Protein 5.6 (*)    Albumin 2.2 (*)    AST 11 (*)    ALT 11 (*)    All other components within normal limits  CBC - Abnormal; Notable for the following:    RDW 19.6 (*)    All other components within normal limits  URINE CULTURE  MRSA PCR SCREENING  CULTURE, BLOOD (ROUTINE X 2)  CULTURE, BLOOD (ROUTINE X 2)  CULTURE, EXPECTORATED SPUTUM-ASSESSMENT  GRAM STAIN  URINE MICROSCOPIC-ADD ON  PROTIME-INR  VITAMIN B12  FOLATE  INFLUENZA PANEL BY PCR (TYPE A & B, H1N1)  STREP PNEUMONIAE URINARY ANTIGEN  OCCULT BLOOD X 1 CARD TO LAB, STOOL  LEGIONELLA ANTIGEN, URINE  CBC  BASIC METABOLIC PANEL  I-STAT CG4 LACTIC ACID, ED  I-STAT TROPOININ, ED  I-STAT CG4 LACTIC ACID, ED  POC OCCULT BLOOD, ED  TYPE AND SCREEN  ABO/RH  PREPARE RBC (CROSSMATCH)  PREPARE RBC (CROSSMATCH)    Imaging Review Dg Chest Port 1 View  09/01/2014   CLINICAL DATA:  Acute onset of fever. Current history of lung cancer, status post chemotherapy and radiation therapy. Right-sided chest pain. Initial encounter.  EXAM: PORTABLE CHEST - 1 VIEW  COMPARISON:  CT of the chest performed  08/03/2014, and chest radiograph performed 08/13/2014  FINDINGS: There appears to be increased necrosis involving the patient's right upper lobe lung mass, with underlying right upper lobe airspace opacification raising concern for postobstructive pneumonia given the patient's symptoms. The left lung appears clear. No pleural effusion or pneumothorax is seen.  The cardiomediastinal silhouette is normal in size. No acute osseous abnormalities are identified.  IMPRESSION: Increased necrosis involving the patient's right upper lobe lung mass, status post chemotherapy and radiation therapy, with underlying right upper lobe airspace opacification raising concern for postobstructive pneumonia, given the patient's symptoms.   Electronically Signed   By: Garald Balding M.D.   On: 09/01/2014 22:09     EKG Interpretation   Date/Time:  Saturday September 01 2014 21:37:55 EDT Ventricular Rate:  132 PR Interval:  127 QRS Duration: 77 QT Interval:  293 QTC Calculation: 434 R Axis:   48 Text Interpretation:  Sinus tachycardia Atrial premature complex Minimal  ST depression, lateral leads Since last tracing rate faster Confirmed by  KNAPP  MD-J, JON ([id number]) on 09/01/2014 9:45:29 PM      MDM   Final diagnoses:  None   Patient with a history of Squamous Cell Carcinoma of the right lung presents today with fever, cough, and chest pain.  Patient with a temp of 102.2 F upon arrival in the ED.  She is also tachycardic.  Sepsis orders immediately ordered.  Patient given 30 mg/kg IVF and started on Vancomycin and Zosyn immediately upon arrival in the ED.  CXR showing Post Obstructive Pneumonia.  Patient is not hypoxic.  No ischemic changes on EKG.  Patient also found to be severely anemic with a hemoglobin of 5.1.  Patient given PRBC in the ED.  Hemoccult is negative.  Patient admitted to [facility] for further management of anemia and Pneumonia.      Hyman Bible, PA-C 09/02/14 2241

## 2014-09-01 NOTE — ED Notes (Signed)
Critical HGB 3.0, Dr. Tomi Bamberger, Nira Conn PA, Sharrie Rothman and Luana notifed

## 2014-09-01 NOTE — ED Notes (Signed)
Pt reports she had a percocet approx 2 hours ago and ibuprofen approx 2 hours ago as well.

## 2014-09-01 NOTE — ED Provider Notes (Signed)
Pt presents with fever and tachycardia.  Sx concerning for sepsis associated with her immunocompromised state from chemotherapy.  We'll start IV fluid hydration and empiric antibiotics following the sepsis protocol.  CXR possible post obstructive pna. Initial hemoglobin is reported to be 3.  However patient is not pancytopenic. We will type and cross but repeat this test to confirm.  Plan on hospitalization for further treatment.  CRITICAL CARE Performed by: BFXOV,ANV Total critical care time: 40 Critical care time was exclusive of separately billable procedures and treating other patients. Critical care was necessary to treat or prevent imminent or life-threatening deterioration. Critical care was time spent personally by me on the following activities: development of treatment plan with patient and/or surrogate as well as nursing, discussions with consultants, evaluation of patient's response to treatment, examination of patient, obtaining history from patient or surrogate, ordering and performing treatments and interventions, ordering and review of laboratory studies, ordering and review of radiographic studies, pulse oximetry and re-evaluation of patient's condition.   EKG Interpretation  Date/Time:  Saturday September 01 2014 21:37:55 EDT Ventricular Rate:  132 PR Interval:  127 QRS Duration: 77 QT Interval:  293 QTC Calculation: 434 R Axis:   48 Text Interpretation:  Sinus tachycardia Atrial premature complex Minimal ST depression, lateral leads Since last tracing rate faster Confirmed by Breeonna Mone  MD-J, Reiss Mowrey ([id number]) on 09/01/2014 9:45:29 PM     examination/treatment/procedure(s) were conducted as a shared visit with non-physician practitioner(s) and myself.  I personally evaluated the patient during the encounter.    Dorie Rank, MD 09/01/14 [phone]

## 2014-09-01 NOTE — ED Notes (Addendum)
Pt presents with c/o fever. Pt is a cancer patient, last chemo and radiation treatment on the 18th of this month. Pt reports she noticed the fever approx 2 hours ago. Pt reports she feels pain in her right lung, constant but more when she breaths. Pt has lung cancer.

## 2014-09-01 NOTE — ED Notes (Signed)
Pt unable to void at this time. 

## 2014-09-02 ENCOUNTER — Inpatient Hospital Stay (HOSPITAL_COMMUNITY): Payer: Medicaid Other

## 2014-09-02 ENCOUNTER — Encounter (HOSPITAL_COMMUNITY): Payer: Self-pay | Admitting: Internal Medicine

## 2014-09-02 DIAGNOSIS — E876 Hypokalemia: Secondary | ICD-10-CM | POA: Diagnosis present

## 2014-09-02 DIAGNOSIS — C3411 Malignant neoplasm of upper lobe, right bronchus or lung: Secondary | ICD-10-CM | POA: Diagnosis not present

## 2014-09-02 DIAGNOSIS — Z87891 Personal history of nicotine dependence: Secondary | ICD-10-CM | POA: Diagnosis not present

## 2014-09-02 DIAGNOSIS — R0602 Shortness of breath: Secondary | ICD-10-CM | POA: Diagnosis present

## 2014-09-02 DIAGNOSIS — Z881 Allergy status to other antibiotic agents status: Secondary | ICD-10-CM | POA: Diagnosis not present

## 2014-09-02 DIAGNOSIS — D638 Anemia in other chronic diseases classified elsewhere: Secondary | ICD-10-CM | POA: Diagnosis present

## 2014-09-02 DIAGNOSIS — Z7982 Long term (current) use of aspirin: Secondary | ICD-10-CM | POA: Diagnosis not present

## 2014-09-02 DIAGNOSIS — A419 Sepsis, unspecified organism: Secondary | ICD-10-CM | POA: Diagnosis present

## 2014-09-02 DIAGNOSIS — Y842 Radiological procedure and radiotherapy as the cause of abnormal reaction of the patient, or of later complication, without mention of misadventure at the time of the procedure: Secondary | ICD-10-CM | POA: Diagnosis present

## 2014-09-02 DIAGNOSIS — J189 Pneumonia, unspecified organism: Secondary | ICD-10-CM | POA: Diagnosis not present

## 2014-09-02 DIAGNOSIS — J9601 Acute respiratory failure with hypoxia: Secondary | ICD-10-CM | POA: Diagnosis present

## 2014-09-02 DIAGNOSIS — K1233 Oral mucositis (ulcerative) due to radiation: Secondary | ICD-10-CM | POA: Diagnosis not present

## 2014-09-02 DIAGNOSIS — B370 Candidal stomatitis: Secondary | ICD-10-CM | POA: Diagnosis present

## 2014-09-02 DIAGNOSIS — C3491 Malignant neoplasm of unspecified part of right bronchus or lung: Secondary | ICD-10-CM

## 2014-09-02 DIAGNOSIS — D6481 Anemia due to antineoplastic chemotherapy: Secondary | ICD-10-CM | POA: Diagnosis present

## 2014-09-02 DIAGNOSIS — J188 Other pneumonia, unspecified organism: Secondary | ICD-10-CM | POA: Diagnosis not present

## 2014-09-02 DIAGNOSIS — R1310 Dysphagia, unspecified: Secondary | ICD-10-CM | POA: Diagnosis present

## 2014-09-02 DIAGNOSIS — J181 Lobar pneumonia, unspecified organism: Secondary | ICD-10-CM | POA: Diagnosis present

## 2014-09-02 DIAGNOSIS — C3490 Malignant neoplasm of unspecified part of unspecified bronchus or lung: Secondary | ICD-10-CM | POA: Diagnosis not present

## 2014-09-02 DIAGNOSIS — D649 Anemia, unspecified: Secondary | ICD-10-CM | POA: Diagnosis not present

## 2014-09-02 DIAGNOSIS — D62 Acute posthemorrhagic anemia: Secondary | ICD-10-CM | POA: Diagnosis present

## 2014-09-02 DIAGNOSIS — K1231 Oral mucositis (ulcerative) due to antineoplastic therapy: Secondary | ICD-10-CM | POA: Diagnosis present

## 2014-09-02 DIAGNOSIS — T451X5A Adverse effect of antineoplastic and immunosuppressive drugs, initial encounter: Secondary | ICD-10-CM | POA: Diagnosis present

## 2014-09-02 LAB — RETICULOCYTES
RBC.: 1.42 MIL/uL — ABNORMAL LOW (ref 3.87–5.11)
RETIC CT PCT: 1.3 % (ref 0.4–3.1)
Retic Count, Absolute: 18.5 10*3/uL — ABNORMAL LOW (ref 19.0–186.0)

## 2014-09-02 LAB — COMPREHENSIVE METABOLIC PANEL
ALK PHOS: 58 U/L (ref 38–126)
ALT: 11 U/L — AB (ref 14–54)
ANION GAP: 5 (ref 5–15)
AST: 11 U/L — ABNORMAL LOW (ref 15–41)
Albumin: 2.2 g/dL — ABNORMAL LOW (ref 3.5–5.0)
BILIRUBIN TOTAL: 1.2 mg/dL (ref 0.3–1.2)
BUN: 6 mg/dL (ref 6–20)
CALCIUM: 7.8 mg/dL — AB (ref 8.9–10.3)
CHLORIDE: 109 mmol/L (ref 101–111)
CO2: 23 mmol/L (ref 22–32)
Creatinine, Ser: 0.49 mg/dL (ref 0.44–1.00)
GFR calc Af Amer: >60 mL/min (ref >60)
Glucose, Bld: 86 mg/dL (ref 70–99)
Potassium: 3.4 mmol/L — ABNORMAL LOW (ref 3.5–5.1)
Sodium: 137 mmol/L (ref 135–145)
Total Protein: 5.6 g/dL — ABNORMAL LOW (ref 6.5–8.1)

## 2014-09-02 LAB — URINE CULTURE
Colony Count: NO GROWTH
Culture: NO GROWTH

## 2014-09-02 LAB — CBC
HEMATOCRIT: 36.1 % (ref 36.0–46.0)
HEMOGLOBIN: 12 g/dL (ref 12.0–15.0)
MCH: 28 pg (ref 26.0–34.0)
MCHC: 33.2 g/dL (ref 30.0–36.0)
MCV: 84.3 fL (ref 78.0–100.0)
Platelets: 224 10*3/uL (ref 150–400)
RBC: 4.28 MIL/uL (ref 3.87–5.11)
RDW: 19.6 % — ABNORMAL HIGH (ref 11.5–15.5)
WBC: 8.5 10*3/uL (ref 4.0–10.5)

## 2014-09-02 LAB — PROTIME-INR
INR: 1.1 (ref 0.00–1.49)
Prothrombin Time: 14.3 seconds (ref 11.6–15.2)

## 2014-09-02 LAB — PREPARE RBC (CROSSMATCH)

## 2014-09-02 LAB — INFLUENZA PANEL BY PCR (TYPE A & B)
H1N1 flu by pcr: NOT DETECTED
INFLBPCR: NEGATIVE
Influenza A By PCR: NEGATIVE

## 2014-09-02 LAB — I-STAT CG4 LACTIC ACID, ED: Lactic Acid, Venous: 0.7 mmol/L (ref 0.5–2.0)

## 2014-09-02 LAB — IRON AND TIBC
IRON: 15 ug/dL — AB (ref 28–170)
SATURATION RATIOS: 11 % (ref 10.4–31.8)
TIBC: 133 ug/dL — ABNORMAL LOW (ref 250–450)
UIBC: 118 ug/dL

## 2014-09-02 LAB — ABO/RH: ABO/RH(D): B POS

## 2014-09-02 LAB — FOLATE: FOLATE: 12.4 ng/mL (ref >5.9)

## 2014-09-02 LAB — MRSA PCR SCREENING: MRSA BY PCR: NEGATIVE

## 2014-09-02 LAB — STREP PNEUMONIAE URINARY ANTIGEN: STREP PNEUMO URINARY ANTIGEN: NEGATIVE

## 2014-09-02 LAB — FERRITIN: FERRITIN: 413 ng/mL — AB (ref 11–307)

## 2014-09-02 LAB — VITAMIN B12: VITAMIN B 12: 871 pg/mL (ref 180–914)

## 2014-09-02 LAB — FIBRINOGEN: Fibrinogen: 687 mg/dL — ABNORMAL HIGH (ref 204–475)

## 2014-09-02 LAB — POC OCCULT BLOOD, ED: FECAL OCCULT BLD: NEGATIVE

## 2014-09-02 LAB — APTT: APTT: 40 s — AB (ref 24–37)

## 2014-09-02 MED ORDER — ACETAMINOPHEN 325 MG PO TABS
650.0000 mg | ORAL_TABLET | Freq: Once | ORAL | Status: AC
  Administered 2014-09-02: 650 mg via ORAL
  Filled 2014-09-02: qty 2

## 2014-09-02 MED ORDER — ALBUTEROL SULFATE HFA 108 (90 BASE) MCG/ACT IN AERS: 1.0000 | INHALATION_SPRAY | Freq: Four times a day (QID) | RESPIRATORY_TRACT | Status: DC | PRN

## 2014-09-02 MED ORDER — IOHEXOL 350 MG/ML SOLN
100.0000 mL | Freq: Once | INTRAVENOUS | Status: AC | PRN
  Administered 2014-09-02: 100 mL via INTRAVENOUS

## 2014-09-02 MED ORDER — ACETAMINOPHEN 325 MG PO TABS
650.0000 mg | ORAL_TABLET | Freq: Four times a day (QID) | ORAL | Status: DC | PRN
  Administered 2014-09-02 – 2014-09-04 (×2): 650 mg via ORAL
  Filled 2014-09-02 (×2): qty 2

## 2014-09-02 MED ORDER — DOCUSATE SODIUM 100 MG PO CAPS
100.0000 mg | ORAL_CAPSULE | Freq: Every day | ORAL | Status: DC
  Administered 2014-09-02 – 2014-09-06 (×5): 100 mg via ORAL
  Filled 2014-09-02 (×5): qty 1

## 2014-09-02 MED ORDER — PIPERACILLIN-TAZOBACTAM 3.375 G IVPB 30 MIN
3.3750 g | Freq: Three times a day (TID) | INTRAVENOUS | Status: DC
  Filled 2014-09-02: qty 50

## 2014-09-02 MED ORDER — POLYSACCHARIDE IRON COMPLEX 150 MG PO CAPS
150.0000 mg | ORAL_CAPSULE | Freq: Every day | ORAL | Status: DC
  Administered 2014-09-02 – 2014-09-06 (×5): 150 mg via ORAL
  Filled 2014-09-02 (×5): qty 1

## 2014-09-02 MED ORDER — SUCRALFATE 1 G PO TABS
1.0000 g | ORAL_TABLET | Freq: Three times a day (TID) | ORAL | Status: DC
  Administered 2014-09-02 – 2014-09-06 (×16): 1 g via ORAL
  Filled 2014-09-02 (×21): qty 1

## 2014-09-02 MED ORDER — PRUTECT EX EMUL
1.0000 "application " | Freq: Every day | CUTANEOUS | Status: DC
  Administered 2014-09-04 – 2014-09-06 (×3): 1 via TOPICAL
  Filled 2014-09-02: qty 45

## 2014-09-02 MED ORDER — OXYCODONE-ACETAMINOPHEN 5-325 MG PO TABS
1.0000 | ORAL_TABLET | Freq: Four times a day (QID) | ORAL | Status: DC | PRN
  Administered 2014-09-02 – 2014-09-03 (×4): 1 via ORAL
  Filled 2014-09-02 (×4): qty 1

## 2014-09-02 MED ORDER — PANTOPRAZOLE SODIUM 40 MG PO TBEC
40.0000 mg | DELAYED_RELEASE_TABLET | Freq: Every day | ORAL | Status: DC
  Administered 2014-09-02 – 2014-09-06 (×5): 40 mg via ORAL
  Filled 2014-09-02 (×8): qty 1

## 2014-09-02 MED ORDER — SODIUM CHLORIDE 0.9 % IV SOLN
Freq: Once | INTRAVENOUS | Status: AC
  Administered 2014-09-02: 05:00:00 via INTRAVENOUS

## 2014-09-02 MED ORDER — ALBUTEROL SULFATE (2.5 MG/3ML) 0.083% IN NEBU: 2.5000 mg | INHALATION_SOLUTION | Freq: Four times a day (QID) | RESPIRATORY_TRACT | Status: DC | PRN

## 2014-09-02 MED ORDER — SODIUM CHLORIDE 0.9 % IV SOLN
INTRAVENOUS | Status: AC
  Administered 2014-09-02: 06:00:00 via INTRAVENOUS

## 2014-09-02 MED ORDER — DIPHENHYDRAMINE HCL 25 MG PO CAPS
25.0000 mg | ORAL_CAPSULE | Freq: Once | ORAL | Status: AC
  Administered 2014-09-02: 25 mg via ORAL
  Filled 2014-09-02: qty 1

## 2014-09-02 MED ORDER — POTASSIUM CHLORIDE CRYS ER 20 MEQ PO TBCR
40.0000 meq | EXTENDED_RELEASE_TABLET | Freq: Once | ORAL | Status: AC
  Administered 2014-09-02: 40 meq via ORAL
  Filled 2014-09-02: qty 2

## 2014-09-02 MED ORDER — ALBUTEROL SULFATE (2.5 MG/3ML) 0.083% IN NEBU
2.5000 mg | INHALATION_SOLUTION | RESPIRATORY_TRACT | Status: DC | PRN
  Administered 2014-09-05: 2.5 mg via RESPIRATORY_TRACT
  Filled 2014-09-02: qty 3

## 2014-09-02 NOTE — Progress Notes (Addendum)
Patient ID: Barbara Frost, female   DOB: 1971-02-09, 44 y.o.   MRN: [id number]  [facility] PROGRESS NOTE  Barbara Frost OXB:[id number] DOB: [birth year] DOA: 09/01/2014 PCP: [name], MD   Brief narrative:    Patient is 44 year old female with known squamous cell carcinoma of the right lung diagnosed 06/29/2014, follows with Dr. Julien Nordmann, has been getting radiation therapy by Dr. Valere Dross as well as chemotherapy with carboplatin and paclitaxel. Patient presented to [facility] emergency department with main concern of several days duration of progressively worsening dyspnea, initially noted with exertion and has progressed to dyspnea at rest over the past 24 hours, associated with intermittent mixed episodes of nonproductive and productive cough of clear to yellow sputum, subjective fevers and chills, malaise, poor oral intake. Patient also reports associated chest discomfort with coughing spells. She denies any specific abdominal concerns, no specific urinary concerns. Patient reports progressive malaise, has been mostly bed bound over the past several days.   In emergency department, patient noted to be hemodynamically stable, vital signs notable for T102.1F, HR 140, RR 29, BP 100/77, oxygen saturation 90% on room air. Blood work notable for hg 3 and repeat CBC with Hg 5.1. 3 units of PRBC requested for transfusion. Chest x-ray concerning for possible postobstructive pneumonia. TR[facility] asked to admit to step down unit for further evaluation and management  Assessment/Plan:    Active Problems:   Sepsis secondary to lobar pneumonia, CAP - Criteria for sepsis met on admission with T102.1F, HR 140, RR 29, source pneumonia - Patient started on broad spectrum antibiotics vancomycin and Zosyn given immunocompromise state - Patient is clinically improving, afebrile since admission with heart rate and respiratory rate stable - Continue broad-spectrum antibiotics, day 2 and narrow down as clinically  indicated - Influenza panel pending, follow-up and strep pneumo and urine Legionella - Keep in step down for now - Keep on oxygen to maintain oxygen saturations above 92% - will discuss CT chest with PCCm to see if fungal coverage needed    Acute hypoxic respiratory failure secondary to lobar pneumonia - Antibiotics as noted above, oxygen via nasal canula is sufficient for now   Acute on chronic blood loss anemia, IDA - Possibly related to chemotherapy - 3 units of PRBC transfused, repeat hemoglobin - Anemia panel pending   Non-small cell carcinoma of lung, stage 3 - will notify Dr. Julien Nordmann of pt's admission    Hypokalemia - supplement and repeat BMP in AM   Back area wound - ? Radiation induced - wound care team consulted   DVT prophylaxis - SCD's  Code Status: Full.  Family Communication:  plan of care discussed with the patient Disposition Plan: still requiring IV ABX, sepsis work up in progress, not ready for d/c   IV access:  Peripheral IV  Procedures and diagnostic studies:    Ct Angio Chest Pe W/cm &/or Wo Cm  09/02/2014  No evidence of pulmonary embolus. 2. Large right upper lobe lung mass is relatively stable in size, measuring 7.8 cm. Significantly increased cavitation. Given its appearance, underlying fungal infection cannot be entirely excluded. 3. New patchy airspace opacification in the periphery of the right upper lobe raises concern for superimposed infection, possibly atypical in nature. Additional mild patchy opacity noted in the central portions of the lungs bilaterally, also concerning for pneumonia. 4. Mild interstitial prominence seen. Mild emphysematous change noted bilaterally, more prominent at the left upper lobe. 5. 1.7 cm right hilar node is concerning for nodal metastasis, similar in appearance to  the prior study.   Dg Chest Port 1 View  09/01/2014   Increased necrosis involving the patient's right upper lobe lung mass, status post chemotherapy and radiation  therapy, with underlying right upper lobe airspace opacification raising concern for postobstructive pneumonia, given the patient's symptoms.     Medical Consultants:  None   Other Consultants:  None   IAnti-Infectives:   Vancomycin 5/1 --> Zosyn 5/1 -->  [name], MD  [facility] Pager [phone]  If 7PM-7AM, please contact night-coverage [email or web address] Password [email or web address] 09/02/2014, 10:30 AM   LOS: 0 days   HPI/Subjective: No events overnight. Pt with persistent exertional dyspnea and coughing, no chest pain.   Objective: Filed Vitals:   09/02/14 0508 09/02/14 0523 09/02/14 0600 09/02/14 0800  BP:  118/87 111/73 131/93  Pulse: 98 95 85 88  Temp:  98.5 F (36.9 C)  98.6 F (37 C)  TempSrc:  Oral  Oral  Resp: $Remo'21 21 19 18  'IbtUC$ Height:      Weight:      SpO2: 95% 90% 95% 96%    Intake/Output Summary (Last 24 hours) at 09/02/14 1030 Last data filed at 09/02/14 0800  Gross per 24 hour  Intake  807.5 ml  Output   1100 ml  Net -292.5 ml    Exam:   General:  Pt is alert, follows commands appropriately, not in acute distress  Cardiovascular: Regular rate and rhythm, S1/S2, no murmurs, no rubs, no gallops  Respiratory: Course breath sounds bilaterally with no wheezing  Abdomen: Soft, non tender, non distended, bowel sounds present, no guarding  Extremities: No edema, pulses DP and PT palpable bilaterally  Neuro: Grossly nonfocal  Data Reviewed: Basic Metabolic Panel:  Recent Labs Lab 09/01/14 2148 09/02/14 0906  NA 141 137  K 3.0* 3.4*  CL 108 109  CO2 26 23  GLUCOSE 117* 86  BUN 11 6  CREATININE 0.56 0.49  CALCIUM 8.3* 7.8*   Liver Function Tests:  Recent Labs Lab 09/01/14 2148 09/02/14 0906  AST 17 11*  ALT 15 11*  ALKPHOS 72 58  BILITOT 0.5 1.2  PROT 6.2 5.6*  ALBUMIN 2.6* 2.2*   CBC:  Recent Labs Lab 09/01/14 2148 09/01/14 2253  WBC 9.4  --   NEUTROABS 7.0  --   HGB 3.0* 5.1*  HCT 9.1* 15.5*  MCV 87.5  --   PLT 376  --      Scheduled Meds: . docusate sodium  100 mg Oral Daily  . iron polysaccharides  150 mg Oral Daily  . pantoprazole  40 mg Oral Q0600  . piperacillin-tazobactam (ZOSYN)  IV  3.375 g Intravenous Q8H  . PRUTECT  1 application Topical Daily  . sucralfate  1 g Oral TID WC & HS  . vancomycin  750 mg Intravenous Q12H   Continuous Infusions: . sodium chloride 75 mL/hr at 09/02/14 0548

## 2014-09-02 NOTE — H&P (Signed)
PCP: [name], [place]  Referring provider Heather   Chief Complaint: Fever cough or shortness of breath   HPI: Barbara Frost is a 44 y.o. 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   Patient with known history of squamous cell carcinoma diagnosed in [place] 2016 patient originally presented to large mass right upper lobe and mediastinal invasion. She has been getting radiation therapy by Dr. Valere Dross in beginning chemotherapy as well with carboplatin and paclitaxel. Some effusion was in April 18.  The patient presented to emergency department with fever up to 102.2. She's been endorsing chest pain which is worse with deep breaths and coughing this is unchanged from prior.  In emergency department she was noted to be severely anemic with hemoglobin down to 3.0 and up to 5.1 on repeat. Patient had no prior history of significant anemia. On the 18th of April her hemoglobin was 12. She denies blood in stool, no black stools, She has been having vaginal sppoting for the past 3 weeks.  Chest x-ray showing possible postobstructive pneumonia  Hospitalist was called for admission for severe anemia and presumed postobstructive pneumonia  Review of Systems:    Pertinent positives include: Fevers, chills, chest pain, vaginal bleeding, productive cough, fatigue,  Constitutional:  No weight loss, night sweats,  weight loss   HEENT:  No headaches, Difficulty swallowing,Tooth/dental problems,Sore throat,  No sneezing, itching, ear ache, nasal congestion, post nasal drip,  Cardio-vascular:  No Orthopnea, PND, anasarca, dizziness, palpitations.no Bilateral lower extremity swelling  GI:  No heartburn, indigestion, abdominal pain, nausea, vomiting, diarrhea, change in bowel habits, loss of appetite, melena, blood in stool, hematemesis Resp:  no shortness of breath at rest. No dyspnea on exertion,  No excess mucus, no  No non-productive cough, No coughing up of blood.No change in color of mucus.No wheezing. Skin:  no rash or lesions. No jaundice GU:  no dysuria, change in color of urine, no urgency or frequency. No straining to urinate.  No flank pain.  Musculoskeletal:  No joint pain or no joint swelling. No decreased range of motion. No back pain.  Psych:  No change in mood or affect. No depression or anxiety. No memory loss.  Neuro: no localizing neurological complaints, no tingling, no weakness, no double vision, no gait abnormality, no slurred speech, no confusion  Otherwise ROS are negative except for above, 10 systems were reviewed  Past Medical History: Past Medical History  Diagnosis Date  . 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 Femur closed reduction Right      Medications: Prior to Admission medications   Medication Sig Start Date End Date Taking? Authorizing Provider  albuterol (PROVENTIL HFA;VENTOLIN HFA) 108 (90 BASE) MCG/ACT inhaler Inhale 1-2 puffs into the lungs every 6 (six) hours as needed for wheezing or shortness of breath. 08/02/14  Yes Susanne Borders, NP  aspirin 325 MG EC tablet Take 325 mg by mouth daily.    Yes Historical Provider, MD  docusate sodium (COLACE) 100 MG capsule Take 1 capsule (100 mg total) by mouth 2 (two) times daily. Patient taking differently: Take 100 mg by mouth daily.  07/03/14  Yes Eugenie Filler, MD  emollient (BIAFINE) cream Apply 1 application topically daily.   Yes Arloa Koh, MD  ibuprofen (ADVIL,MOTRIN) 600 MG tablet Take 1 tablet (600 mg total) by mouth 4 (four)  times daily. Take for 4 days then use as needed. 07/03/14  Yes Eugenie Filler, MD  lidocaine (XYLOCAINE) 2 % solution Patient: Mix 1part 2% viscous lidocaine, 1part H20. Swish and/or swallow 99m of this mixture, 358m before meals and at bedtime, up to QID 07/30/14  Yes SaEppie GibsonMD    oxyCODONE-acetaminophen (PERCOCET/ROXICET) 5-325 MG per tablet Take 1 tablet by mouth every 6 (six) hours as needed for severe pain. 08/14/14  Yes BoDomenic MorasPA-C  prochlorperazine (COMPAZINE) 10 MG tablet Take 1 tablet (10 mg total) by mouth every 6 (six) hours as needed for nausea or vomiting. 07/23/14  Yes KrMaryanna ShapeNP  sucralfate (CARAFATE) 1 G tablet Dissolve 1 tablet in 10 mL H20 and swallow up to QID PRN soreness with swallowing 07/30/14  Yes SaEppie GibsonMD  doxycycline (VIBRAMYCIN) 100 MG capsule Take 1 capsule (100 mg total) by mouth 2 (two) times daily. Patient not taking: Reported on 09/01/2014 08/14/14   BoDomenic MorasPA-C  iron polysaccharides (NIFEREX) 150 MG capsule Take 1 capsule (150 mg total) by mouth daily. Patient not taking: Reported on 09/01/2014 07/03/14   DaEugenie FillerMD  methylPREDNISolone (MEDROL DOSEPAK) 4 MG tablet follow package directions Patient not taking: Reported on 09/01/2014 08/02/14   CySusanne BordersNP  metoprolol succinate (TOPROL-XL) 25 MG 24 hr tablet Take 12.5 mg by mouth 2 (two) times daily.    Historical Provider, MD  pantoprazole (PROTONIX) 40 MG tablet Take 1 tablet (40 mg total) by mouth daily at 6 (six) AM. Patient not taking: Reported on 09/01/2014 07/03/14   DaEugenie FillerMD    Allergies:   Allergies  Allergen Reactions  . Keflex [Cephalexin] Rash    Social History:  Ambulatory  independently  Lives at home  With family     reports that she quit smoking about 2 months ago. Her smoking use included Cigarettes. She has a 30 pack-year smoking history. She has never used smokeless tobacco. She reports that she does not drink alcohol or use illicit drugs.    Family History: family history includes Diabetes in her mother; Heart attack in her father and mother.    Physical Exam: Patient Vitals for the past 24 hrs:  BP Temp Temp src Pulse Resp SpO2 Weight  09/01/14 2315 105/65 mmHg - - 101 (!) 0 96 % -  09/01/14 2302 100/77 mmHg  - - 110 20 95 % -  09/01/14 2236 120/73 mmHg - - 106 16 97 % -  09/01/14 2230 114/65 mmHg - - 110 13 97 % -  09/01/14 2215 116/70 mmHg - - (!) 127 16 98 % -  09/01/14 2148 - - - - - - 56.7 kg (125 lb)  09/01/14 2130 103/77 mmHg - - (!) 140 - 97 % -  09/01/14 2129 103/77 mmHg 102.2 F (39 C) Oral (!) 140 18 97 % -    1. General:  in No Acute distress 2. Psychological: Alert and  Oriented 3. Head/ENT:     Dry Mucous Membranes                          Head Non traumatic, neck supple                          Normal   Dentition 4. SKIN:   decreased Skin turgor,  Skin clean Dry and intact no rash, pale, radiation induced  burn to the skin on the right side of her back 5. Heart: Regular rate and rhythm no Murmur, Rub or gallop 6. Lungs: no wheezes some crackles  Decreased breath sounds in right  7. Abdomen: Soft, non-tender, Non distended 8. Lower extremities: no clubbing, cyanosis, or edema 9. Neurologically Grossly intact, moving all 4 extremities equally 10. MSK: Normal range of motion  body mass index is 24.41 kg/(m^2).   Labs on Admission:   Results for orders placed or performed during the hospital encounter of 09/01/14 (from the past 24 hour(s))  CBC WITH DIFFERENTIAL     Status: Abnormal   Collection Time: 09/01/14  9:48 PM  Result Value Ref Range   WBC 9.4 4.0 - 10.5 K/uL   RBC 1.04 (L) 3.87 - 5.11 MIL/uL   Hemoglobin 3.0 (LL) 12.0 - 15.0 g/dL   HCT 9.1 (L) 36.0 - 46.0 %   MCV 87.5 78.0 - 100.0 fL   MCH 28.8 26.0 - 34.0 pg   MCHC 33.0 30.0 - 36.0 g/dL   RDW 23.6 (H) 11.5 - 15.5 %   Platelets 376 150 - 400 K/uL   Neutrophils Relative % 74 43 - 77 %   Lymphocytes Relative 12 12 - 46 %   Monocytes Relative 14 (H) 3 - 12 %   Eosinophils Relative 0 0 - 5 %   Basophils Relative 0 0 - 1 %   Neutro Abs 7.0 1.7 - 7.7 K/uL   Lymphs Abs 1.1 0.7 - 4.0 K/uL   Monocytes Absolute 1.3 (H) 0.1 - 1.0 K/uL   Eosinophils Absolute 0.0 0.0 - 0.7 K/uL   Basophils Absolute 0.0 0.0 - 0.1  K/uL   WBC Morphology DOHLE BODIES   Comprehensive metabolic panel     Status: Abnormal   Collection Time: 09/01/14  9:48 PM  Result Value Ref Range   Sodium 141 135 - 145 mmol/L   Potassium 3.0 (L) 3.5 - 5.1 mmol/L   Chloride 108 96 - 112 mmol/L   CO2 26 19 - 32 mmol/L   Glucose, Bld 117 (H) 70 - 99 mg/dL   BUN 11 6 - 23 mg/dL   Creatinine, Ser 0.56 0.50 - 1.10 mg/dL   Calcium 8.3 (L) 8.4 - 10.5 mg/dL   Total Protein 6.2 6.0 - 8.3 g/dL   Albumin 2.6 (L) 3.5 - 5.2 g/dL   AST 17 0 - 37 U/L   ALT 15 0 - 35 U/L   Alkaline Phosphatase 72 39 - 117 U/L   Total Bilirubin 0.5 0.3 - 1.2 mg/dL   GFR calc non Af Amer >90 >90 mL/min   GFR calc Af Amer >90 >90 mL/min   Anion gap 7 5 - 15  I-stat troponin, ED     Status: None   Collection Time: 09/01/14 10:14 PM  Result Value Ref Range   Troponin i, poc 0.02 0.00 - 0.08 ng/mL   Comment 3          I-Stat CG4 Lactic Acid, ED (not at [facility])     Status: None   Collection Time: 09/01/14 10:17 PM  Result Value Ref Range   Lactic Acid, Venous 1.97 0.5 - 2.0 mmol/L  Type and screen for Red Blood Exchange     Status: None (Preliminary result)   Collection Time: 09/01/14 10:45 PM  Result Value Ref Range   ABO/RH(D) B POS    Antibody Screen NEG    Sample Expiration 09/04/2014    Unit Number [id number]  Blood Component Type RED CELLS,LR    Unit division 00    Status of Unit ALLOCATED    Transfusion Status OK TO TRANSFUSE    Crossmatch Result Compatible    Unit Number [id number]    Blood Component Type RED CELLS,LR    Unit division 00    Status of Unit ALLOCATED    Transfusion Status OK TO TRANSFUSE    Crossmatch Result Compatible   ABO/Rh     Status: None   Collection Time: 09/01/14 10:45 PM  Result Value Ref Range   ABO/RH(D) B POS   Prepare RBC     Status: None   Collection Time: 09/01/14 10:45 PM  Result Value Ref Range   Order Confirmation ORDER PROCESSED BY BLOOD BANK   Urinalysis, Routine w reflex microscopic     Status:  Abnormal   Collection Time: 09/01/14 10:48 PM  Result Value Ref Range   Color, Urine YELLOW YELLOW   APPearance CLEAR CLEAR   Specific Gravity, Urine 1.021 1.005 - 1.030   pH 6.0 5.0 - 8.0   Glucose, UA NEGATIVE NEGATIVE mg/dL   Hgb urine dipstick SMALL (A) NEGATIVE   Bilirubin Urine NEGATIVE NEGATIVE   Ketones, ur NEGATIVE NEGATIVE mg/dL   Protein, ur NEGATIVE NEGATIVE mg/dL   Urobilinogen, UA 1.0 0.0 - 1.0 mg/dL   Nitrite NEGATIVE NEGATIVE   Leukocytes, UA NEGATIVE NEGATIVE  Urine microscopic-add on     Status: None   Collection Time: 09/01/14 10:48 PM  Result Value Ref Range   Squamous Epithelial / LPF RARE RARE   WBC, UA 0-2 <3 WBC/hpf   RBC / HPF 0-2 <3 RBC/hpf   Bacteria, UA RARE RARE  Hemoglobin and hematocrit, blood     Status: Abnormal   Collection Time: 09/01/14 10:53 PM  Result Value Ref Range   Hemoglobin 5.1 (LL) 12.0 - 15.0 g/dL   HCT 15.5 (L) 36.0 - 46.0 %    UA no evidence of UTI  No results found for: HGBA1C  Estimated Creatinine Clearance: 70.8 mL/min (by C-G formula based on Cr of 0.56).  BNP (last 3 results) No results for input(s): PROBNP in the last 8760 hours.  Other results:  I have pearsonaly reviewed this: ECG REPORT  Rate: 132  Rhythm: Sinus tachycardia with PACs ST&T Change: ST depressions in the lateral leads minimal likely rate related QTC 434  Filed Weights   09/01/14 2148  Weight: 56.7 kg (125 lb)     Cultures:    Component Value Date/Time   SDES URINE, CLEAN CATCH 08/13/2014 2355   [place] Immunocompromised 08/13/2014 2355   CULT  08/13/2014 2355    INSIGNIFICANT GROWTH Performed at [place] 08/15/2014 FINAL 08/13/2014 2355     Radiological Exams on Admission: Dg Chest Port 1 View  09/01/2014   CLINICAL DATA:  Acute onset of fever. Current history of lung cancer, status post chemotherapy and radiation therapy. Right-sided chest pain. Initial encounter.  EXAM: PORTABLE CHEST - 1 VIEW   COMPARISON:  CT of the chest performed 08/03/2014, and chest radiograph performed 08/13/2014  FINDINGS: There appears to be increased necrosis involving the patient's right upper lobe lung mass, with underlying right upper lobe airspace opacification raising concern for postobstructive pneumonia given the patient's symptoms. The left lung appears clear. No pleural effusion or pneumothorax is seen.  The cardiomediastinal silhouette is normal in size. No acute osseous abnormalities are identified.  IMPRESSION: Increased necrosis involving the patient's right upper lobe lung  mass, status post chemotherapy and radiation therapy, with underlying right upper lobe airspace opacification raising concern for postobstructive pneumonia, given the patient's symptoms.   Electronically Signed   By: Garald Balding M.D.   On: 09/01/2014 22:09    Chart has been reviewed  Family  at  Bedside  plan of care was discussed with Mother Gaylene Brooks [phone]  Assessment/Plan  45 yo F with hx of squamous cell carcinoma currently undergoing chemo and radiation therapy presents with fever and cough with CXR worriosome for postobstructive Pneumonia. She was found to have hg of 5.1 also likely secondary to recent chemotherapy  Present on Admission:  . Sepsis she initially met sepsis criteria with tachycardia fever and increased respiratory rate. Most likely cells postobstructive pneumonia. We'll cover broadly given recent chemotherapy. Admit to step down for further monitoring  . Anemia - patient denies any melena or right red blood per rectum. She have had vaginal spotting for the past 3 weeks but no heavy bleeding. We will obtain anemia panel and transfuse 3 packs of red blood cells follow-up CBC closely admit to step down. We'll need to discuss with oncology in the morning . Non-small cell carcinoma of lung, stage 3  - patient is currently undergoing radiation therapy. This some evidence of necrosis. Likely  contributing to postobstructive pneumonia. We'll cover with Zosyn will obtain CT of the chest to evaluate father as well as to rule out any intrathoracic bleeding although this is less likely given no worsening shortness of breath no new oxygen requirement     Prophylaxis: SCD    CODE STATUS:  FULL CODE as per patient    Disposition:  To home once workup is complete and patient is stable  Other plan as per orders.  I have spent a total of 65 min on this admission   Espn Zeman 09/02/2014, 12:36 AM  [facility]  Pager [phone]   after 2 AM please page floor coverage PA If 7AM-7PM, please contact the day team taking care of the patient  [email or web address]  Password [email or web address]

## 2014-09-02 NOTE — Consult Note (Signed)
WOC wound consult note Reason for Consult: Moist cell desquamation from radiation thereapy treatments on anterior and posterior chest.  Posterior is >anterior. Wound type: thermal injury Pressure Ulcer POA: No Measurement:posterior chest measures 7cm x 3.5cm x 0.1cm.  Anterior chest measures 3cm x 2.5cm x 0.1cm Wound VVY:XAJL, moist Drainage (amount, consistency, odor) scant serous Periwound:intact, dry Dressing procedure/placement/frequency:I will cover with silicone foam dressings appropriate for the size of the area and instruct to change twice weekly until 2 weeks post healing. [facility] nursing team will not follow, but will remain available to this patient, the nursing and medical team.  Please re-consult if needed. Thanks, Maudie Flakes, MSN, RN, [place], [place], [place] [phone])

## 2014-09-02 NOTE — ED Notes (Signed)
Patient is on the way to the restroom I will collect labs when they return to room.

## 2014-09-03 ENCOUNTER — Ambulatory Visit: Payer: Self-pay | Admitting: Internal Medicine

## 2014-09-03 ENCOUNTER — Other Ambulatory Visit: Payer: Self-pay

## 2014-09-03 DIAGNOSIS — K1233 Oral mucositis (ulcerative) due to radiation: Secondary | ICD-10-CM | POA: Diagnosis present

## 2014-09-03 DIAGNOSIS — B370 Candidal stomatitis: Secondary | ICD-10-CM | POA: Diagnosis present

## 2014-09-03 DIAGNOSIS — C3490 Malignant neoplasm of unspecified part of unspecified bronchus or lung: Secondary | ICD-10-CM

## 2014-09-03 DIAGNOSIS — J188 Other pneumonia, unspecified organism: Secondary | ICD-10-CM

## 2014-09-03 LAB — CBC
HCT: 36.4 % (ref 36.0–46.0)
HEMOGLOBIN: 12.1 g/dL (ref 12.0–15.0)
MCH: 27.8 pg (ref 26.0–34.0)
MCHC: 33.2 g/dL (ref 30.0–36.0)
MCV: 83.7 fL (ref 78.0–100.0)
Platelets: 253 10*3/uL (ref 150–400)
RBC: 4.35 MIL/uL (ref 3.87–5.11)
RDW: 19.6 % — ABNORMAL HIGH (ref 11.5–15.5)
WBC: 7.7 10*3/uL (ref 4.0–10.5)

## 2014-09-03 LAB — BASIC METABOLIC PANEL
ANION GAP: 6 (ref 5–15)
BUN: 6 mg/dL (ref 6–20)
CO2: 23 mmol/L (ref 22–32)
Calcium: 8 mg/dL — ABNORMAL LOW (ref 8.9–10.3)
Chloride: 104 mmol/L (ref 101–111)
Creatinine, Ser: 0.58 mg/dL (ref 0.44–1.00)
GFR calc Af Amer: >60 mL/min (ref >60)
GFR calc non Af Amer: >60 mL/min (ref >60)
GLUCOSE: 92 mg/dL (ref 70–99)
Potassium: 3.4 mmol/L — ABNORMAL LOW (ref 3.5–5.1)
Sodium: 133 mmol/L — ABNORMAL LOW (ref 135–145)

## 2014-09-03 LAB — EXPECTORATED SPUTUM ASSESSMENT W REFEX TO RESP CULTURE

## 2014-09-03 LAB — LEGIONELLA ANTIGEN, URINE

## 2014-09-03 LAB — EXPECTORATED SPUTUM ASSESSMENT W GRAM STAIN, RFLX TO RESP C: Special Requests: NORMAL

## 2014-09-03 LAB — MAGNESIUM: Magnesium: 1.5 mg/dL — ABNORMAL LOW (ref 1.7–2.4)

## 2014-09-03 MED ORDER — LEVOFLOXACIN 500 MG PO TABS
500.0000 mg | ORAL_TABLET | Freq: Every day | ORAL | Status: DC
  Administered 2014-09-04 – 2014-09-06 (×3): 500 mg via ORAL
  Filled 2014-09-03 (×3): qty 1

## 2014-09-03 MED ORDER — MAGNESIUM SULFATE 50 % IJ SOLN
3.0000 g | Freq: Once | INTRAVENOUS | Status: DC
  Filled 2014-09-03: qty 6

## 2014-09-03 MED ORDER — FLUCONAZOLE 40 MG/ML PO SUSR
100.0000 mg | Freq: Every day | ORAL | Status: DC
  Administered 2014-09-04 – 2014-09-06 (×3): 100 mg via ORAL
  Filled 2014-09-03 (×4): qty 2.5

## 2014-09-03 MED ORDER — MAGIC MOUTHWASH W/LIDOCAINE
15.0000 mL | Freq: Three times a day (TID) | ORAL | Status: DC
  Administered 2014-09-03: 5 mL via ORAL
  Administered 2014-09-03 – 2014-09-06 (×8): 15 mL via ORAL
  Filled 2014-09-03 (×15): qty 15

## 2014-09-03 MED ORDER — ONDANSETRON HCL 4 MG/2ML IJ SOLN
INTRAMUSCULAR | Status: AC
  Filled 2014-09-03: qty 2

## 2014-09-03 MED ORDER — POTASSIUM CHLORIDE 20 MEQ PO PACK
40.0000 meq | PACK | Freq: Once | ORAL | Status: DC
  Filled 2014-09-03: qty 2

## 2014-09-03 MED ORDER — METOPROLOL SUCCINATE 12.5 MG HALF TABLET
12.5000 mg | ORAL_TABLET | Freq: Two times a day (BID) | ORAL | Status: DC
  Administered 2014-09-03: 13:00:00 via ORAL
  Administered 2014-09-03 – 2014-09-05 (×5): 12.5 mg via ORAL
  Filled 2014-09-03 (×8): qty 1

## 2014-09-03 MED ORDER — POTASSIUM CHLORIDE 20 MEQ/15ML (10%) PO SOLN
40.0000 meq | Freq: Once | ORAL | Status: AC
  Administered 2014-09-03: 40 meq via ORAL
  Filled 2014-09-03: qty 30

## 2014-09-03 MED ORDER — DEXTROSE 5 % IV SOLN
500.0000 mg | INTRAVENOUS | Status: DC
  Administered 2014-09-03: 500 mg via INTRAVENOUS
  Filled 2014-09-03: qty 500

## 2014-09-03 MED ORDER — OXYCODONE-ACETAMINOPHEN 5-325 MG PO TABS
1.0000 | ORAL_TABLET | Freq: Four times a day (QID) | ORAL | Status: DC | PRN
  Administered 2014-09-03 – 2014-09-06 (×12): 2 via ORAL
  Filled 2014-09-03 (×12): qty 2

## 2014-09-03 MED ORDER — FLUCONAZOLE IN SODIUM CHLORIDE 200-0.9 MG/100ML-% IV SOLN
200.0000 mg | Freq: Once | INTRAVENOUS | Status: AC
  Administered 2014-09-03: 200 mg via INTRAVENOUS
  Filled 2014-09-03: qty 100

## 2014-09-03 MED ORDER — POTASSIUM CHLORIDE CRYS ER 20 MEQ PO TBCR: 40.0000 meq | EXTENDED_RELEASE_TABLET | Freq: Once | ORAL | Status: DC

## 2014-09-03 MED ORDER — POTASSIUM CHLORIDE CRYS ER 20 MEQ PO TBCR
40.0000 meq | EXTENDED_RELEASE_TABLET | Freq: Once | ORAL | Status: AC
  Administered 2014-09-03: 40 meq via ORAL
  Filled 2014-09-03: qty 2

## 2014-09-03 MED ORDER — POTASSIUM CHLORIDE CRYS ER 20 MEQ PO TBCR
40.0000 meq | EXTENDED_RELEASE_TABLET | Freq: Once | ORAL | Status: DC
  Filled 2014-09-03: qty 2

## 2014-09-03 MED ORDER — ONDANSETRON HCL 4 MG/2ML IJ SOLN
4.0000 mg | Freq: Once | INTRAMUSCULAR | Status: AC
  Administered 2014-09-03: 4 mg via INTRAVENOUS

## 2014-09-03 MED ORDER — SODIUM CHLORIDE 0.9 % IV SOLN
125.0000 mg | Freq: Once | INTRAVENOUS | Status: AC
  Administered 2014-09-03: 125 mg via INTRAVENOUS
  Filled 2014-09-03: qty 10

## 2014-09-03 NOTE — Consult Note (Signed)
[facility] for Infectious Disease     Reason for Consult: post obstructive pneumonia with CT findings of increased cavitation and patchy airspace opacification    Referring Physician: Dr. Grandville Silos  Principal Problem:   Sepsis Active Problems:   Lobar pneumonia: CAP   Anemia   Non-small cell carcinoma of lung, stage 3   Symptomatic anemia   Postobstructive pneumonia   Thrush, oral   Mucositis due to radiation therapy: Probable   . docusate sodium  100 mg Oral Daily  . [START ON 09/04/2014] fluconazole  100 mg Oral Daily  . iron polysaccharides  150 mg Oral Daily  . levofloxacin  500 mg Oral Daily  . magic mouthwash w/lidocaine  15 mL Oral TID  . magnesium sulfate 1 - 4 g bolus IVPB  3 g Intravenous Once  . metoprolol succinate  12.5 mg Oral BID  . ondansetron      . pantoprazole  40 mg Oral Q0600  . PRUTECT  1 application Topical Daily  . sucralfate  1 g Oral TID WC & HS    Recommendations: I will change to levaquin to cover broadly and is orally available  Does she need protonix?  Not on home meds PTA, increases C diff risk Will add fungal sputum culture to his collection    Assessment: She has post obstructive pneumonia and seems to be responding to treatment.  Also noted on CT is worsening cavitation and patchy airspace opacification.  Cavitation likely secondary to lung cancer, less likely fungal infection though certainly possible and will need radiographic follow up in about 3-4 weeks.  Patchy airspace opacification is non specific and can be with viral infections, PCP (HIV neg in February) or other atypical infections so will use levaquin to cover this possibility. Is not MRSA colonized so will stop vancomycin.     Antibiotics: Vancomycin, zosyn and azithromycin  HPI: Barbara Frost is a 44 y.o. female with squamous cell lung cancer  Diagnosed this Feb 2016 with RUL mass treated with carboplatin and paclitaxel and radiation who presented to ED 5/1 with fever to  102, cough with productive sputum and chills with CXR and CT of chest with worsening cavitation and patchy opacities.  She has been afebrile over 24 hours and seems to have rapidly improved, feeling much better now.  No positive cultures with sputum culture just now being sent off.  Blood cultures remain ngtd.     Review of Systems: A comprehensive review of systems was negative.  Past Medical History  Diagnosis Date  . Squamous cell carcinoma of right lung 06/29/14    History  Substance Use Topics  . Smoking status: Former Smoker -- 1.00 packs/day for 30 years    Types: Cigarettes    Quit date: 06/26/2014  . Smokeless tobacco: Never Used  . Alcohol Use: No     Comment: socially    Family History  Problem Relation Age of Onset  . Heart attack Mother   . Diabetes Mother   . Heart attack Father    Allergies  Allergen Reactions  . Keflex [Cephalexin] Rash    OBJECTIVE: Blood pressure 154/107, pulse 91, temperature 98.3 F (36.8 C), temperature source Oral, resp. rate 21, height 5' (1.524 m), weight 136 lb 14.5 oz (62.1 kg), last menstrual period 08/05/2014, SpO2 96 %. General: awake, alert, nad Skin: no rashes Lungs: some mild congestion but no wheezes Cor: RRR Abdomen: soft, nt, nd Ext; no edema  Microbiology: Recent Results (from the past  240 hour(s))  Blood Culture (routine x 2)     Status: None (Preliminary result)   Collection Time: 09/01/14  9:40 PM  Result Value Ref Range Status   Specimen Description BLOOD LEFT ANTECUBITAL  Final   Special Requests BOTTLES DRAWN AEROBIC AND ANAEROBIC 5CC  Final   Culture   Final           BLOOD CULTURE RECEIVED NO GROWTH TO DATE CULTURE WILL BE HELD FOR 5 DAYS BEFORE ISSUING A FINAL NEGATIVE REPORT Performed at [facility]    Report Status PENDING  Incomplete  Blood Culture (routine x 2)     Status: None (Preliminary result)   Collection Time: 09/01/14  9:45 PM  Result Value Ref Range Status   Specimen Description  BLOOD LEFT HAND  Final   Special Requests BOTTLES DRAWN AEROBIC AND ANAEROBIC 5CC  Final   Culture   Final           BLOOD CULTURE RECEIVED NO GROWTH TO DATE CULTURE WILL BE HELD FOR 5 DAYS BEFORE ISSUING A FINAL NEGATIVE REPORT Performed at [facility]    Report Status PENDING  Incomplete  Urine culture     Status: None   Collection Time: 09/01/14 10:48 PM  Result Value Ref Range Status   Specimen Description URINE, RANDOM  Final   Special Requests NONE  Final   Colony Count NO GROWTH Performed at [facility]   Final   Culture NO GROWTH Performed at [facility]   Final   Report Status 09/02/2014 FINAL  Final  MRSA PCR Screening     Status: None   Collection Time: 09/02/14  8:33 AM  Result Value Ref Range Status   MRSA by PCR NEGATIVE NEGATIVE Final    Comment:        The GeneXpert MRSA Assay (FDA approved for NASAL specimens only), is one component of a comprehensive MRSA colonization surveillance program. It is not intended to diagnose MRSA infection nor to guide or monitor treatment for MRSA infections.     Scharlene Gloss, [facility] Infectious Disease [place] [email or web address] [phone] pager  [phone] cell 09/03/2014, 5:20 PM

## 2014-09-03 NOTE — Progress Notes (Signed)
Pharmacy Consult Note - IV Iron Replacement  52 yoF with [place] Lung Ca Stage 3 admitted 4/30 with fever. S/p chemotherapy with carboplatin and paclitaxel 4/18.  Hgb on admission = 5.1, transfused a total of 3 units PRBCs, now stable at 12.1.  Serum iron = 15 (L) TIBC = 133 (L) Sat ratio = 11 Ferritin = 413 (H) Folate = 12.4 B12 = 871  Pharmacy is now consulted to dose IV iron.  Plan:  Ferrlecit 125 mg IV x 1 on 5/2,  May be repeated q 2 to 3 days for up to 8 doses total if needed.

## 2014-09-03 NOTE — Care Management Note (Signed)
Case Management Note  Patient Details  Name: Barbara Frost MRN: [id number] Date of Birth: 1970-12-14  Subjective/Objective:         Sepsis  And pna           Action/Plan:  Home    Expected Discharge Date:  09/07/14               Expected Discharge Plan:  Home/Self Care  In-House Referral:  Financial Counselor  Discharge planning Services  CM Consult  Post Acute Care Choice:  NA Choice offered to:  NA  DME Arranged:    DME Agency:     HH Arranged:    [place] Agency:     Status of Service:     Medicare Important Message Given:    Date Medicare IM Given:    Medicare IM give by:    Date Additional Medicare IM Given:    Additional Medicare Important Message give by:     If discussed at [place] of Stay Meetings, dates discussed:    Additional Comments:  Leeroy Cha, RN 09/03/2014, 12:40 PM

## 2014-09-03 NOTE — Progress Notes (Signed)
[facility] PROGRESS NOTE  Barbara Frost XVQ:[id number] DOB: 1971-02-09 DOA: 09/01/2014 PCP: [name], MD  Assessment/Plan: #1 sepsis Secondary to postobstructive pneumonia noted on CT chest. Findings noted on CT chest with concerns for possible underlying fungal infection and possible atypical infection. Patient with clinical improvement. Urinary strep pneumococcus antigen is negative. Influenza PCR is negative. Urine Legionella antigen is pending. Blood cultures are pending. Patient is afebrile over the past 24 hours. WBC is normal. Continue empiric IV vancomycin and IV Zosyn. Will add IV azithromycin. Follow.  #2 postobstructive pneumonia CT chest with concerns for possible underlying fungal infection and possible atypical infection noted. Patient on chemotherapy with recent diagnosis of lung cancer. Patient is currently afebrile over the past 24 hours. WBCs normalized. Patient has been pancultured and results are pending. Continue empiric IV vancomycin, IV Zosyn. Will add IV azithromycin. Patient will be started on Diflucan secondary to oral thrush. Will consult with ID for further evaluation and management.  #3 hypokalemia Magnesium level is 1.5. Keep magnesium greater than 2. Replace.  #4 symptomatic anemia Likely secondary to chemotherapy. Patient is status post 3 units packed red blood cells. Anemia panel consistent with iron deficiency anemia and AOCD with a iron level of 15 and a ferritin level of 413. Hemoglobin currently stable. Will give some IV iron. Will likely benefit from oral iron on discharge. Follow.  #5 non-small cell lung cancer stage III Oncology has been notified of admission.  #6 thermal injury Patient with some skin discrimination on anterior and posterior chest secondary to radiation treatments. Patient has been seen by wound care nurse and dressing changes have been recommended.  #7 oral thrush Place on Diflucan.  #8 dysphagia/probable mucositis Patient  with complaints of swallowing feels is secondary to radiation therapy. Patient also noted to have oral thrush. We'll place on Magic mouthwash with lidocaine, Diflucan. Continue Carafate.  #9 prophylaxis Pepcid for GI prophylaxis. SCDs for DVT prophylaxis.  Code Status: Full Family Communication: Updated patient and mother at bedside. Disposition Plan: Transfer to [place] floor.   Consultants:  None  Procedures:  Chest x-ray 09/01/2014  CT angiogram of chest 09/02/2014  Antibiotics:  IV Zosyn 09/01/2014  IV vancomycin 09/01/2014  IV azithromycin 09/03/2014  HPI/Subjective: Patient states some improvement in shortness of breath. Patient with complaints on swallowing feels secondary to her radiation therapy.  Objective: Filed Vitals:   09/03/14 1136  BP:   Pulse:   Temp: 98.1 F (36.7 C)  Resp:     Intake/Output Summary (Last 24 hours) at 09/03/14 1156 Last data filed at 09/03/14 1023  Gross per 24 hour  Intake  757.5 ml  Output    600 ml  Net  157.5 ml   Filed Weights   09/01/14 2148 09/02/14 0308  Weight: 56.7 kg (125 lb) 62.1 kg (136 lb 14.5 oz)    Exam:   General:  NAD. White exudates noted on upper palate of oral mucosa.  Cardiovascular: RRR. Mepilex on anterior chest wall. Mepilex on right scapular region.  Respiratory: CTAB. No wheezing, no crackles.  Abdomen: Soft, nontender, nondistended, positive bowel sounds.  Musculoskeletal: No clubbing cyanosis or edema.  Data Reviewed: Basic Metabolic Panel:  Recent Labs Lab 09/01/14 2148 09/02/14 0906 09/03/14 0407  NA 141 137 133*  K 3.0* 3.4* 3.4*  CL 108 109 104  CO2 '26 23 23  '$ GLUCOSE 117* 86 92  BUN '11 6 6  '$ CREATININE 0.56 0.49 0.58  CALCIUM 8.3* 7.8* 8.0*  MG  --   --  1.5*   Liver Function Tests:  Recent Labs Lab 09/01/14 2148 09/02/14 0906  AST 17 11*  ALT 15 11*  ALKPHOS 72 58  BILITOT 0.5 1.2  PROT 6.2 5.6*  ALBUMIN 2.6* 2.2*   No results for input(s): LIPASE,  AMYLASE in the last 168 hours. No results for input(s): AMMONIA in the last 168 hours. CBC:  Recent Labs Lab 09/01/14 2148 09/01/14 2253 09/02/14 1508 09/03/14 0407  WBC 9.4  --  8.5 7.7  NEUTROABS 7.0  --   --   --   HGB 3.0* 5.1* 12.0 12.1  HCT 9.1* 15.5* 36.1 36.4  MCV 87.5  --  84.3 83.7  PLT 376  --  224 253   Cardiac Enzymes: No results for input(s): CKTOTAL, CKMB, CKMBINDEX, TROPONINI in the last 168 hours. BNP (last 3 results) No results for input(s): BNP in the last 8760 hours.  ProBNP (last 3 results) No results for input(s): PROBNP in the last 8760 hours.  CBG: No results for input(s): GLUCAP in the last 168 hours.  Recent Results (from the past 240 hour(s))  Blood Culture (routine x 2)     Status: None (Preliminary result)   Collection Time: 09/01/14  9:40 PM  Result Value Ref Range Status   Specimen Description BLOOD LEFT ANTECUBITAL  Final   Special Requests BOTTLES DRAWN AEROBIC AND ANAEROBIC 5CC  Final   Culture   Final           BLOOD CULTURE RECEIVED NO GROWTH TO DATE CULTURE WILL BE HELD FOR 5 DAYS BEFORE ISSUING A FINAL NEGATIVE REPORT Performed at [facility]    Report Status PENDING  Incomplete  Blood Culture (routine x 2)     Status: None (Preliminary result)   Collection Time: 09/01/14  9:45 PM  Result Value Ref Range Status   Specimen Description BLOOD LEFT HAND  Final   Special Requests BOTTLES DRAWN AEROBIC AND ANAEROBIC 5CC  Final   Culture   Final           BLOOD CULTURE RECEIVED NO GROWTH TO DATE CULTURE WILL BE HELD FOR 5 DAYS BEFORE ISSUING A FINAL NEGATIVE REPORT Performed at [facility]    Report Status PENDING  Incomplete  Urine culture     Status: None   Collection Time: 09/01/14 10:48 PM  Result Value Ref Range Status   Specimen Description URINE, RANDOM  Final   Special Requests NONE  Final   Colony Count NO GROWTH Performed at [facility]   Final   Culture NO GROWTH Performed at [facility]   Final   Report Status 09/02/2014 FINAL  Final  MRSA PCR Screening     Status: None   Collection Time: 09/02/14  8:33 AM  Result Value Ref Range Status   MRSA by PCR NEGATIVE NEGATIVE Final    Comment:        The GeneXpert MRSA Assay (FDA approved for NASAL specimens only), is one component of a comprehensive MRSA colonization surveillance program. It is not intended to diagnose MRSA infection nor to guide or monitor treatment for MRSA infections.      Studies: Ct Angio Chest Pe W/cm &/or Wo Cm  09/02/2014   CLINICAL DATA:  Known squamous cell carcinoma, status post chemotherapy and radiation therapy. Fever, chest pain and cough. Initial encounter.  EXAM: CT ANGIOGRAPHY CHEST WITH CONTRAST  TECHNIQUE: Multidetector CT imaging of the chest was performed using the standard protocol during bolus administration  of intravenous contrast. Multiplanar CT image reconstructions and MIPs were obtained to evaluate the vascular anatomy.  CONTRAST:  118m OMNIPAQUE IOHEXOL 350 MG/ML SOLN  COMPARISON:  CTA of the chest performed 08/14/2014, and chest radiograph performed 09/01/2014  FINDINGS: There is no evidence of pulmonary embolus.  The patient's large right upper lobe mass is relatively stable in size, measuring approximately 7.8 cm. There is significantly increased cavitation, and underlying fungal infection cannot be entirely excluded. New patchy airspace opacification within the periphery of the right upper lobe raises concern for superimposed infection, possibly atypical in nature.  Additional mild patchy opacity is noted within the central portions of the lungs bilaterally, concerning for pneumonia. Mild interstitial prominence is seen. Emphysematous change is noted bilaterally, more prominent at the left upper lobe. No pleural effusion or pneumothorax is seen.  A 1.7 cm right hilar node is seen. No mediastinal lymphadenopathy is appreciated. No pericardial effusion is identified. The great  vessels are grossly unremarkable in appearance. No axillary lymphadenopathy is seen. The visualized portions of the thyroid gland are unremarkable in appearance.  A tiny hiatal hernia is noted.  The visualized portions of the liver and spleen are unremarkable. The visualized portions of the pancreas, gallbladder, stomach, adrenal glands and kidneys are within normal limits.  No acute osseous abnormalities are seen.  Review of the MIP images confirms the above findings.  IMPRESSION: 1. No evidence of pulmonary embolus. 2. Large right upper lobe lung mass is relatively stable in size, measuring 7.8 cm. Significantly increased cavitation. Given its appearance, underlying fungal infection cannot be entirely excluded. 3. New patchy airspace opacification in the periphery of the right upper lobe raises concern for superimposed infection, possibly atypical in nature. Additional mild patchy opacity noted in the central portions of the lungs bilaterally, also concerning for pneumonia. 4. Mild interstitial prominence seen. Mild emphysematous change noted bilaterally, more prominent at the left upper lobe. 5. 1.7 cm right hilar node is concerning for nodal metastasis, similar in appearance to the prior study. 6. Tiny hiatal hernia seen.   Electronically Signed   By: JGarald BaldingM.D.   On: 09/02/2014 02:32   Dg Chest Port 1 View  09/01/2014   CLINICAL DATA:  Acute onset of fever. Current history of lung cancer, status post chemotherapy and radiation therapy. Right-sided chest pain. Initial encounter.  EXAM: PORTABLE CHEST - 1 VIEW  COMPARISON:  CT of the chest performed 08/03/2014, and chest radiograph performed 08/13/2014  FINDINGS: There appears to be increased necrosis involving the patient's right upper lobe lung mass, with underlying right upper lobe airspace opacification raising concern for postobstructive pneumonia given the patient's symptoms. The left lung appears clear. No pleural effusion or pneumothorax is  seen.  The cardiomediastinal silhouette is normal in size. No acute osseous abnormalities are identified.  IMPRESSION: Increased necrosis involving the patient's right upper lobe lung mass, status post chemotherapy and radiation therapy, with underlying right upper lobe airspace opacification raising concern for postobstructive pneumonia, given the patient's symptoms.   Electronically Signed   By: JGarald BaldingM.D.   On: 09/01/2014 22:09    Scheduled Meds: . azithromycin  500 mg Intravenous Q24H  . docusate sodium  100 mg Oral Daily  . [START ON 09/04/2014] fluconazole  100 mg Oral Daily  . fluconazole (DIFLUCAN) IV  200 mg Intravenous Once  . iron polysaccharides  150 mg Oral Daily  . magic mouthwash w/lidocaine  15 mL Oral TID  . magnesium sulfate 1 - 4 g  bolus IVPB  3 g Intravenous Once  . metoprolol succinate  12.5 mg Oral BID  . ondansetron (ZOFRAN) IV  4 mg Intravenous Once  . pantoprazole  40 mg Oral Q0600  . piperacillin-tazobactam (ZOSYN)  IV  3.375 g Intravenous Q8H  . PRUTECT  1 application Topical Daily  . sucralfate  1 g Oral TID WC & HS  . vancomycin  750 mg Intravenous Q12H   Continuous Infusions:   Principal Problem:   Sepsis Active Problems:   Lobar pneumonia: CAP   Anemia   Non-small cell carcinoma of lung, stage 3   Symptomatic anemia   Postobstructive pneumonia   Thrush, oral   Mucositis due to radiation therapy: Probable    Time spent: 40 mins    [facility] MD [facility] Pager [phone]. If 7PM-7AM, please contact night-coverage at [email or web address], password [facility] 09/03/2014, 11:56 AM  LOS: 1 day

## 2014-09-03 NOTE — Progress Notes (Signed)
Sep 03, 2014/Rhonda L. Rosana Hoes, RN, BSN, CCM. Case Management [place] [phone] No discharge needs present of time of review.

## 2014-09-04 DIAGNOSIS — A419 Sepsis, unspecified organism: Principal | ICD-10-CM

## 2014-09-04 DIAGNOSIS — D649 Anemia, unspecified: Secondary | ICD-10-CM

## 2014-09-04 DIAGNOSIS — J181 Lobar pneumonia, unspecified organism: Secondary | ICD-10-CM

## 2014-09-04 DIAGNOSIS — C3411 Malignant neoplasm of upper lobe, right bronchus or lung: Secondary | ICD-10-CM

## 2014-09-04 DIAGNOSIS — J9601 Acute respiratory failure with hypoxia: Secondary | ICD-10-CM

## 2014-09-04 LAB — CBC
HCT: 38.2 % (ref 36.0–46.0)
HEMOGLOBIN: 12.4 g/dL (ref 12.0–15.0)
MCH: 28 pg (ref 26.0–34.0)
MCHC: 32.5 g/dL (ref 30.0–36.0)
MCV: 86.2 fL (ref 78.0–100.0)
Platelets: 251 10*3/uL (ref 150–400)
RBC: 4.43 MIL/uL (ref 3.87–5.11)
RDW: 19.9 % — ABNORMAL HIGH (ref 11.5–15.5)
WBC: 6.8 10*3/uL (ref 4.0–10.5)

## 2014-09-04 LAB — BASIC METABOLIC PANEL
ANION GAP: 5 (ref 5–15)
BUN: 8 mg/dL (ref 6–20)
CHLORIDE: 104 mmol/L (ref 101–111)
CO2: 27 mmol/L (ref 22–32)
Calcium: 8.1 mg/dL — ABNORMAL LOW (ref 8.9–10.3)
Creatinine, Ser: 0.69 mg/dL (ref 0.44–1.00)
GFR calc Af Amer: >60 mL/min (ref >60)
GFR calc non Af Amer: >60 mL/min (ref >60)
Glucose, Bld: 118 mg/dL — ABNORMAL HIGH (ref 70–99)
POTASSIUM: 4 mmol/L (ref 3.5–5.1)
SODIUM: 136 mmol/L (ref 135–145)

## 2014-09-04 LAB — MAGNESIUM: MAGNESIUM: 2.2 mg/dL (ref 1.7–2.4)

## 2014-09-04 MED ORDER — ASPIRIN EC 81 MG PO TBEC
162.0000 mg | DELAYED_RELEASE_TABLET | Freq: Four times a day (QID) | ORAL | Status: DC | PRN
  Filled 2014-09-04: qty 2

## 2014-09-04 MED ORDER — ASPIRIN EC 81 MG PO TBEC
81.0000 mg | DELAYED_RELEASE_TABLET | Freq: Four times a day (QID) | ORAL | Status: DC | PRN
  Filled 2014-09-04: qty 1

## 2014-09-04 NOTE — Progress Notes (Signed)
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 Denies vision changes, or mucositis. Denies lower extremity swelling. Denies nausea, heartburn or change in bowel habits.  Denies any dysuria. Denies neuropathy. Denies any bleeding issues such as epistaxis, hematemesis, hematuria or hematochezia.  At the ED she was febrile, tachycardic, without desaturating. Chest X ray was suspicious for post obstructive pneumonia requiring IV antibiotics. CT angio on 5/1 was negative for PE, with stable RUL mass at 7.8 cm, along with stable nodal mets at the right hilar area,  but significantly increased cavitation and possible post obstructive pneumonia was seen.  She is currently being managed by the Hospitalist team. She is feeling better. We were kindly informed of the patient's admission  DIAGNOSIS:  stage IIIa (T3, N2, M0) non-small cell lung cancer, squamous cell carcinoma presented with large right upper lobe mass with questionable mediastinal invasion diagnosed in February 2016.  CURRENT THERAPY: Concurrent chemoradiation with chemotherapy in the form of weekly carboplatin for an AUC of 2 and paclitaxel 45 mg/m given concurrent with radiation therapy. Status post 6 weeks of treatment She completed radiation on 08/20/14   Scheduled Meds: . docusate sodium  100 mg Oral Daily  . fluconazole  100 mg Oral Daily  . iron polysaccharides  150 mg Oral Daily  . levofloxacin  500 mg Oral Daily  . magic mouthwash w/lidocaine  15 mL Oral TID  . magnesium sulfate 1 - 4 g  bolus IVPB  3 g Intravenous Once  . metoprolol succinate  12.5 mg Oral BID  . pantoprazole  40 mg Oral Q0600  . PRUTECT  1 application Topical Daily  . sucralfate  1 g Oral TID WC & HS   Continuous Infusions:  PRN Meds:acetaminophen, albuterol, oxyCODONE-acetaminophen   Objective:  Filed Vitals:   09/04/14 0442  BP: 118/79  Pulse: 92  Temp: 99.1 F (37.3 C)  Resp: 20      Intake/Output Summary (Last 24 hours) at 09/04/14 8841 Last data filed at 09/03/14 2300  Gross per 24 hour  Intake   1266 ml  Output      0 ml  Net   1266 ml    GENERAL:alert, no distress and comfortable SKIN: skin color, texture, turgor are normal, except some radiation changes in her right posterior and anterior chest noted  EYES: normal, conjunctiva are pink and non-injected, sclera clear OROPHARYNX:no exudate, no erythema and lips, buccal mucosa, and tongue normal  NECK: supple, thyroid normal size, non-tender, without nodularity LYMPH:  no palpable lymphadenopathy in the cervical, axillary or inguinal LUNGS:coarse breath sounds with rhonchi, no rales or wheezing, with normal breathing effort HEART: regular rate & rhythm and no murmurs and no lower extremity edema ABDOMEN: soft, non-tender and normal bowel sounds Musculoskeletal:no cyanosis of digits and no clubbing  PSYCH: alert & oriented x 3 with fluent speech NEURO: no focal motor/sensory deficits    CBG (last 3)  No results for input(s): GLUCAP in the last 72 hours.   Labs:   Recent Labs Lab 09/01/14 2148 09/01/14 2253 09/02/14 1508 09/03/14 0407 09/04/14  0353  WBC 9.4  --  8.5 7.7 6.8  HGB 3.0* 5.1* 12.0 12.1 12.4  HCT 9.1* 15.5* 36.1 36.4 38.2  PLT 376  --  224 253 251  MCV 87.5  --  84.3 83.7 86.2  MCH 28.8  --  28.0 27.8 28.0  MCHC 33.0  --  33.2 33.2 32.5  RDW 23.6*  --  19.6* 19.6* 19.9*  LYMPHSABS 1.1  --   --   --   --   MONOABS 1.3*  --   --   --   --   EOSABS 0.0  --   --   --   --   BASOSABS 0.0  --   --   --   --       Chemistries:    Recent Labs Lab 09/01/14 2148 09/02/14 0906 09/03/14 0407 09/04/14 0353  NA 141 137 133* 136  K 3.0* 3.4* 3.4* 4.0  CL 108 109 104 104  CO2 '26 23 23 27  '$ GLUCOSE 117* 86 92 118*  BUN '11 6 6 8  '$ CREATININE 0.56 0.49 0.58 0.69  CALCIUM 8.3* 7.8* 8.0* 8.1*  MG  --   --  1.5* 2.2  AST 17 11*  --   --   ALT 15 11*  --   --   ALKPHOS 72 58  --   --   BILITOT 0.5 1.2  --   --     GFR Estimated Creatinine Clearance: 73.8 mL/min (by C-G formula based on Cr of 0.69).  Liver Function Tests:  Recent Labs Lab 09/01/14 2148 09/02/14 0906  AST 17 11*  ALT 15 11*  ALKPHOS 72 58  BILITOT 0.5 1.2  PROT 6.2 5.6*  ALBUMIN 2.6* 2.2*   Urine Studies     Component Value Date/Time   COLORURINE YELLOW 09/01/2014 2248   APPEARANCEUR CLEAR 09/01/2014 2248   LABSPEC 1.021 09/01/2014 2248   PHURINE 6.0 09/01/2014 2248   GLUCOSEU NEGATIVE 09/01/2014 2248   HGBUR SMALL* 09/01/2014 2248   BILIRUBINUR NEGATIVE 09/01/2014 2248   KETONESUR NEGATIVE 09/01/2014 2248   PROTEINUR NEGATIVE 09/01/2014 2248   UROBILINOGEN 1.0 09/01/2014 2248   NITRITE NEGATIVE 09/01/2014 2248   LEUKOCYTESUR NEGATIVE 09/01/2014 2248    Coagulation profile  Recent Labs Lab 09/02/14 0001  INR 1.10    Microbiology Cultures negative   Imaging Studies:  No results found.  Assessment/Plan This is a pleasant 44 year old female with  Stage IIIa (T3, N2, M0) non-small cell lung cancer, squamous cell carcinoma  presented with large right upper lobe mass with questionable mediastinal invasion diagnosed in February 2016.  She is currently receiving concurrent chemoradiation therapy. Her chemotherapy consisted of carboplatin for an AUC of 2 and Taxol at 45 mg/m given weekly concurrent with radiation. She received last cycle on 08/20/14  Acute hypoxic respiratory failure secondary to lobar pneumonia Sepsis secondary to lobar pneumonia, CAP CT angio on 5/1 was negative for PE but  significantly increased cavitation and possible post obstructive pneumonia was seen.  She received IV antibiotics, IV nebs, Oxygen as per primary team Cultures are negative to date Appreciate hospitalist follow up  Anemia Due to recent chemotherapy sepsis, antibiotics. She received 3 units of blood with good response Current Hb is 12.4 No transfusion is indicated at this time Monitor counts closely Transfuse blood to maintain a Hb of 8 g or if the patient is acutely bleeding  DVT prophylaxis On SCDs  Full Code   Other medical issues as  per admitting team     **Disclaimer: This note was dictated with voice recognition software. Similar sounding words can inadvertently be transcribed and this note may contain transcription errors which may not have been corrected upon publication of note.** Rondel Jumbo, PA-C 09/04/2014  8:33 AM  ADDENDUM: Hematology/Oncology Attending: The patient is seen and examined today. She was admitted with severe anemia after having menstrual period for over 3 weeks. Her last CBC after completion of the chemotherapy on 08/20/2014 showed hemoglobin of 12.0 and hematocrit 37.6%. When the patient presented to the emergency department on 09/01/2014 her hemoglobin was down to 3.0 and hematocrit 9.1%. The patient received 3 units of PRBCs transfusion and repeat CBC on 09/02/2014 showed improvement of her hemoglobin to 12.0 and hematocrit 36.1%. During her evaluation the patient had CT angiogram of the chest which showed no evidence for pulmonary embolism but there was the large right upper lobe lung mass relatively stable in size and measuring 7.8 cm with significantly increased cavitation. There was also new patchy airspace opacification in the periphery of the right upper lobe is concerning for superimposed infection. The patient is treated for pneumonia with vancomycin, Zosyn and azithromycin. The patient is feeling much better today and she is expected to be  discharged from the hospital tomorrow. She denied having any fever or chills. I will arrange for the patient to come back for follow-up visit in a few weeks at the [facility] for reevaluation and repeat imaging studies for the final restaging of her disease. She was advised to call if she has any concerning symptoms in the interval. Thank you for taking good care of Ms. Crumby, I will continue to follow up the patient with you and assist in her management an as-needed basis

## 2014-09-04 NOTE — Progress Notes (Signed)
Patient ID: Barbara Frost, female   DOB: [birth year], 44 y.o.   MRN: [id number]  [facility] PROGRESS NOTE  Barbara Frost XYB:[id number] DOB: 1970/09/13 DOA: 09/01/2014 PCP: Barbara Merino, MD   Brief narrative:    Patient is 44 year old female with known squamous cell carcinoma of the right lung diagnosed 06/29/2014, follows with Dr. Julien Nordmann, has been getting radiation therapy by Dr. Valere Dross as well as chemotherapy with carboplatin and paclitaxel. Patient presented to [facility] emergency department with main concern of several days duration of progressively worsening dyspnea, initially noted with exertion and has progressed to dyspnea at rest over the past 24 hours, associated with intermittent mixed episodes of nonproductive and productive cough of clear to yellow sputum, subjective fevers and chills, malaise, poor oral intake. Patient also reports associated chest discomfort with coughing spells. She denies any specific abdominal concerns, no specific urinary concerns. Patient reports progressive malaise, has been mostly bed bound over the past several days.   In emergency department, patient noted to be hemodynamically stable, vital signs notable for T102.74F, HR 140, RR 29, BP 100/77, oxygen saturation 90% on room air. Blood work notable for hg 3 and repeat CBC with Hg 5.1. 3 units of PRBC requested for transfusion. Chest x-ray concerning for possible postobstructive pneumonia. [facility] asked to admit to step down unit for further evaluation and management  Assessment/Plan:    Active Problems:   Sepsis secondary to lobar pneumonia, CAP, post obstructive PNA - Criteria for sepsis met on admission with T102.74F, HR 140, RR 29, source pneumonia - Patient started on broad spectrum antibiotics vancomycin and Zosyn given immunocompromise state - pt continues to improve clinically  - strep pneumo negative, influenza panel negative  - blood culture with no growth to date - per ID, ABX changed to Levaquin  and recommendation is to continue for two more weeks, stop date may 16th, 2016 - per ID, recommendation is to repeat CT chest in 3-4 weeks post d/c, if cavitation persists, will need PCCM input   Acute hypoxic respiratory failure secondary to lobar pneumonia - Antibiotics as noted above, oxygen via nasal canula is sufficient for now   Hypokalemia - supplemented and WNL this AM - Mg was also low and has been supplemented  - will repeat BMP In AM   Dysphagia/probable mucositis/oral thrush  - secondary to radiation therapy  - continue magic mouthwash with lidocaine, Diflucan. carafate.   Acute on chronic blood loss anemia, IDA  - Possibly related to chemotherapy - 3 units of PRBC transfused, Hg remains stable at ~12 with no signs of active bleeding - Anemia panel with Iron level of 15 confirming IDA, IV iron provided  - repeat CBC in AM   Non-small cell carcinoma of lung, stage 3 - notified Dr. Julien Nordmann of pt's admission    Back area wound, thermal injury - ? Radiation induced - wound care team consulted and assistance is appreciated    Non severe PCM - in the setting of acute illness imposed on chronic illness, radiating mucositis and oral thrush - advance diet as pt able to tolerate   DVT prophylaxis - SCD's  Code Status: Full.  Family Communication:  plan of care discussed with the patient Disposition Plan: possible d/c in 24 - 48 hours if pt eating better and ambulating   IV access:  Peripheral IV  Procedures and diagnostic studies:    Ct Angio Chest Pe W/cm &/or Wo Cm  09/02/2014  No evidence of pulmonary embolus. 2. Large right upper lobe  lung mass is relatively stable in size, measuring 7.8 cm. Significantly increased cavitation. Given its appearance, underlying fungal infection cannot be entirely excluded. 3. New patchy airspace opacification in the periphery of the right upper lobe raises concern for superimposed infection, possibly atypical in nature. Additional mild patchy  opacity noted in the central portions of the lungs bilaterally, also concerning for pneumonia. 4. Mild interstitial prominence seen. Mild emphysematous change noted bilaterally, more prominent at the left upper lobe. 5. 1.7 cm right hilar node is concerning for nodal metastasis, similar in appearance to the prior study.   Dg Chest Port 1 View  09/01/2014   Increased necrosis involving the patient's right upper lobe lung mass, status post chemotherapy and radiation therapy, with underlying right upper lobe airspace opacification raising concern for postobstructive pneumonia, given the patient's symptoms.     Medical Consultants:  None   Other Consultants:  None   IAnti-Infectives:   Vancomycin 5/1 --> 5/2 Zosyn 5/1 --> 5/2 Levaquin 5/3 -->  [name], MD  [facility] Pager [phone]  If 7PM-7AM, please contact night-coverage [email or web address] Password [email or web address] 09/04/2014, 2:46 PM   LOS: 2 days   HPI/Subjective: No events overnight. Pt with persistent exertional dyspnea and coughing, reports she feels better overall.   Objective: Filed Vitals:   09/03/14 2151 09/04/14 0442 09/04/14 0915 09/04/14 1015  BP: 129/95 118/79  124/85  Pulse: 88 92  94  Temp: 98.8 F (37.1 C) 99.1 F (37.3 C)  99.6 F (37.6 C)  TempSrc: Oral Oral  Oral  Resp: $Remo'20 20  20  'tfXRB$ Height:      Weight:      SpO2: 98% 98% 98% 97%    Intake/Output Summary (Last 24 hours) at 09/04/14 1446 Last data filed at 09/03/14 2300  Gross per 24 hour  Intake    760 ml  Output      0 ml  Net    760 ml    Exam:   General:  Pt is alert, follows commands appropriately, not in acute distress  Cardiovascular: Regular rate and rhythm, S1/S2, no murmurs, no rubs, no gallops  Respiratory: Bilateral rhonchi and worse at bases, no wheezing   Abdomen: Soft, non tender, non distended, bowel sounds present, no guarding  Extremities: No edema, pulses DP and PT palpable bilaterally  Neuro: Grossly nonfocal  Data  Reviewed: Basic Metabolic Panel:  Recent Labs Lab 09/01/14 2148 09/02/14 0906 09/03/14 0407 09/04/14 0353  NA 141 137 133* 136  K 3.0* 3.4* 3.4* 4.0  CL 108 109 104 104  CO2 $Re'26 23 23 27  'fAh$ GLUCOSE 117* 86 92 118*  BUN $Re'11 6 6 8  'BQs$ CREATININE 0.56 0.49 0.58 0.69  CALCIUM 8.3* 7.8* 8.0* 8.1*  MG  --   --  1.5* 2.2   Liver Function Tests:  Recent Labs Lab 09/01/14 2148 09/02/14 0906  AST 17 11*  ALT 15 11*  ALKPHOS 72 58  BILITOT 0.5 1.2  PROT 6.2 5.6*  ALBUMIN 2.6* 2.2*   CBC:  Recent Labs Lab 09/01/14 2148 09/01/14 2253 09/02/14 1508 09/03/14 0407 09/04/14 0353  WBC 9.4  --  8.5 7.7 6.8  NEUTROABS 7.0  --   --   --   --   HGB 3.0* 5.1* 12.0 12.1 12.4  HCT 9.1* 15.5* 36.1 36.4 38.2  MCV 87.5  --  84.3 83.7 86.2  PLT 376  --  224 253 251    Scheduled Meds: . docusate sodium  100 mg Oral Daily  .  fluconazole  100 mg Oral Daily  . iron polysaccharides  150 mg Oral Daily  . levofloxacin  500 mg Oral Daily  . magic mouthwash w/lidocaine  15 mL Oral TID  . magnesium sulfate 1 - 4 g bolus IVPB  3 g Intravenous Once  . metoprolol succinate  12.5 mg Oral BID  . pantoprazole  40 mg Oral Q0600  . PRUTECT  1 application Topical Daily  . sucralfate  1 g Oral TID WC & HS   Continuous Infusions:

## 2014-09-04 NOTE — Evaluation (Signed)
Occupational Therapy One Time Evaluation Patient Details Name: Desani Sprung MRN: [id number] DOB: [birth year] Today's Date: 09/04/2014    History of Present Illness Pt presents with cough and shortness of breath. she has a past medical history of Squamous cell carcinoma of right lung    Clinical Impression   Pt doing very well and able to mobilize in the room for ADL independently and perform ADL tasks. She lives with her mother and she can assist PRN if needed. No further OT needs. Screened for PT and no PT needs. Pt encouraged to sit up in the chair later today but wanted to return to bed right now.    Follow Up Recommendations  No OT follow up;Supervision - Intermittent    Equipment Recommendations  None recommended by OT    Recommendations for Other Services       Precautions / Restrictions Precautions Precautions: None Restrictions Weight Bearing Restrictions: No      Mobility Bed Mobility Overal bed mobility: Independent             General bed mobility comments: HOB raised.  Transfers Overall transfer level: Independent Equipment used: None                  Balance                                            ADL Overall ADL's : Independent                                       General ADL Comments: Pt able to mobilize in the room and to the bathroom without a device independently. She transferred on and off the commode safely and independently and stepped in and out of tub/shower without difficulty. No LOB noted during any functional tasks. O2 on RA 98% wtih activity. Mother will be with pt at d/c.      Vision     Perception     Praxis      Pertinent Vitals/Pain Pain Assessment: 0-10 Pain Score: 6  Pain Location: headache Pain Descriptors / Indicators: Aching Pain Intervention(s): Other (comment);Monitored during session (pt states she informed nursing.)     Hand Dominance     Extremity/Trunk  Assessment Upper Extremity Assessment Upper Extremity Assessment: Overall WFL for tasks assessed           Communication Communication Communication: No difficulties   Cognition Arousal/Alertness: Awake/alert Behavior During Therapy: WFL for tasks assessed/performed Overall Cognitive Status: Within Functional Limits for tasks assessed                     General Comments       Exercises       Shoulder Instructions      Home Living Family/patient expects to be discharged to:: Private residence Living Arrangements: Parent Available Help at Discharge: Family   Home Access: Stairs to enter Technical brewer of Steps: "a couple"   Home Layout: One level     Bathroom Shower/Tub: Teacher, early years/pre: Standard     Home Equipment: None          Prior Functioning/Environment Level of Independence: Independent             OT Diagnosis: Generalized weakness   OT  Problem List:     OT Treatment/Interventions:      OT Goals(Current goals can be found in the care plan section) Acute Rehab OT Goals Patient Stated Goal: home when ready OT Goal Formulation: With patient/family  OT Frequency:     Barriers to D/C:            Co-evaluation              End of Session    Activity Tolerance: Patient tolerated treatment well Patient left: in bed;with call bell/phone within reach;with family/visitor present   Time: 1164-3539 OT Time Calculation (min): 16 min Charges:  OT General Charges $OT Visit: 1 Procedure OT Evaluation $Initial OT Evaluation Tier I: 1 Procedure G-Codes:    Jules Schick [phone] 09/04/2014, 10:05 AM

## 2014-09-04 NOTE — Progress Notes (Signed)
PT Cancellation/Screen Note  Patient Details Name: Barbara Frost MRN: [id number] DOB: 07-Nov-1970   Cancelled Treatment:    Reason Eval/Treat Not Completed: PT screened, no needs identified, will sign off. Spoke with OT who worked with pt-pt was Ind with all mobility. Will sign off.   Weston Anna, MPT Pager: [phone]

## 2014-09-04 NOTE — Progress Notes (Signed)
[place] for Infectious [facility]  Date of Admission:  09/01/2014  Antibiotics: levaquin  Subjective: Feels well  Objective: Temp:  [98.3 F (36.8 C)-99.6 F (37.6 C)] 99.6 F (37.6 C) (05/03 1015) Pulse Rate:  [75-94] 94 (05/03 1015) Resp:  [15-21] 20 (05/03 1015) BP: (118-154)/(79-107) 124/85 mmHg (05/03 1015) SpO2:  [95 %-100 %] 97 % (05/03 1015)  General: awake, alert, nad Skin: no rashes Lungs: CTA B Cor: RRR   Lab Results Lab Results  Component Value Date   WBC 6.8 09/04/2014   HGB 12.4 09/04/2014   HCT 38.2 09/04/2014   MCV 86.2 09/04/2014   PLT 251 09/04/2014    Lab Results  Component Value Date   CREATININE 0.69 09/04/2014   BUN 8 09/04/2014   NA 136 09/04/2014   K 4.0 09/04/2014   CL 104 09/04/2014   CO2 27 09/04/2014    Lab Results  Component Value Date   ALT 11* 09/02/2014   AST 11* 09/02/2014   ALKPHOS 58 09/02/2014   BILITOT 1.2 09/02/2014      Microbiology: Recent Results (from the past 240 hour(s))  Blood Culture (routine x 2)     Status: None (Preliminary result)   Collection Time: 09/01/14  9:40 PM  Result Value Ref Range Status   Specimen Description BLOOD LEFT ANTECUBITAL  Final   Special Requests BOTTLES DRAWN AEROBIC AND ANAEROBIC 5CC  Final   Culture   Final           BLOOD CULTURE RECEIVED NO GROWTH TO DATE CULTURE WILL BE HELD FOR 5 DAYS BEFORE ISSUING A FINAL NEGATIVE REPORT Performed at [facility]    Report Status PENDING  Incomplete  Blood Culture (routine x 2)     Status: None (Preliminary result)   Collection Time: 09/01/14  9:45 PM  Result Value Ref Range Status   Specimen Description BLOOD LEFT HAND  Final   Special Requests BOTTLES DRAWN AEROBIC AND ANAEROBIC 5CC  Final   Culture   Final           BLOOD CULTURE RECEIVED NO GROWTH TO DATE CULTURE WILL BE HELD FOR 5 DAYS BEFORE ISSUING A FINAL NEGATIVE REPORT Performed at [facility]    Report Status PENDING  Incomplete  Urine culture      Status: None   Collection Time: 09/01/14 10:48 PM  Result Value Ref Range Status   Specimen Description URINE, RANDOM  Final   Special Requests NONE  Final   Colony Count NO GROWTH Performed at [facility]   Final   Culture NO GROWTH Performed at [facility]   Final   Report Status 09/02/2014 FINAL  Final  MRSA PCR Screening     Status: None   Collection Time: 09/02/14  8:33 AM  Result Value Ref Range Status   MRSA by PCR NEGATIVE NEGATIVE Final    Comment:        The GeneXpert MRSA Assay (FDA approved for NASAL specimens only), is one component of a comprehensive MRSA colonization surveillance program. It is not intended to diagnose MRSA infection nor to guide or monitor treatment for MRSA infections.   Culture, sputum-assessment     Status: None   Collection Time: 09/03/14  4:52 PM  Result Value Ref Range Status   Specimen Description Expect. Sput  Final   Special Requests Normal  Final   Sputum evaluation   Final    MICROSCOPIC FINDINGS SUGGEST THAT THIS SPECIMEN IS NOT  REPRESENTATIVE OF LOWER RESPIRATORY SECRETIONS. PLEASE RECOLLECT. CALLED TO P.WEST RN AT1800 ON 5.2.16 BY W.BUCHHOLZ.   Report Status 09/03/2014 FINAL  Final    Studies/Results: No results found.  Assessment/Plan:  1) postobstructive pneumonia - continue with levaquin 500 mg po daily for 2 weeks from now and then repeat a CT san in about 3-4 weeks ( I believe one scheduled through oncology already).  If it is still with cavitation and opacities, may need pulmonary input for possible bronchoscopy but she is feeling much better with antibacterials so doubt a different organism.       Barbara Frost, [place] [facility] [place] [email or web address] [phone] pager   [phone] cell 09/04/2014, 2:55 PM

## 2014-09-05 LAB — BASIC METABOLIC PANEL
Anion gap: 9 (ref 5–15)
BUN: 7 mg/dL (ref 6–20)
CO2: 26 mmol/L (ref 22–32)
CREATININE: 0.54 mg/dL (ref 0.44–1.00)
Calcium: 8.4 mg/dL — ABNORMAL LOW (ref 8.9–10.3)
Chloride: 101 mmol/L (ref 101–111)
GFR calc non Af Amer: >60 mL/min (ref >60)
GLUCOSE: 101 mg/dL — AB (ref 70–99)
Potassium: 3.9 mmol/L (ref 3.5–5.1)
Sodium: 136 mmol/L (ref 135–145)

## 2014-09-05 LAB — TYPE AND SCREEN
ABO/RH(D): B POS
ANTIBODY SCREEN: NEGATIVE
UNIT DIVISION: 0
UNIT DIVISION: 0
Unit division: 0
Unit division: 0
Unit division: 0

## 2014-09-05 LAB — MAGNESIUM: Magnesium: 2 mg/dL (ref 1.7–2.4)

## 2014-09-05 NOTE — Progress Notes (Signed)
[place] for Infectious Disease  Date of Admission:  09/01/2014  Antibiotics: levaquin  Subjective: No acute changes  Objective: Temp:  [98.5 F (36.9 C)-100.3 F (37.9 C)] 98.5 F (36.9 C) (05/04 1424) Pulse Rate:  [80-105] 80 (05/04 1424) Resp:  [16-20] 18 (05/04 1424) BP: (99-133)/(69-92) 99/69 mmHg (05/04 1424) SpO2:  [93 %-99 %] 99 % (05/04 1424)     Lab Results Lab Results  Component Value Date   WBC 6.8 09/04/2014   HGB 12.4 09/04/2014   HCT 38.2 09/04/2014   MCV 86.2 09/04/2014   PLT 251 09/04/2014    Lab Results  Component Value Date   CREATININE 0.54 09/05/2014   BUN 7 09/05/2014   NA 136 09/05/2014   K 3.9 09/05/2014   CL 101 09/05/2014   CO2 26 09/05/2014    Lab Results  Component Value Date   ALT 11* 09/02/2014   AST 11* 09/02/2014   ALKPHOS 58 09/02/2014   BILITOT 1.2 09/02/2014      Microbiology: Recent Results (from the past 240 hour(s))  Blood Culture (routine x 2)     Status: None (Preliminary result)   Collection Time: 09/01/14  9:40 PM  Result Value Ref Range Status   Specimen Description BLOOD LEFT ANTECUBITAL  Final   Special Requests BOTTLES DRAWN AEROBIC AND ANAEROBIC 5CC  Final   Culture   Final           BLOOD CULTURE RECEIVED NO GROWTH TO DATE CULTURE WILL BE HELD FOR 5 DAYS BEFORE ISSUING A FINAL NEGATIVE REPORT Performed at [facility]    Report Status PENDING  Incomplete  Blood Culture (routine x 2)     Status: None (Preliminary result)   Collection Time: 09/01/14  9:45 PM  Result Value Ref Range Status   Specimen Description BLOOD LEFT HAND  Final   Special Requests BOTTLES DRAWN AEROBIC AND ANAEROBIC 5CC  Final   Culture   Final           BLOOD CULTURE RECEIVED NO GROWTH TO DATE CULTURE WILL BE HELD FOR 5 DAYS BEFORE ISSUING A FINAL NEGATIVE REPORT Performed at [facility]    Report Status PENDING  Incomplete  Urine culture     Status: None   Collection Time: 09/01/14 10:48 PM  Result  Value Ref Range Status   Specimen Description URINE, RANDOM  Final   Special Requests NONE  Final   Colony Count NO GROWTH Performed at [facility]   Final   Culture NO GROWTH Performed at [facility]   Final   Report Status 09/02/2014 FINAL  Final  MRSA PCR Screening     Status: None   Collection Time: 09/02/14  8:33 AM  Result Value Ref Range Status   MRSA by PCR NEGATIVE NEGATIVE Final    Comment:        The GeneXpert MRSA Assay (FDA approved for NASAL specimens only), is one component of a comprehensive MRSA colonization surveillance program. It is not intended to diagnose MRSA infection nor to guide or monitor treatment for MRSA infections.   Culture, sputum-assessment     Status: None   Collection Time: 09/03/14  4:52 PM  Result Value Ref Range Status   Specimen Description Expect. Sput  Final   Special Requests Normal  Final   Sputum evaluation   Final    MICROSCOPIC FINDINGS SUGGEST THAT THIS SPECIMEN IS NOT REPRESENTATIVE OF LOWER RESPIRATORY SECRETIONS. PLEASE RECOLLECT. CALLED TO P.WEST  RN AT1800 ON 5.2.16 BY W.BUCHHOLZ.   Report Status 09/03/2014 FINAL  Final    Studies/Results: No results found.  Assessment/Plan:  1) postobstructive pneumonia - continue with levaquin 500 mg po daily for 2 weeks from now through May 16 and then repeat a CT san in about 3-4 weeks ( I believe one scheduled through oncology already).  If it is still with cavitation and opacities, may need pulmonary input for possible bronchoscopy but she is feeling much better with antibacterials so doubt a different organism.      I will arrange follow up in [facility] in about 1 month  I will sign off, please call with questions   Scharlene Gloss, [place] [facility] [place] [email or web address] [phone] pager   [phone] cell 09/05/2014, 2:54 PM

## 2014-09-05 NOTE — Progress Notes (Signed)
[facility] PROGRESS NOTE  Molina Hollenback HEN:[id number] DOB: 1970-09-25 DOA: 09/01/2014 PCP: [name], MD  Assessment/Plan: #1 sepsis Secondary to postobstructive pneumonia noted on CT chest. Findings noted on CT chest with concerns for possible underlying fungal infection and possible atypical infection. Patient with clinical improvement. Urinary strep pneumococcus antigen is negative. Influenza PCR is negative. Urine Legionella antigen is pending. Blood cultures are pending. Patient with low-grade fever overnight. WBC is normal. IV antibiotics have been narrowed down to oral Levaquin which patient will continue to take for the next 2 weeks through 09/17/2014.  #2 postobstructive pneumonia CT chest with concerns for possible underlying fungal infection and possible atypical infection noted. Patient on chemotherapy with recent diagnosis of lung cancer. Patient is currently afebrile over the past 24 hours. WBCs normalized. Patient has been pancultured and results are pending. Patient has been seen in consultation by infectious diseases who recommended narrowing antibiotics down to Levaquin 500 mg daily and treat for the next 2 weeks through 09/17/2014. Patient will need a repeat CT scan in about 3-4 weeks for follow-up. Per ID if cavitations and opacities still remain may need pulmonary input for possible bronchoscopy. Patient will follow-up with ID as outpatient.  #3 hypokalemia Magnesium level is 2.0 from 1.5. Keep magnesium greater than 2. Potassium has been repleted. Follow.  #4 symptomatic anemia Likely secondary to chemotherapy and menstrual cycle. Patient is status post 3 units packed red blood cells. Anemia panel consistent with iron deficiency anemia and AOCD with a iron level of 15 and a ferritin level of 413. Hemoglobin currently stable. Patient s/p IV iron. Will likely benefit from oral iron on discharge. Follow.  #5 non-small cell lung cancer stage III Oncology has been notified  of admission.  #6 thermal injury Patient with some skin discrimination on anterior and posterior chest secondary to radiation treatments. Patient has been seen by wound care nurse and dressing changes have been recommended.  #7 oral thrush Continue Diflucan.  #8 dysphagia/probable mucositis Patient with complaints of swallowing feels is secondary to radiation therapy. Patient also noted to have oral thrush. Clinical improvement. Continue Magic mouthwash with lidocaine, Diflucan. Continue Carafate.  #9 prophylaxis Pepcid for GI prophylaxis. SCDs for DVT prophylaxis.  Code Status: Full Family Communication: Updated patient. No family at bedside. Disposition Plan: remain inpatient. Hopefully with continued improvement could possibly discharge tomorrow.   Consultants:  Infectious diseases: Dr. Linus Salmons 09/03/2014  Oncology: Dr. Earlie Server 09/04/2014  Procedures:  Chest x-ray 09/01/2014  CT angiogram of chest 09/02/2014   3 units packed red blood cells.  Antibiotics:  IV Zosyn 09/01/2014>>>>09/03/2014  IV vancomycin 09/01/2014>>>>>09/03/2014  IV azithromycin 09/03/2014>>>>09/03/2014  Oral Levaquin 09/04/2014  HPI/Subjective: Patient states shortness of breath has improved. Patient states swallowing has improved. Patient complaining of right-sided pain.  Objective: Filed Vitals:   09/05/14 1424  BP: 99/69  Pulse: 80  Temp: 98.5 F (36.9 C)  Resp: 18   No intake or output data in the 24 hours ending 09/05/14 1435 Filed Weights   09/01/14 2148 09/02/14 0308  Weight: 56.7 kg (125 lb) 62.1 kg (136 lb 14.5 oz)    Exam:   General:  NAD. White exudates noted on upper palate of oral mucosa.  Cardiovascular: RRR. Mepilex on anterior chest wall. Mepilex on right scapular region.  Respiratory: CTAB. No wheezing, no crackles.  Abdomen: Soft, nontender, nondistended, positive bowel sounds.  Musculoskeletal: No clubbing cyanosis or edema.  Data Reviewed: Basic  Metabolic Panel:  Recent Labs Lab 09/01/14 2148 09/02/14 0906 09/03/14 0407  09/04/14 0353 09/05/14 0403  NA 141 137 133* 136 136  K 3.0* 3.4* 3.4* 4.0 3.9  CL 108 109 104 104 101  CO2 '26 23 23 27 26  '$ GLUCOSE 117* 86 92 118* 101*  BUN '11 6 6 8 7  '$ CREATININE 0.56 0.49 0.58 0.69 0.54  CALCIUM 8.3* 7.8* 8.0* 8.1* 8.4*  MG  --   --  1.5* 2.2 2.0   Liver Function Tests:  Recent Labs Lab 09/01/14 2148 09/02/14 0906  AST 17 11*  ALT 15 11*  ALKPHOS 72 58  BILITOT 0.5 1.2  PROT 6.2 5.6*  ALBUMIN 2.6* 2.2*   No results for input(s): LIPASE, AMYLASE in the last 168 hours. No results for input(s): AMMONIA in the last 168 hours. CBC:  Recent Labs Lab 09/01/14 2148 09/01/14 2253 09/02/14 1508 09/03/14 0407 09/04/14 0353  WBC 9.4  --  8.5 7.7 6.8  NEUTROABS 7.0  --   --   --   --   HGB 3.0* 5.1* 12.0 12.1 12.4  HCT 9.1* 15.5* 36.1 36.4 38.2  MCV 87.5  --  84.3 83.7 86.2  PLT 376  --  224 253 251   Cardiac Enzymes: No results for input(s): CKTOTAL, CKMB, CKMBINDEX, TROPONINI in the last 168 hours. BNP (last 3 results) No results for input(s): BNP in the last 8760 hours.  ProBNP (last 3 results) No results for input(s): PROBNP in the last 8760 hours.  CBG: No results for input(s): GLUCAP in the last 168 hours.  Recent Results (from the past 240 hour(s))  Blood Culture (routine x 2)     Status: None (Preliminary result)   Collection Time: 09/01/14  9:40 PM  Result Value Ref Range Status   Specimen Description BLOOD LEFT ANTECUBITAL  Final   Special Requests BOTTLES DRAWN AEROBIC AND ANAEROBIC 5CC  Final   Culture   Final           BLOOD CULTURE RECEIVED NO GROWTH TO DATE CULTURE WILL BE HELD FOR 5 DAYS BEFORE ISSUING A FINAL NEGATIVE REPORT Performed at [facility]    Report Status PENDING  Incomplete  Blood Culture (routine x 2)     Status: None (Preliminary result)   Collection Time: 09/01/14  9:45 PM  Result Value Ref Range Status   Specimen  Description BLOOD LEFT HAND  Final   Special Requests BOTTLES DRAWN AEROBIC AND ANAEROBIC 5CC  Final   Culture   Final           BLOOD CULTURE RECEIVED NO GROWTH TO DATE CULTURE WILL BE HELD FOR 5 DAYS BEFORE ISSUING A FINAL NEGATIVE REPORT Performed at [facility]    Report Status PENDING  Incomplete  Urine culture     Status: None   Collection Time: 09/01/14 10:48 PM  Result Value Ref Range Status   Specimen Description URINE, RANDOM  Final   Special Requests NONE  Final   Colony Count NO GROWTH Performed at [facility]   Final   Culture NO GROWTH Performed at [facility]   Final   Report Status 09/02/2014 FINAL  Final  MRSA PCR Screening     Status: None   Collection Time: 09/02/14  8:33 AM  Result Value Ref Range Status   MRSA by PCR NEGATIVE NEGATIVE Final    Comment:        The GeneXpert MRSA Assay (FDA approved for NASAL specimens only), is one component of a comprehensive MRSA colonization surveillance program.  It is not intended to diagnose MRSA infection nor to guide or monitor treatment for MRSA infections.   Culture, sputum-assessment     Status: None   Collection Time: 09/03/14  4:52 PM  Result Value Ref Range Status   Specimen Description Expect. Sput  Final   Special Requests Normal  Final   Sputum evaluation   Final    MICROSCOPIC FINDINGS SUGGEST THAT THIS SPECIMEN IS NOT REPRESENTATIVE OF LOWER RESPIRATORY SECRETIONS. PLEASE RECOLLECT. CALLED TO P.WEST RN AT1800 ON 5.2.16 BY W.BUCHHOLZ.   Report Status 09/03/2014 FINAL  Final     Studies: No results found.  Scheduled Meds: . docusate sodium  100 mg Oral Daily  . fluconazole  100 mg Oral Daily  . iron polysaccharides  150 mg Oral Daily  . levofloxacin  500 mg Oral Daily  . magic mouthwash w/lidocaine  15 mL Oral TID  . magnesium sulfate 1 - 4 g bolus IVPB  3 g Intravenous Once  . metoprolol succinate  12.5 mg Oral BID  . pantoprazole  40 mg Oral Q0600  . PRUTECT  1  application Topical Daily  . sucralfate  1 g Oral TID WC & HS   Continuous Infusions:   Principal Problem:   Sepsis Active Problems:   Lobar pneumonia: CAP   Anemia   Non-small cell carcinoma of lung, stage 3   Symptomatic anemia   Postobstructive pneumonia   Thrush, oral   Mucositis due to radiation therapy: Probable    Time spent: 40 mins    [facility] MD [facility] Pager [phone]. If 7PM-7AM, please contact night-coverage at [email or web address], password [facility] 09/05/2014, 2:35 PM  LOS: 3 days

## 2014-09-06 LAB — CBC
HCT: 36.4 % (ref 36.0–46.0)
Hemoglobin: 11.5 g/dL — ABNORMAL LOW (ref 12.0–15.0)
MCH: 27.8 pg (ref 26.0–34.0)
MCHC: 31.6 g/dL (ref 30.0–36.0)
MCV: 87.9 fL (ref 78.0–100.0)
PLATELETS: 254 10*3/uL (ref 150–400)
RBC: 4.14 MIL/uL (ref 3.87–5.11)
RDW: 19.7 % — ABNORMAL HIGH (ref 11.5–15.5)
WBC: 8.2 10*3/uL (ref 4.0–10.5)

## 2014-09-06 LAB — BASIC METABOLIC PANEL
Anion gap: 7 (ref 5–15)
BUN: 8 mg/dL (ref 6–20)
CHLORIDE: 101 mmol/L (ref 101–111)
CO2: 26 mmol/L (ref 22–32)
CREATININE: 0.55 mg/dL (ref 0.44–1.00)
Calcium: 8.3 mg/dL — ABNORMAL LOW (ref 8.9–10.3)
GFR calc Af Amer: >60 mL/min (ref >60)
GLUCOSE: 125 mg/dL — AB (ref 70–99)
POTASSIUM: 3.4 mmol/L — AB (ref 3.5–5.1)
Sodium: 134 mmol/L — ABNORMAL LOW (ref 135–145)

## 2014-09-06 LAB — MAGNESIUM: Magnesium: 2 mg/dL (ref 1.7–2.4)

## 2014-09-06 MED ORDER — ALBUTEROL SULFATE HFA 108 (90 BASE) MCG/ACT IN AERS: 2.0000 | INHALATION_SPRAY | RESPIRATORY_TRACT | Status: DC | PRN

## 2014-09-06 MED ORDER — FLUCONAZOLE 40 MG/ML PO SUSR: 100.0000 mg | Freq: Every day | ORAL | Status: AC

## 2014-09-06 MED ORDER — ALBUTEROL SULFATE HFA 108 (90 BASE) MCG/ACT IN AERS
2.0000 | INHALATION_SPRAY | RESPIRATORY_TRACT | Status: DC | PRN
  Filled 2014-09-06: qty 6.7

## 2014-09-06 MED ORDER — LEVOFLOXACIN 500 MG PO TABS: 500.0000 mg | ORAL_TABLET | Freq: Every day | ORAL | Status: AC

## 2014-09-06 MED ORDER — POTASSIUM CHLORIDE CRYS ER 20 MEQ PO TBCR
40.0000 meq | EXTENDED_RELEASE_TABLET | Freq: Once | ORAL | Status: AC
  Administered 2014-09-06: 40 meq via ORAL
  Filled 2014-09-06: qty 2

## 2014-09-06 MED ORDER — ALBUTEROL SULFATE HFA 108 (90 BASE) MCG/ACT IN AERS: 1.0000 | INHALATION_SPRAY | Freq: Four times a day (QID) | RESPIRATORY_TRACT | Status: DC | PRN

## 2014-09-06 MED ORDER — OXYCODONE-ACETAMINOPHEN 5-325 MG PO TABS: 1.0000 | ORAL_TABLET | Freq: Four times a day (QID) | ORAL | Status: DC | PRN

## 2014-09-06 MED ORDER — MAGIC MOUTHWASH W/LIDOCAINE: 15.0000 mL | Freq: Three times a day (TID) | ORAL | Status: DC

## 2014-09-06 NOTE — Discharge Summary (Signed)
Physician Discharge Summary  Barbara Frost NWG:[id number] DOB: 05/16/1970 DOA: 09/01/2014  PCP: [name], MD  Admit date: 09/01/2014 Discharge date: 09/06/2014  Time spent: 65 minutes  Recommendations for Outpatient Follow-up:  1. Follow-up with [name], MD in 1 week. Will follow-up patient will need a CBC and a basic metabolic profile done to follow-up on hemoglobin, electrolytes and renal function. 2. Follow-up with Dr. Lorna Few of oncology as previously scheduled. Patient has a regimen scheduled for repeat CT scan to be done which can be used to follow-up on cavitary lesions in postobstructive pneumonia noted during this hospitalization. If cavitary lesions and opacities are still present on repeat CT scan, patient will need referral to pulmonary for possible bronchoscopy and further evaluation. 3. Patient is to follow-up with Dr. Linus Salmons of infectious diseases in 1 month.  Discharge Diagnoses:  Principal Problem:   Sepsis Active Problems:   Lobar pneumonia: CAP   Anemia   Non-small cell carcinoma of lung, stage 3   Symptomatic anemia   Postobstructive pneumonia   Thrush, oral   Mucositis due to radiation therapy: Probable   Discharge Condition: Stable and improved  Diet recommendation: Regular  Filed Weights   09/01/14 2148 09/02/14 0308  Weight: 56.7 kg (125 lb) 62.1 kg (136 lb 14.5 oz)    History of present illness:  Per Dr Marijean Heath is a 44 y.o. 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   Patient with known history of squamous cell carcinoma diagnosed in [place] 2016 patient originally presented with large mass right upper lobe and mediastinal invasion. She has been getting radiation therapy by Dr. Valere Dross and beginning chemotherapy as well with carboplatin and paclitaxel. Some effusion was in April 18.  The patient presented to emergency department with fever up to 102.2. She's been endorsing chest  pain which was worse with deep breaths and coughing this was unchanged from prior.  In emergency department she was noted to be severely anemic with hemoglobin down to 3.0 and up to 5.1 on repeat. Patient had no prior history of significant anemia. On the 18th of April her hemoglobin was 12. She denied blood in stool, no black stools, She had been having vaginal sppoting for the past 3 weeks.  Chest x-ray showed possible postobstructive pneumonia  Hospitalist was called for admission for severe anemia and presumed postobstructive pneumonia  Hospital Course:  #1 sepsis Secondary to postobstructive pneumonia noted on CT chest. Findings noted on CT chest with concerns for possible underlying fungal infection and possible atypical infection. Patient was admitted to the step down unit. Patient was placed empirically on IV vancomycin, IV Zosyn, IV azithromycin. Patient improved clinically. Urinary strep pneumococcus antigen was negative. Influenza PCR was negative. Urine Legionella antigen was pending at the time of discharge. Blood cultures are pending, with no growth to date. Patient improved clinically was subsequently transitioned to the regular floor. Patient's white count normalized. Patient's fever curve trended down. Patient was seen in consultation by ID and patient's antibiotics were subsequently narrowed to oral Levaquin which patient tolerated. Patient's fever curve trended down. Patient's white count normalized. It was recommended by ID based on CT findings of the chest with pneumonia and cavitary lesions that patient be on Levaquin for the next 2 weeks through 09/17/2014. Patient will need repeat CT of the chest done as outpatient on follow-up and if cavitations and opacities still remain will likely need pulmonary input for possible bronchoscopy and further evaluation. Patient  will be discharged in stable and improved condition.   #2 postobstructive pneumonia CT chest with concerns for possible  underlying fungal infection and possible atypical infection noted. Patient on chemotherapy with recent diagnosis of lung cancer. Patient had fevers during the hospitalization that trended down such that by day of discharge patient was afebrile for at least 24 hours. WBCs normalized. Patient has been pancultured and results are pending. Patient has been seen in consultation by infectious diseases who recommended narrowing antibiotics down to Levaquin 500 mg daily and treat for the next 2 weeks through 09/17/2014. Patient will need a repeat CT scan in about 3-4 weeks for follow-up. Per ID if cavitations and opacities still remain will need pulmonary input for possible bronchoscopy. Patient will follow-up with ID as outpatient.  #3 hypokalemia Patient was noted to be hypokalemic during the hospitalization. Magnesium level was checked which came back decreased at 1.5. Patient's magnesium was repleted and kept at 2. Patient's potassium was also repleted. Outpatient follow-up.  #4 symptomatic anemia Likely secondary to chemotherapy and menstrual cycle. Patient was transfused 3 units packed red blood cells. Anemia panel consistent with iron deficiency anemia and AOCD with a iron level of 15 and a ferritin level of 413. Patient was given IV iron and placed on oral iron supplementation. Patient's hemoglobin stabilized and was 11.5 on day of discharge. Outpatient follow-up.   #5 non-small cell lung cancer stage III Oncology has been notified of admission. Outpatient follow-up.  #6 thermal injury Patient with some skin discrimination on anterior and posterior chest secondary to radiation treatments. Patient has been seen by wound care nurse and dressing changes have been recommended.  #7 oral thrush Patient noted to have oral thrush during the hospitalization and patient was subsequently started on Diflucan. Patient's thrush improved and patient be discharged home on Diflucan to complete a two-week course.    #8 dysphagia/probable mucositis Patient had complaints of swallowing felt to be secondary to radiation therapy. Patient also noted to have oral thrush. Patient was placed on Magic mouthwash with lidocaine, Diflucan and Carafate. Patient's swallowing improved and patient was tolerating a diet. Patient be discharged home on a 10-14 day course.    Procedures:  Chest x-ray 09/01/2014  CT angiogram of chest 09/02/2014   3 units packed red blood cells.  Consultations:  Infectious diseases: Dr. Linus Salmons 09/03/2014  Oncology: Dr. Earlie Server 09/04/2014  Discharge Exam: Filed Vitals:   09/06/14 0425  BP: 100/60  Pulse: 76  Temp: 98.3 F (36.8 C)  Resp: 16    General: NAD Cardiovascular: RRR Respiratory: CTAB  Discharge Instructions   Discharge Instructions    Diet general    Complete by:  As directed      Discharge instructions    Complete by:  As directed   Follow up with [name], MD in 1 week. Follow up with Dr Earlie Server as scheduled. Follow up with Dr Linus Salmons in [place] clinic in 1 month.     Increase activity slowly    Complete by:  As directed           Discharge Medication List as of 09/06/2014 12:44 PM    START taking these medications   Details  Alum & Mag Hydroxide-Simeth (MAGIC MOUTHWASH W/LIDOCAINE) SOLN Take 15 mLs by mouth 3 (three) times daily. Take for 2 weeks., Starting 09/06/2014, Until Discontinued, Print    fluconazole (DIFLUCAN) 40 MG/ML suspension Take 2.5 mLs (100 mg total) by mouth daily. Take for 2 weeks then stop., Starting 09/06/2014, Until Tue  09/18/14, Print    levofloxacin (LEVAQUIN) 500 MG tablet Take 1 tablet (500 mg total) by mouth daily. Take for 11 days then stop., Starting 09/06/2014, Until Mon 09/17/14, Print      CONTINUE these medications which have CHANGED   Details  albuterol (PROVENTIL HFA;VENTOLIN HFA) 108 (90 BASE) MCG/ACT inhaler Inhale 1-2 puffs into the lungs every 6 (six) hours as needed for wheezing or shortness of breath. Use 2  puffs 3 times daily x 4 days, then every 6 hours as needed., Starting 09/06/2014, Until Discontinued, No Print    oxyCODONE-acetaminophen (PERCOCET/ROXICET) 5-325 MG per tablet Take 1 tablet by mouth every 6 (six) hours as needed for severe pain., Starting 09/06/2014, Until Discontinued, Print      CONTINUE these medications which have NOT CHANGED   Details  aspirin 325 MG EC tablet Take 325 mg by mouth daily. , Until Discontinued, Historical Med    docusate sodium (COLACE) 100 MG capsule Take 1 capsule (100 mg total) by mouth 2 (two) times daily., Starting 07/03/2014, Until Discontinued, Print    emollient (BIAFINE) cream Apply 1 application topically daily., Until Discontinued, Historical Med    ibuprofen (ADVIL,MOTRIN) 600 MG tablet Take 1 tablet (600 mg total) by mouth 4 (four) times daily. Take for 4 days then use as needed., Starting 07/03/2014, Until Discontinued, Print    lidocaine (XYLOCAINE) 2 % solution Patient: Mix 1part 2% viscous lidocaine, 1part H20. Swish and/or swallow 104m of this mixture, 347m before meals and at bedtime, up to QID, Normal    prochlorperazine (COMPAZINE) 10 MG tablet Take 1 tablet (10 mg total) by mouth every 6 (six) hours as needed for nausea or vomiting., Starting 07/23/2014, Until Discontinued, Normal    sucralfate (CARAFATE) 1 G tablet Dissolve 1 tablet in 10 mL H20 and swallow up to QID PRN soreness with swallowing, Normal    iron polysaccharides (NIFEREX) 150 MG capsule Take 1 capsule (150 mg total) by mouth daily., Starting 07/03/2014, Until Discontinued, Print    pantoprazole (PROTONIX) 40 MG tablet Take 1 tablet (40 mg total) by mouth daily at 6 (six) AM., Starting 07/03/2014, Until Discontinued, Print      STOP taking these medications     doxycycline (VIBRAMYCIN) 100 MG capsule      methylPREDNISolone (MEDROL DOSEPAK) 4 MG tablet      metoprolol succinate (TOPROL-XL) 25 MG 24 hr tablet        Allergies  Allergen Reactions  . Keflex [Cephalexin]  Rash   Follow-up Information    Follow up with GARBA,LAWAL, MD. Schedule an appointment as soon as possible for a visit in 1 week.   Specialty:  Internal Medicine   Contact information:   [phone]     Follow up with MOEilleen Kempf MD.   Specialty:  Oncology   Why:  f/u as scheduled   Contact information:   [phone]     Follow up with COScharlene GlossMD. Schedule an appointment as soon as possible for a visit in 1 month.   Specialty:  Infectious Diseases   Contact information:   [phone]       Follow up with GABarbette MerinoMD.   Specialty:  Internal Medicine   Why:  Appt is on May 6 ar 11am with Dr.Garba   Contact information:   [phone]  Please follow up.   Why:  Pt has an appt in 1 month with Dr. Lorna Few previously scheduled.She is aware of the appt.      Follow up with Scharlene Gloss, MD On 10/04/2014.   Specialty:  Infectious Diseases   Why:  Appt with Dr.Comer is Thursday June 2 at [facility] information:   [address] [place] [address] [phone]        The results of significant diagnostics from this hospitalization (including imaging, microbiology, ancillary and laboratory) are listed below for reference.    Significant Diagnostic Studies: Ct Angio Chest Pe W/cm &/or Wo Cm  09/02/2014   CLINICAL DATA:  Known squamous cell carcinoma, status post chemotherapy and radiation therapy. Fever, chest pain and cough. Initial encounter.  EXAM: CT ANGIOGRAPHY CHEST WITH CONTRAST  TECHNIQUE: Multidetector CT imaging of the chest was performed using the standard protocol during bolus administration of intravenous contrast. Multiplanar CT image reconstructions and MIPs were obtained to evaluate the vascular anatomy.  CONTRAST:  142m OMNIPAQUE  IOHEXOL 350 MG/ML SOLN  COMPARISON:  CTA of the chest performed 08/14/2014, and chest radiograph performed 09/01/2014  FINDINGS: There is no evidence of pulmonary embolus.  The patient's large right upper lobe mass is relatively stable in size, measuring approximately 7.8 cm. There is significantly increased cavitation, and underlying fungal infection cannot be entirely excluded. New patchy airspace opacification within the periphery of the right upper lobe raises concern for superimposed infection, possibly atypical in nature.  Additional mild patchy opacity is noted within the central portions of the lungs bilaterally, concerning for pneumonia. Mild interstitial prominence is seen. Emphysematous change is noted bilaterally, more prominent at the left upper lobe. No pleural effusion or pneumothorax is seen.  A 1.7 cm right hilar node is seen. No mediastinal lymphadenopathy is appreciated. No pericardial effusion is identified. The great vessels are grossly unremarkable in appearance. No axillary lymphadenopathy is seen. The visualized portions of the thyroid gland are unremarkable in appearance.  A tiny hiatal hernia is noted.  The visualized portions of the liver and spleen are unremarkable. The visualized portions of the pancreas, gallbladder, stomach, adrenal glands and kidneys are within normal limits.  No acute osseous abnormalities are seen.  Review of the MIP images confirms the above findings.  IMPRESSION: 1. No evidence of pulmonary embolus. 2. Large right upper lobe lung mass is relatively stable in size, measuring 7.8 cm. Significantly increased cavitation. Given its appearance, underlying fungal infection cannot be entirely excluded. 3. New patchy airspace opacification in the periphery of the right upper lobe raises concern for superimposed infection, possibly atypical in nature. Additional mild patchy opacity noted in the central portions of the lungs bilaterally, also concerning for pneumonia. 4.  Mild interstitial prominence seen. Mild emphysematous change noted bilaterally, more prominent at the left upper lobe. 5. 1.7 cm right hilar node is concerning for nodal metastasis, similar in appearance to the prior study. 6. Tiny hiatal hernia seen.   Electronically Signed   By: JGarald BaldingM.D.   On: 09/02/2014 02:32   Ct Angio Chest Pe W/cm &/or Wo Cm  08/14/2014   CLINICAL DATA:  Chest pain and shortness of breath. Recent diagnosis of right-sided lung cancer. Received chemotherapy and radiation this morning.  EXAM: CT ANGIOGRAPHY CHEST WITH CONTRAST  TECHNIQUE: Multidetector CT imaging of the chest was performed using the standard protocol during bolus administration of intravenous contrast. Multiplanar CT image reconstructions and MIPs were obtained to evaluate  the vascular anatomy.  CONTRAST:  121m OMNIPAQUE IOHEXOL 350 MG/ML SOLN  COMPARISON:  Head CT 07/11/2014, chest CT 06/28/2014  FINDINGS: There are no filling defects within the pulmonary arteries to suggest pulmonary embolus.  The thoracic aorta is normal in caliber without aneurysm or dissection. There is a conventional branching pattern from the aortic arch. There is no pleural or pericardial effusion. No mediastinal adenopathy.  Right upper lobe mass currently measures 7.8 x 7.5 cm, slightly decreased in size from prior PET where it measured 10 x 10.2 cm. There is some internal cavitation and necrosis since prior exam. No adjacent consolidation or pneumonia. No new airspace disease. The tiny subpleural nodule in the right middle lobe is not as well seen on the current exam, consistent with benign process. No new pulmonary nodules are mass. Mild apical predominant emphysema.  No acute abnormality in the included upper abdomen. There are no acute or suspicious osseous abnormalities. No focal osseous lesion.  Review of the MIP images confirms the above findings.  IMPRESSION: 1. No pulmonary embolus. 2. Slight decrease size of right upper lobe  neoplasm with internal cavitation/necrosis. No new nodule or acute pulmonary process.   Electronically Signed   By: MJeb LeveringM.D.   On: 08/14/2014 01:27   Dg Chest Port 1 View  09/01/2014   CLINICAL DATA:  Acute onset of fever. Current history of lung cancer, status post chemotherapy and radiation therapy. Right-sided chest pain. Initial encounter.  EXAM: PORTABLE CHEST - 1 VIEW  COMPARISON:  CT of the chest performed 08/03/2014, and chest radiograph performed 08/13/2014  FINDINGS: There appears to be increased necrosis involving the patient's right upper lobe lung mass, with underlying right upper lobe airspace opacification raising concern for postobstructive pneumonia given the patient's symptoms. The left lung appears clear. No pleural effusion or pneumothorax is seen.  The cardiomediastinal silhouette is normal in size. No acute osseous abnormalities are identified.  IMPRESSION: Increased necrosis involving the patient's right upper lobe lung mass, status post chemotherapy and radiation therapy, with underlying right upper lobe airspace opacification raising concern for postobstructive pneumonia, given the patient's symptoms.   Electronically Signed   By: JGarald BaldingM.D.   On: 09/01/2014 22:09   Dg Chest Port 1 View  08/13/2014   CLINICAL DATA:  Hypertension, chest pain, lung cancer. Weakness, shortness of breath and dizziness. Status post chemotherapy and radiation today.  EXAM: PORTABLE CHEST - 1 VIEW  COMPARISON:  Most recent chest imaging PET-CT 07/11/2014  FINDINGS: Right upper lobe mass measuring at least 8.5 cm. The lungs are otherwise clear. Cardiomediastinal contours are normal. No pleural effusion or pneumothorax. No acute osseous abnormalities are seen. Pulmonary vasculature is normal.  IMPRESSION: 1. Right upper lobe mass. 2. No superimposed acute process.   Electronically Signed   By: MJeb LeveringM.D.   On: 08/13/2014 21:26    Microbiology: Recent Results (from the past  240 hour(s))  Blood Culture (routine x 2)     Status: None (Preliminary result)   Collection Time: 09/01/14  9:40 PM  Result Value Ref Range Status   Specimen Description BLOOD LEFT ANTECUBITAL  Final   Special Requests BOTTLES DRAWN AEROBIC AND ANAEROBIC 5CC  Final   Culture   Final           BLOOD CULTURE RECEIVED NO GROWTH TO DATE CULTURE WILL BE HELD FOR 5 DAYS BEFORE ISSUING A FINAL NEGATIVE REPORT [facility]   Report Status PENDING  Incomplete  Blood Culture (routine x 2)     Status: None (Preliminary result)   Collection Time: 09/01/14  9:45 PM  Result Value Ref Range Status   Specimen Description BLOOD LEFT HAND  Final   Special Requests BOTTLES DRAWN AEROBIC AND ANAEROBIC 5CC  Final   Culture   Final           BLOOD CULTURE RECEIVED NO GROWTH TO DATE CULTURE WILL BE HELD FOR 5 DAYS BEFORE ISSUING A FINAL NEGATIVE REPORT Performed at [facility]    Report Status PENDING  Incomplete  Urine culture     Status: None   Collection Time: 09/01/14 10:48 PM  Result Value Ref Range Status   Specimen Description URINE, RANDOM  Final   Special Requests NONE  Final   Colony Count NO GROWTH Performed at [facility]   Final   Culture NO GROWTH Performed at [facility]   Final   Report Status 09/02/2014 FINAL  Final  MRSA PCR Screening     Status: None   Collection Time: 09/02/14  8:33 AM  Result Value Ref Range Status   MRSA by PCR NEGATIVE NEGATIVE Final    Comment:        The GeneXpert MRSA Assay (FDA approved for NASAL specimens only), is one component of a comprehensive MRSA colonization surveillance program. It is not intended to diagnose MRSA infection nor to guide or monitor treatment for MRSA infections.   Culture, sputum-assessment     Status: None   Collection Time: 09/03/14  4:52 PM  Result Value Ref Range Status   Specimen Description Expect. Sput  Final   Special Requests Normal  Final   Sputum evaluation    Final    MICROSCOPIC FINDINGS SUGGEST THAT THIS SPECIMEN IS NOT REPRESENTATIVE OF LOWER RESPIRATORY SECRETIONS. PLEASE RECOLLECT. CALLED TO P.WEST RN AT1800 ON 5.2.16 BY W.BUCHHOLZ.   Report Status 09/03/2014 FINAL  Final     Labs: Basic Metabolic Panel:  Recent Labs Lab 09/02/14 0906 09/03/14 0407 09/04/14 0353 09/05/14 0403 09/06/14 0355  NA 137 133* 136 136 134*  K 3.4* 3.4* 4.0 3.9 3.4*  CL 109 104 104 101 101  CO2 '23 23 27 26 26  '$ GLUCOSE 86 92 118* 101* 125*  BUN '6 6 8 7 8  '$ CREATININE 0.49 0.58 0.69 0.54 0.55  CALCIUM 7.8* 8.0* 8.1* 8.4* 8.3*  MG  --  1.5* 2.2 2.0 2.0   Liver Function Tests:  Recent Labs Lab 09/01/14 2148 09/02/14 0906  AST 17 11*  ALT 15 11*  ALKPHOS 72 58  BILITOT 0.5 1.2  PROT 6.2 5.6*  ALBUMIN 2.6* 2.2*   No results for input(s): LIPASE, AMYLASE in the last 168 hours. No results for input(s): AMMONIA in the last 168 hours. CBC:  Recent Labs Lab 09/01/14 2148 09/01/14 2253 09/02/14 1508 09/03/14 0407 09/04/14 0353 09/06/14 0355  WBC 9.4  --  8.5 7.7 6.8 8.2  NEUTROABS 7.0  --   --   --   --   --   HGB 3.0* 5.1* 12.0 12.1 12.4 11.5*  HCT 9.1* 15.5* 36.1 36.4 38.2 36.4  MCV 87.5  --  84.3 83.7 86.2 87.9  PLT 376  --  224 253 251 254   Cardiac Enzymes: No results for input(s): CKTOTAL, CKMB, CKMBINDEX, TROPONINI in the last 168 hours. BNP: BNP (last 3 results) No results for input(s): BNP in the last 8760 hours.  ProBNP (last 3 results) No results for input(s):  PROBNP in the last 8760 hours.  CBG: No results for input(s): GLUCAP in the last 168 hours.     SignedIrine Seal MD [facility] 09/06/2014, 5:38 PM

## 2014-09-06 NOTE — Progress Notes (Signed)
Nursing Discharge Summary  Patient ID: Barbara Frost MRN: [id number] DOB/AGE: 01-28-1971 44 y.o.  Admit date: 09/01/2014 Discharge date: 09/06/2014  Discharged Condition: good  Disposition: 01-Home or Self Care  Follow-up Information    Follow up with [facility], MD. Schedule an appointment as soon as possible for a visit in 1 week.   Specialty:  Internal Medicine   Contact information:   [address] [place] [address] [phone]       Follow up with Eilleen Kempf., MD.   Specialty:  Oncology   Why:  f/u as scheduled   Contact information:   [place] [address] [phone]       Follow up with Scharlene Gloss, MD. Schedule an appointment as soon as possible for a visit in 1 month.   Specialty:  Infectious Diseases   Contact information:   [address] [address] [phone]       Follow up with [name], MD.   Specialty:  Internal Medicine   Why:  Appt is on May 6 ar 11am with Dr.Garba   Contact information:   [address] [address] [phone]       Please follow up.   Why:  Pt has an appt in 1 month with Dr. Lorna Few previously scheduled.She is aware of the appt.      Follow up with Scharlene Gloss, MD On 10/04/2014.   Specialty:  Infectious Diseases   Why:  Appt with Dr.Comer is Thursday June 2 at [facility] information:   [address] [address] [phone]       Prescriptions Given: Prescriptions given and information given to patient for the [facility] program. Patient verbalized understanding without further questions. Follow up appointments scheduled and discussed.  Means of Discharge: patient to be transported home via private vehicle with mother.   Signed: Buel Ream 09/06/2014, 2:06 PM

## 2014-09-06 NOTE — Care Management Note (Signed)
Case Management Note  Patient Details  Name: Barbara Frost MRN: [id number] Date of Birth: 1970/08/31  Additional Comments: Pt expressed to MD and RN that she is unable to afford her discharge prescriptions. Pt with current PCP. Pt entered into [employer] and given Match Letter with participating pharmacies. Pt appreciative of CM assistance.   Lynnell Catalan, RN 09/06/2014, 12:19 PM

## 2014-09-08 LAB — CULTURE, BLOOD (ROUTINE X 2)
CULTURE: NO GROWTH
Culture: NO GROWTH

## 2014-09-21 ENCOUNTER — Encounter: Payer: Self-pay | Admitting: Radiation Oncology

## 2014-09-24 ENCOUNTER — Ambulatory Visit (HOSPITAL_COMMUNITY)
Admission: RE | Admit: 2014-09-24 | Discharge: 2014-09-24 | Disposition: A | Discharge status: Home / Self Care | Payer: Medicaid Other | Source: Ambulatory Visit | Attending: Oncology | Admitting: Oncology

## 2014-09-24 ENCOUNTER — Encounter (HOSPITAL_COMMUNITY): Payer: Self-pay

## 2014-09-24 DIAGNOSIS — C3491 Malignant neoplasm of unspecified part of right bronchus or lung: Secondary | ICD-10-CM

## 2014-09-24 DIAGNOSIS — Z923 Personal history of irradiation: Secondary | ICD-10-CM | POA: Diagnosis not present

## 2014-09-24 DIAGNOSIS — Z85118 Personal history of other malignant neoplasm of bronchus and lung: Secondary | ICD-10-CM | POA: Diagnosis not present

## 2014-09-24 DIAGNOSIS — Z9221 Personal history of antineoplastic chemotherapy: Secondary | ICD-10-CM | POA: Diagnosis not present

## 2014-09-24 MED ORDER — IOHEXOL 300 MG/ML  SOLN
100.0000 mL | Freq: Once | INTRAMUSCULAR | Status: AC | PRN
  Administered 2014-09-24: 80 mL via INTRAVENOUS

## 2014-09-25 ENCOUNTER — Ambulatory Visit
Admission: RE | Admit: 2014-09-25 | Discharge: 2014-09-25 | Disposition: A | Discharge status: Home / Self Care | Payer: Self-pay | Source: Ambulatory Visit | Attending: Radiation Oncology | Admitting: Radiation Oncology

## 2014-09-25 ENCOUNTER — Encounter: Payer: Self-pay | Admitting: Radiation Oncology

## 2014-09-25 VITALS — BP 116/78 | HR 66 | Temp 98.2°F | Ht 60.0 in | Wt 120.6 lb

## 2014-09-25 DIAGNOSIS — C3491 Malignant neoplasm of unspecified part of right bronchus or lung: Secondary | ICD-10-CM

## 2014-09-25 HISTORY — DX: Personal history of irradiation: Z92.3

## 2014-09-25 NOTE — Progress Notes (Signed)
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  She states that her esophagitis is much improved and she has a good appetite.  However, her weight is down 16 pounds over the past month.  She attributes this weight loss to a six-day hospitalization for a fever of 103.   She had a follow-up CT scan of the chest on 09/24/2014 which showed decrease in size of the cavitary right upper lobe mass along with decrease in size of right suprahilar and pretracheal lymph nodes.  She denies any change in her baseline dyspnea.  Physical examination: Alert and oriented. Filed Vitals:   09/25/14 1045  BP: 116/78  Pulse: 66  Temp: 98.2 F (36.8 C)   Head and neck examination: Grossly unremarkable.  Nodes: Without palpable cervical, supraclavicular or infraclavicular lymphadenopathy.  Chest: Slightly diminished breath sounds along the right upper lung zone.  Lungs otherwise clear.  Heart: Regular rate and rhythm.  Laboratory data: Lab Results  Component Value Date   WBC 8.2 09/06/2014   HGB 11.5* 09/06/2014   HCT 36.4 09/06/2014   MCV 87.9 09/06/2014   PLT 254 09/06/2014    Impression: Satisfactory progress with partial response based on her recent CT scan of the chest  She will see Dr. Inda Merlin for a follow-up visit on May 31.  Plan: As above.  Follow-up visit with me in 4 months.

## 2014-09-25 NOTE — Progress Notes (Signed)
Ms. Welle reports that she has intermittent pain in her right chest/breast region, but denies pain presently and reports that she is eating better.  She had a temp of 103 1 week ago and her mother is concerned.  She reports an increase in appetite, and reports decreased discomfort when swallowing.

## 2014-10-02 ENCOUNTER — Telehealth: Payer: Self-pay | Admitting: Internal Medicine

## 2014-10-02 ENCOUNTER — Ambulatory Visit (HOSPITAL_BASED_OUTPATIENT_CLINIC_OR_DEPARTMENT_OTHER): Payer: Medicaid Other | Admitting: Internal Medicine

## 2014-10-02 ENCOUNTER — Other Ambulatory Visit (HOSPITAL_BASED_OUTPATIENT_CLINIC_OR_DEPARTMENT_OTHER): Payer: Medicaid Other

## 2014-10-02 ENCOUNTER — Telehealth: Payer: Self-pay | Admitting: *Deleted

## 2014-10-02 ENCOUNTER — Encounter: Payer: Self-pay | Admitting: Internal Medicine

## 2014-10-02 VITALS — BP 112/69 | HR 109 | Temp 98.8°F | Resp 17 | Ht 60.0 in | Wt 121.4 lb

## 2014-10-02 DIAGNOSIS — R509 Fever, unspecified: Secondary | ICD-10-CM | POA: Diagnosis not present

## 2014-10-02 DIAGNOSIS — C3411 Malignant neoplasm of upper lobe, right bronchus or lung: Secondary | ICD-10-CM

## 2014-10-02 DIAGNOSIS — C3490 Malignant neoplasm of unspecified part of unspecified bronchus or lung: Secondary | ICD-10-CM

## 2014-10-02 DIAGNOSIS — C3491 Malignant neoplasm of unspecified part of right bronchus or lung: Secondary | ICD-10-CM

## 2014-10-02 LAB — CBC WITH DIFFERENTIAL/PLATELET
BASO%: 0.4 % (ref 0.0–2.0)
Basophils Absolute: 0 10*3/uL (ref 0.0–0.1)
EOS%: 0.9 % (ref 0.0–7.0)
Eosinophils Absolute: 0.1 10*3/uL (ref 0.0–0.5)
HCT: 34.2 % — ABNORMAL LOW (ref 34.8–46.6)
HGB: 11.3 g/dL — ABNORMAL LOW (ref 11.6–15.9)
LYMPH#: 1.1 10*3/uL (ref 0.9–3.3)
LYMPH%: 10.4 % — ABNORMAL LOW (ref 14.0–49.7)
MCH: 29.1 pg (ref 25.1–34.0)
MCHC: 33 g/dL (ref 31.5–36.0)
MCV: 88.2 fL (ref 79.5–101.0)
MONO#: 0.8 10*3/uL (ref 0.1–0.9)
MONO%: 7.9 % (ref 0.0–14.0)
NEUT#: 8.3 10*3/uL — ABNORMAL HIGH (ref 1.5–6.5)
NEUT%: 80.4 % — AB (ref 38.4–76.8)
Platelets: 421 10*3/uL — ABNORMAL HIGH (ref 145–400)
RBC: 3.88 10*6/uL (ref 3.70–5.45)
RDW: 18.8 % — AB (ref 11.2–14.5)
WBC: 10.4 10*3/uL — ABNORMAL HIGH (ref 3.9–10.3)

## 2014-10-02 LAB — COMPREHENSIVE METABOLIC PANEL (CC13)
ALBUMIN: 2.3 g/dL — AB (ref 3.5–5.0)
ALK PHOS: 94 U/L (ref 40–150)
ALT: 36 U/L (ref 0–55)
AST: 28 U/L (ref 5–34)
Anion Gap: 9 mEq/L (ref 3–11)
BUN: 8.2 mg/dL (ref 7.0–26.0)
CALCIUM: 8.8 mg/dL (ref 8.4–10.4)
CO2: 26 mEq/L (ref 22–29)
CREATININE: 0.7 mg/dL (ref 0.6–1.1)
Chloride: 103 mEq/L (ref 98–109)
GLUCOSE: 114 mg/dL (ref 70–140)
Potassium: 3.9 mEq/L (ref 3.5–5.1)
Sodium: 138 mEq/L (ref 136–145)
Total Bilirubin: 0.55 mg/dL (ref 0.20–1.20)
Total Protein: 7.6 g/dL (ref 6.4–8.3)

## 2014-10-02 MED ORDER — LEVOFLOXACIN 500 MG PO TABS: 500.0000 mg | ORAL_TABLET | Freq: Every day | ORAL | Status: DC

## 2014-10-02 NOTE — Telephone Encounter (Signed)
Gave avs & calendar for June. Sent message to schedule treatment. °

## 2014-10-02 NOTE — Telephone Encounter (Signed)
Per staff message and POF I have scheduled appts. Advised scheduler of appts and to move labs. JMW  

## 2014-10-02 NOTE — Progress Notes (Signed)
[facility] [phone]   Fax:([phone] [place], [place] [address]  DIAGNOSIS: Locally advanced questionable for a stage IIIA (T3, N2, M0) non-small cell lung cancer, squamous cell carcinoma diagnosed in February 2016 presented with large right upper lobe lung mass with mediastinal invasion.  PRIOR THERAPY: Concurrent chemoradiation with weekly carboplatin for AUC of 2 and paclitaxel 45 MG/M2. First dose 07/16/2014. Status post 6 cycles of chemotherapy last dose was given 08/20/2014 with partial response.  CURRENT THERAPY: Consolidation chemotherapy with carboplatin for AUC of 5 on day 1 and gemcitabine 1000 MG/M2 on days 1 and 8 every 3 weeks. First dose 10/08/2014.  INTERVAL HISTORY: Barbara Frost 44 y.o. female returns to the clinic today for hospital follow-up visit accompanied by her mother. The patient related to her previous course of concurrent chemoradiation fairly well with no significant adverse effect except for mild fatigue and low-grade fever that has been going on for a few weeks questionable for tumor necrosis versus postobstructive pneumonia. She was recently treated at [facility] for postobstructive pneumonia as well as severe anemia from menorrhagia. The patient is feeling much better today except for the persistent low-grade fever. She denied having any significant chills. She does not look toxic. The patient denied having any significant chest pain but has shortness breath with exertion with cough productive of greenish sputum. She denied having any significant weight loss or night sweats. She had repeat CT scan of the chest performed recently and she is here for evaluation and discussion of her scan results.  MEDICAL HISTORY: Past Medical History  Diagnosis Date  . Squamous cell carcinoma of right lung 06/29/14  . S/P radiation therapy 07/10/2014 through  08/20/2014     Right lung/mediastinum 6000 cGy in 30 sessions     ALLERGIES:  is allergic to keflex.  MEDICATIONS:  Current Outpatient Prescriptions  Medication Sig Dispense Refill  . albuterol (PROVENTIL HFA;VENTOLIN HFA) 108 (90 BASE) MCG/ACT inhaler Inhale 1-2 puffs into the lungs every 6 (six) hours as needed for wheezing or shortness of breath. Use 2 puffs 3 times daily x 4 days, then every 6 hours as needed. 1 Inhaler 0  . aspirin 325 MG EC tablet Take 325 mg by mouth daily.     Marland Kitchen ibuprofen (ADVIL,MOTRIN) 600 MG tablet Take 1 tablet (600 mg total) by mouth 4 (four) times daily. Take for 4 days then use as needed. 30 tablet 0  . oxyCODONE-acetaminophen (PERCOCET/ROXICET) 5-325 MG per tablet Take 1 tablet by mouth every 6 (six) hours as needed for severe pain. 30 tablet 0  . docusate sodium (COLACE) 100 MG capsule Take 1 capsule (100 mg total) by mouth 2 (two) times daily. (Patient not taking: Reported on 09/25/2014) 10 capsule 0  . pantoprazole (PROTONIX) 40 MG tablet Take 1 tablet (40 mg total) by mouth daily at 6 (six) AM. (Patient not taking: Reported on 10/02/2014) 30 tablet 0  . prochlorperazine (COMPAZINE) 10 MG tablet Take 1 tablet (10 mg total) by mouth every 6 (six) hours as needed for nausea or vomiting. (Patient not taking: Reported on 09/25/2014) 30 tablet 0  . sucralfate (CARAFATE) 1 G tablet Dissolve 1 tablet in 10 mL H20 and swallow up to QID PRN soreness with swallowing (Patient not taking: Reported on 09/25/2014) 60 tablet 4   No current facility-administered medications for this visit.    SURGICAL HISTORY:  Past Surgical History  Procedure  Laterality Date  . Femur closed reduction Right     REVIEW OF SYSTEMS:  Constitutional: positive for fatigue and fevers Eyes: negative Ears, nose, mouth, throat, and face: negative Respiratory: positive for cough and dyspnea on exertion Cardiovascular:  negative Gastrointestinal: negative Genitourinary:negative Integument/breast: negative Hematologic/lymphatic: negative Musculoskeletal:negative Neurological: negative Behavioral/Psych: negative Endocrine: negative Allergic/Immunologic: negative   PHYSICAL EXAMINATION: General appearance: alert, cooperative, fatigued and no distress Head: Normocephalic, without obvious abnormality, atraumatic Neck: no adenopathy, no JVD, supple, symmetrical, trachea midline and thyroid not enlarged, symmetric, no tenderness/mass/nodules Lymph nodes: Cervical, supraclavicular, and axillary nodes normal. Resp: diminished breath sounds RUL and dullness to percussion RUL Back: symmetric, no curvature. ROM normal. No CVA tenderness. Cardio: regular rate and rhythm, S1, S2 normal, no murmur, click, rub or gallop GI: soft, non-tender; bowel sounds normal; no masses,  no organomegaly Extremities: extremities normal, atraumatic, no cyanosis or edema Neurologic: Alert and oriented X 3, normal strength and tone. Normal symmetric reflexes. Normal coordination and gait  ECOG PERFORMANCE STATUS: 1 - Symptomatic but completely ambulatory  Blood pressure 112/69, pulse 109, temperature 98.8 F (37.1 C), temperature source Oral, resp. rate 17, height 5' (1.524 m), weight 121 lb 6.4 oz (55.067 kg), last menstrual period 08/05/2014, SpO2 99 %.  LABORATORY DATA: Lab Results  Component Value Date   WBC 10.4* 10/02/2014   HGB 11.3* 10/02/2014   HCT 34.2* 10/02/2014   MCV 88.2 10/02/2014   PLT 421* 10/02/2014      Chemistry      Component Value Date/Time   NA 138 10/02/2014 1328   NA 134* 09/06/2014 0355   K 3.9 10/02/2014 1328   K 3.4* 09/06/2014 0355   CL 101 09/06/2014 0355   CO2 26 10/02/2014 1328   CO2 26 09/06/2014 0355   BUN 8.2 10/02/2014 1328   BUN 8 09/06/2014 0355   CREATININE 0.7 10/02/2014 1328   CREATININE 0.55 09/06/2014 0355      Component Value Date/Time   CALCIUM 8.8 10/02/2014 1328    CALCIUM 8.3* 09/06/2014 0355   ALKPHOS 94 10/02/2014 1328   ALKPHOS 58 09/02/2014 0906   AST 28 10/02/2014 1328   AST 11* 09/02/2014 0906   ALT 36 10/02/2014 1328   ALT 11* 09/02/2014 0906   BILITOT 0.55 10/02/2014 1328   BILITOT 1.2 09/02/2014 0906       RADIOGRAPHIC STUDIES: Ct Chest W Contrast  09/24/2014   CLINICAL DATA:  Restaging right upper lobe non-small-cell lung cancer diagnosed March 2016. Completed chemotherapy and radiation 09/19/2014. Shortness of breath and wheezing  EXAM: CT CHEST WITH CONTRAST  TECHNIQUE: Multidetector CT imaging of the chest was performed during intravenous contrast administration.  CONTRAST:  74m OMNIPAQUE IOHEXOL 300 MG/ML  SOLN  COMPARISON:  09/02/2014, 06/28/2014  FINDINGS: Mediastinum/Nodes: Pretracheal node has decreased in size, now 0.4 cm image 17. Right suprahilar contiguous mass involvement versus lymph node measuring 1.1 cm image 23 is decreased in size from previous measurement 1.7 cm. Continued obliteration of multiple right upper lobe bronchi causing compressive adjacent atelectasis/collapse of right upper lobe reidentified. Great vessels are normal in caliber.  Heart size is normal.  Trace pericardial fluid.  Lungs/Pleura: Slight decrease in size of dominant right upper lobe partly cavitary mass with solid portion now measuring 7.4 x 6.8 cm image 16 compared to 7.8 x 7.8 cm measurement on prior exam. Nodular right apical/ upper lobe pleural thickening reidentified. Emphysematous changes are noted bilaterally. No other pulmonary nodule, mass, or consolidation is identified.  Upper abdomen: Adrenal  glands appear normal.  Musculoskeletal: Mild endplate disc degenerative change noted at the lower thoracic/upper lumbar spine but no new lytic or sclerotic osseous lesion or acute osseous abnormality is identified.  IMPRESSION: Decrease in size of cavitary right upper lobe mass compatible with provided history of non-small-cell lung cancer.  Decrease in size  of right suprahilar and pretracheal lymph nodes.  Findings are compatible with response to treatment.  No new acute intrapulmonary process.   Electronically Signed   By: Conchita Paris M.D.   On: 09/24/2014 08:42    ASSESSMENT AND PLAN: This is a very pleasant 44 years old white female recently diagnosed with at least a stage IIIa (T3, N2, M0) non-small cell lung cancer, squamous cell carcinoma presented with large right upper lobe mass with questionable mediastinal invasion diagnosed in February 2016. The patient underwent a course of concurrent chemoradiation with weekly carboplatin and paclitaxel and tolerated her treatment fairly well. The recent CT scan of the chest showed decrease in the size of the cavitary right upper lobe mass as well as decrease in the size of the right suprahilar and pretracheal lymph nodes consistent with response to the treatment. I had a lengthy discussion with the patient and her mother today about her current disease status and treatment options. I showed them the images of the CT scan. I discussed with the patient consolidation chemotherapy with either carboplatin for AUC of 5 and paclitaxel 175 MG/M2 every 3 weeks with Neulasta support versus treatment with carboplatin for AUC of 5 on day 1 and gemcitabine 1000 MG/M2 on days 1 and 8 every 3 weeks. I discussed the adverse effect of both options with the patient. She is concerned about the hair loss with the first regimen and she preferred to proceed with treatment with carboplatin and gemcitabine. She is expected to start the first cycle of this treatment on 10/08/2014. The patient would come back for follow-up visit in 4 weeks for reevaluation before starting cycle #2. For the persistent low-grade fever which is concerning for tumor necrosis versus postobstructive pneumonia, I started the patient on empiric treatment with Levaquin 500 mg by mouth daily for 7 days. She was advised to call immediately if she has any  concerning symptoms in the interval. The patient voices understanding of current disease status and treatment options and is in agreement with the current care plan.  All questions were answered. The patient knows to call the clinic with any problems, questions or concerns. We can certainly see the patient much sooner if necessary.  I spent 15 minutes counseling the patient face to face. The total time spent in the appointment was 25 minutes.  Disclaimer: This note was dictated with voice recognition software. Similar sounding words can inadvertently be transcribed and may not be corrected upon review.

## 2014-10-04 ENCOUNTER — Inpatient Hospital Stay: Payer: Self-pay | Admitting: Internal Medicine

## 2014-10-08 ENCOUNTER — Ambulatory Visit (HOSPITAL_BASED_OUTPATIENT_CLINIC_OR_DEPARTMENT_OTHER): Payer: Medicaid Other

## 2014-10-08 ENCOUNTER — Other Ambulatory Visit (HOSPITAL_BASED_OUTPATIENT_CLINIC_OR_DEPARTMENT_OTHER): Payer: Medicaid Other

## 2014-10-08 VITALS — BP 96/57 | HR 20 | Temp 98.5°F | Resp 20

## 2014-10-08 DIAGNOSIS — Z5111 Encounter for antineoplastic chemotherapy: Secondary | ICD-10-CM

## 2014-10-08 DIAGNOSIS — C3491 Malignant neoplasm of unspecified part of right bronchus or lung: Secondary | ICD-10-CM

## 2014-10-08 DIAGNOSIS — C3411 Malignant neoplasm of upper lobe, right bronchus or lung: Secondary | ICD-10-CM

## 2014-10-08 DIAGNOSIS — C3490 Malignant neoplasm of unspecified part of unspecified bronchus or lung: Secondary | ICD-10-CM

## 2014-10-08 LAB — CBC WITH DIFFERENTIAL/PLATELET
BASO%: 0.5 % (ref 0.0–2.0)
Basophils Absolute: 0 10e3/uL (ref 0.0–0.1)
EOS%: 2 % (ref 0.0–7.0)
Eosinophils Absolute: 0.2 10e3/uL (ref 0.0–0.5)
HCT: 30.8 % — ABNORMAL LOW (ref 34.8–46.6)
HGB: 10.2 g/dL — ABNORMAL LOW (ref 11.6–15.9)
LYMPH%: 11.1 % — ABNORMAL LOW (ref 14.0–49.7)
MCH: 29.1 pg (ref 25.1–34.0)
MCHC: 33.2 g/dL (ref 31.5–36.0)
MCV: 87.7 fL (ref 79.5–101.0)
MONO#: 0.7 10e3/uL (ref 0.1–0.9)
MONO%: 10 % (ref 0.0–14.0)
NEUT#: 5.7 10e3/uL (ref 1.5–6.5)
NEUT%: 76.4 % (ref 38.4–76.8)
Platelets: 422 10e3/uL — ABNORMAL HIGH (ref 145–400)
RBC: 3.51 10e6/uL — ABNORMAL LOW (ref 3.70–5.45)
RDW: 18 % — ABNORMAL HIGH (ref 11.2–14.5)
WBC: 7.5 10e3/uL (ref 3.9–10.3)
lymph#: 0.8 10e3/uL — ABNORMAL LOW (ref 0.9–3.3)

## 2014-10-08 LAB — COMPREHENSIVE METABOLIC PANEL (CC13)
ALT: 33 U/L (ref 0–55)
AST: 35 U/L — ABNORMAL HIGH (ref 5–34)
Albumin: 2.2 g/dL — ABNORMAL LOW (ref 3.5–5.0)
Alkaline Phosphatase: 82 U/L (ref 40–150)
Anion Gap: 12 meq/L — ABNORMAL HIGH (ref 3–11)
BUN: 5.7 mg/dL — ABNORMAL LOW (ref 7.0–26.0)
CO2: 24 meq/L (ref 22–29)
Calcium: 9.1 mg/dL (ref 8.4–10.4)
Chloride: 103 meq/L (ref 98–109)
Creatinine: 0.6 mg/dL (ref 0.6–1.1)
EGFR: >90 ml/min/1.73 m2
Glucose: 87 mg/dL (ref 70–140)
Potassium: 4.3 meq/L (ref 3.5–5.1)
Sodium: 139 meq/L (ref 136–145)
Total Bilirubin: 0.72 mg/dL (ref 0.20–1.20)
Total Protein: 7.4 g/dL (ref 6.4–8.3)

## 2014-10-08 MED ORDER — SODIUM CHLORIDE 0.9 % IV SOLN
Freq: Once | INTRAVENOUS | Status: AC
  Administered 2014-10-08: 09:00:00 via INTRAVENOUS

## 2014-10-08 MED ORDER — SODIUM CHLORIDE 0.9 % IV SOLN
Freq: Once | INTRAVENOUS | Status: AC
  Administered 2014-10-08: 09:00:00 via INTRAVENOUS
  Filled 2014-10-08: qty 8

## 2014-10-08 MED ORDER — SODIUM CHLORIDE 0.9 % IV SOLN
1000.0000 mg/m2 | Freq: Once | INTRAVENOUS | Status: AC
  Administered 2014-10-08: 1520 mg via INTRAVENOUS
  Filled 2014-10-08: qty 39.98

## 2014-10-08 MED ORDER — SODIUM CHLORIDE 0.9 % IV SOLN
520.0000 mg | Freq: Once | INTRAVENOUS | Status: AC
  Administered 2014-10-08: 520 mg via INTRAVENOUS
  Filled 2014-10-08: qty 52

## 2014-10-08 NOTE — Patient Instructions (Signed)
[facility] Discharge Instructions for Patients Receiving Chemotherapy  Today you received the following chemotherapy agents: Gemzar and Carboplatin   To help prevent nausea and vomiting after your treatment, we encourage you to take your nausea medication as directed.    If you develop nausea and vomiting that is not controlled by your nausea medication, call the clinic.   BELOW ARE SYMPTOMS THAT SHOULD BE REPORTED IMMEDIATELY:  *FEVER GREATER THAN 100.5 F  *CHILLS WITH OR WITHOUT FEVER  NAUSEA AND VOMITING THAT IS NOT CONTROLLED WITH YOUR NAUSEA MEDICATION  *UNUSUAL SHORTNESS OF BREATH  *UNUSUAL BRUISING OR BLEEDING  TENDERNESS IN MOUTH AND THROAT WITH OR WITHOUT PRESENCE OF ULCERS  *URINARY PROBLEMS  *BOWEL PROBLEMS  UNUSUAL RASH Items with * indicate a potential emergency and should be followed up as soon as possible.  Feel free to call the clinic you have any questions or concerns. The clinic phone number is [phone].  Please show the CHEMO ALERT CARD at check-in to the Emergency Department and triage nurse.   

## 2014-10-09 ENCOUNTER — Telehealth: Payer: Self-pay | Admitting: *Deleted

## 2014-10-09 NOTE — Telephone Encounter (Signed)
-----   Message from Cora Collum, RN sent at 10/08/2014  9:11 AM EDT ----- Regarding: Chemo follow up cal....Dr. Julien Nordmann 1st time Gemzar

## 2014-10-09 NOTE — Telephone Encounter (Signed)
Follow up call to check status of pt. Unable to reach. LMOVM for pt to call with any concerns.

## 2014-10-15 ENCOUNTER — Other Ambulatory Visit (HOSPITAL_BASED_OUTPATIENT_CLINIC_OR_DEPARTMENT_OTHER): Payer: Medicaid Other

## 2014-10-15 ENCOUNTER — Ambulatory Visit (HOSPITAL_BASED_OUTPATIENT_CLINIC_OR_DEPARTMENT_OTHER): Payer: Medicaid Other

## 2014-10-15 VITALS — BP 94/59 | HR 99 | Temp 98.3°F | Resp 18

## 2014-10-15 DIAGNOSIS — Z5111 Encounter for antineoplastic chemotherapy: Secondary | ICD-10-CM

## 2014-10-15 DIAGNOSIS — C3411 Malignant neoplasm of upper lobe, right bronchus or lung: Secondary | ICD-10-CM

## 2014-10-15 DIAGNOSIS — C3491 Malignant neoplasm of unspecified part of right bronchus or lung: Secondary | ICD-10-CM

## 2014-10-15 LAB — CBC WITH DIFFERENTIAL/PLATELET
BASO%: 0.6 % (ref 0.0–2.0)
Basophils Absolute: 0 10*3/uL (ref 0.0–0.1)
EOS%: 1.4 % (ref 0.0–7.0)
Eosinophils Absolute: 0.1 10*3/uL (ref 0.0–0.5)
HCT: 27.5 % — ABNORMAL LOW (ref 34.8–46.6)
HGB: 9.2 g/dL — ABNORMAL LOW (ref 11.6–15.9)
LYMPH%: 10.2 % — ABNORMAL LOW (ref 14.0–49.7)
MCH: 29 pg (ref 25.1–34.0)
MCHC: 33.6 g/dL (ref 31.5–36.0)
MCV: 86.4 fL (ref 79.5–101.0)
MONO#: 0.2 10*3/uL (ref 0.1–0.9)
MONO%: 4.5 % (ref 0.0–14.0)
NEUT#: 4.2 10*3/uL (ref 1.5–6.5)
NEUT%: 83.3 % — AB (ref 38.4–76.8)
Platelets: 254 10*3/uL (ref 145–400)
RBC: 3.18 10*6/uL — ABNORMAL LOW (ref 3.70–5.45)
RDW: 16.8 % — ABNORMAL HIGH (ref 11.2–14.5)
WBC: 5.1 10*3/uL (ref 3.9–10.3)
lymph#: 0.5 10*3/uL — ABNORMAL LOW (ref 0.9–3.3)

## 2014-10-15 LAB — COMPREHENSIVE METABOLIC PANEL (CC13)
ALT: 31 U/L (ref 0–55)
AST: 26 U/L (ref 5–34)
Albumin: 2.4 g/dL — ABNORMAL LOW (ref 3.5–5.0)
Alkaline Phosphatase: 93 U/L (ref 40–150)
Anion Gap: 12 mEq/L — ABNORMAL HIGH (ref 3–11)
BILIRUBIN TOTAL: 0.72 mg/dL (ref 0.20–1.20)
BUN: 7.8 mg/dL (ref 7.0–26.0)
CHLORIDE: 100 meq/L (ref 98–109)
CO2: 25 mEq/L (ref 22–29)
CREATININE: 0.6 mg/dL (ref 0.6–1.1)
Calcium: 9.3 mg/dL (ref 8.4–10.4)
EGFR: >90 mL/min/{1.73_m2} (ref >90)
Glucose: 131 mg/dl (ref 70–140)
Potassium: 3.5 mEq/L (ref 3.5–5.1)
SODIUM: 136 meq/L (ref 136–145)
Total Protein: 7.3 g/dL (ref 6.4–8.3)

## 2014-10-15 MED ORDER — SODIUM CHLORIDE 0.9 % IV SOLN
250.0000 mL | Freq: Once | INTRAVENOUS | Status: AC
  Administered 2014-10-15: 250 mL via INTRAVENOUS

## 2014-10-15 MED ORDER — GEMCITABINE HCL CHEMO INJECTION 1 GM/26.3ML
1000.0000 mg/m2 | Freq: Once | INTRAVENOUS | Status: AC
  Administered 2014-10-15: 1520 mg via INTRAVENOUS
  Filled 2014-10-15: qty 39.98

## 2014-10-15 MED ORDER — PROCHLORPERAZINE MALEATE 10 MG PO TABS
ORAL_TABLET | ORAL | Status: AC
  Filled 2014-10-15: qty 1

## 2014-10-15 MED ORDER — PROCHLORPERAZINE MALEATE 10 MG PO TABS
10.0000 mg | ORAL_TABLET | Freq: Once | ORAL | Status: AC
  Administered 2014-10-15: 10 mg via ORAL

## 2014-10-15 NOTE — Patient Instructions (Signed)
[facility] Discharge Instructions for Patients Receiving Chemotherapy  Today you received the following chemotherapy agents Gemzar  To help prevent nausea and vomiting after your treatment, we encourage you to take your nausea medication as directed.    If you develop nausea and vomiting that is not controlled by your nausea medication, call the clinic.   BELOW ARE SYMPTOMS THAT SHOULD BE REPORTED IMMEDIATELY:  *FEVER GREATER THAN 100.5 F  *CHILLS WITH OR WITHOUT FEVER  NAUSEA AND VOMITING THAT IS NOT CONTROLLED WITH YOUR NAUSEA MEDICATION  *UNUSUAL SHORTNESS OF BREATH  *UNUSUAL BRUISING OR BLEEDING  TENDERNESS IN MOUTH AND THROAT WITH OR WITHOUT PRESENCE OF ULCERS  *URINARY PROBLEMS  *BOWEL PROBLEMS  UNUSUAL RASH Items with * indicate a potential emergency and should be followed up as soon as possible.  Feel free to call the clinic you have any questions or concerns. The clinic phone number is [phone].  Please show the CHEMO ALERT CARD at check-in to the Emergency Department and triage nurse.   

## 2014-10-29 ENCOUNTER — Telehealth: Payer: Self-pay | Admitting: Physician Assistant

## 2014-10-29 ENCOUNTER — Ambulatory Visit (HOSPITAL_BASED_OUTPATIENT_CLINIC_OR_DEPARTMENT_OTHER): Payer: Medicaid Other | Admitting: Physician Assistant

## 2014-10-29 ENCOUNTER — Encounter: Payer: Self-pay | Admitting: Physician Assistant

## 2014-10-29 ENCOUNTER — Ambulatory Visit: Payer: Medicaid Other

## 2014-10-29 ENCOUNTER — Other Ambulatory Visit (HOSPITAL_BASED_OUTPATIENT_CLINIC_OR_DEPARTMENT_OTHER): Payer: Medicaid Other

## 2014-10-29 ENCOUNTER — Telehealth: Payer: Self-pay | Admitting: Internal Medicine

## 2014-10-29 VITALS — BP 112/66 | HR 101 | Temp 100.8°F | Resp 18 | Ht 60.0 in | Wt 118.1 lb

## 2014-10-29 DIAGNOSIS — C3491 Malignant neoplasm of unspecified part of right bronchus or lung: Secondary | ICD-10-CM | POA: Diagnosis not present

## 2014-10-29 DIAGNOSIS — C3490 Malignant neoplasm of unspecified part of unspecified bronchus or lung: Secondary | ICD-10-CM

## 2014-10-29 LAB — COMPREHENSIVE METABOLIC PANEL (CC13)
ALBUMIN: 2.5 g/dL — AB (ref 3.5–5.0)
ALT: 19 U/L (ref 0–55)
AST: 21 U/L (ref 5–34)
Alkaline Phosphatase: 92 U/L (ref 40–150)
Anion Gap: 11 mEq/L (ref 3–11)
BUN: 5.6 mg/dL — ABNORMAL LOW (ref 7.0–26.0)
CHLORIDE: 104 meq/L (ref 98–109)
CO2: 25 meq/L (ref 22–29)
CREATININE: 0.6 mg/dL (ref 0.6–1.1)
Calcium: 9.4 mg/dL (ref 8.4–10.4)
Glucose: 92 mg/dl (ref 70–140)
Potassium: 3.9 mEq/L (ref 3.5–5.1)
Sodium: 140 mEq/L (ref 136–145)
TOTAL PROTEIN: 7.8 g/dL (ref 6.4–8.3)
Total Bilirubin: 0.48 mg/dL (ref 0.20–1.20)

## 2014-10-29 LAB — CBC WITH DIFFERENTIAL/PLATELET
BASO%: 0.3 % (ref 0.0–2.0)
Basophils Absolute: 0 10*3/uL (ref 0.0–0.1)
EOS%: 0.4 % (ref 0.0–7.0)
Eosinophils Absolute: 0 10*3/uL (ref 0.0–0.5)
HCT: 27.1 % — ABNORMAL LOW (ref 34.8–46.6)
HGB: 8.7 g/dL — ABNORMAL LOW (ref 11.6–15.9)
LYMPH#: 0.7 10*3/uL — AB (ref 0.9–3.3)
LYMPH%: 10.1 % — ABNORMAL LOW (ref 14.0–49.7)
MCH: 28.4 pg (ref 25.1–34.0)
MCHC: 32.3 g/dL (ref 31.5–36.0)
MCV: 87.9 fL (ref 79.5–101.0)
MONO#: 1.5 10*3/uL — ABNORMAL HIGH (ref 0.1–0.9)
MONO%: 21.2 % — ABNORMAL HIGH (ref 0.0–14.0)
NEUT#: 4.8 10*3/uL (ref 1.5–6.5)
NEUT%: 68 % (ref 38.4–76.8)
Platelets: 753 10*3/uL — ABNORMAL HIGH (ref 145–400)
RBC: 3.08 10*6/uL — AB (ref 3.70–5.45)
RDW: 17.9 % — AB (ref 11.2–14.5)
WBC: 7 10*3/uL (ref 3.9–10.3)

## 2014-10-29 LAB — TECHNOLOGIST REVIEW

## 2014-10-29 MED ORDER — LEVOFLOXACIN 500 MG PO TABS: 500.0000 mg | ORAL_TABLET | Freq: Every day | ORAL | Status: DC

## 2014-10-29 NOTE — Progress Notes (Addendum)
[facility] [phone]   Fax:([phone]) [place], MD [place] [address]  DIAGNOSIS: Locally advanced questionable for a stage IIIA (T3, N2, M0) non-small cell lung cancer, squamous cell carcinoma diagnosed in February 2016 presented with large right upper lobe lung mass with mediastinal invasion.  PRIOR THERAPY: Concurrent chemoradiation with weekly carboplatin for AUC of 2 and paclitaxel 45 MG/M2. First dose 07/16/2014. Status post 6 cycles of chemotherapy last dose was given 08/20/2014 with partial response.  CURRENT THERAPY: Consolidation chemotherapy with carboplatin for AUC of 5 on day 1 and gemcitabine 1000 MG/M2 on days 1 and 8 every 3 weeks. First dose 10/08/2014.  INTERVAL HISTORY: Barbara Frost 44 y.o. female returns to the clinic today for a follow-up visit accompanied by her mother. She tolerated her first cycle relatively well. Today she presents complaining of cough that is productive of yellow/green/gray secretions. She states she currently feels as though she has a tickle in her throat. She reports a MAXIMUM TEMPERATURE at home of 102. She states she initially had a rash on her abdomen but states it resolved after using some gold blond products. She is wondering whether this was related to her recent change in laundry detergent. reported some right chest pain. She's also had some night sweats. She has been using Mucinex intermittently. The patient related to her previous course of concurrent chemoradiation fairly well with no significant adverse effect except for mild fatigue and low-grade fever that has been going on for a few weeks questionable for tumor necrosis versus postobstructive pneumonia. She was recently treated at [facility] for postobstructive pneumonia as well as severe anemia from menorrhagia. She is febrile in our office today with a temperature 100.1, rechecked and was  100.8.  She denied having any significant chills. She does not look toxic. She reports shortness breath with exertion and has some abdominal muscle tenderness from her coughing spells.   MEDICAL HISTORY: Past Medical History  Diagnosis Date  . Squamous cell carcinoma of right lung 06/29/14  . S/P radiation therapy 07/10/2014 through 08/20/2014     Right lung/mediastinum 6000 cGy in 30 sessions     ALLERGIES:  is allergic to keflex.  MEDICATIONS:  Current Outpatient Prescriptions  Medication Sig Dispense Refill  . albuterol (PROVENTIL HFA;VENTOLIN HFA) 108 (90 BASE) MCG/ACT inhaler Inhale 1-2 puffs into the lungs every 6 (six) hours as needed for wheezing or shortness of breath. Use 2 puffs 3 times daily x 4 days, then every 6 hours as needed. 1 Inhaler 0  . aspirin 325 MG EC tablet Take 325 mg by mouth daily.     Marland Kitchen ibuprofen (ADVIL,MOTRIN) 600 MG tablet Take 1 tablet (600 mg total) by mouth 4 (four) times daily. Take for 4 days then use as needed. 30 tablet 0  . oxyCODONE-acetaminophen (PERCOCET/ROXICET) 5-325 MG per tablet Take 1 tablet by mouth every 6 (six) hours as needed for severe pain. 30 tablet 0  . prochlorperazine (COMPAZINE) 10 MG tablet Take 1 tablet (10 mg total) by mouth every 6 (six) hours as needed for nausea or vomiting. 30 tablet 0  . docusate sodium (COLACE) 100 MG capsule Take 1 capsule (100 mg total) by mouth 2 (two) times daily. (Patient not taking: Reported on 10/29/2014) 10 capsule 0  . levofloxacin (LEVAQUIN) 500 MG tablet Take 1 tablet (500 mg total) by mouth daily. (Patient not taking: Reported on 10/29/2014) 7 tablet 0  .  levofloxacin (LEVAQUIN) 500 MG tablet Take 1 tablet (500 mg total) by mouth daily. 7 tablet 0  . pantoprazole (PROTONIX) 40 MG tablet Take 1 tablet (40 mg total) by mouth daily at 6 (six) AM. (Patient not taking: Reported on 10/29/2014) 30 tablet 0  . sucralfate (CARAFATE) 1 G  tablet Dissolve 1 tablet in 10 mL H20 and swallow up to QID PRN soreness with swallowing (Patient not taking: Reported on 10/29/2014) 60 tablet 4   No current facility-administered medications for this visit.    SURGICAL HISTORY:  Past Surgical History  Procedure Laterality Date  . Femur closed reduction Right     REVIEW OF SYSTEMS:  Constitutional: positive for fatigue and fevers Eyes: negative Ears, nose, mouth, throat, and face: negative Respiratory: positive for cough, dyspnea on exertion and yellow/green/grey secretions Cardiovascular: negative Gastrointestinal: negative Genitourinary:negative Integument/breast: negative Hematologic/lymphatic: negative Musculoskeletal:negative Neurological: negative Behavioral/Psych: negative Endocrine: negative Allergic/Immunologic: negative   PHYSICAL EXAMINATION: General appearance: alert, cooperative, fatigued and no distress Head: Normocephalic, without obvious abnormality, atraumatic Neck: no adenopathy, no JVD, supple, symmetrical, trachea midline and thyroid not enlarged, symmetric, no tenderness/mass/nodules Lymph nodes: Cervical, supraclavicular, and axillary nodes normal. Resp: diminished breath sounds RUL and dullness to percussion RUL Back: symmetric, no curvature. ROM normal. No CVA tenderness. Cardio: regular rate and rhythm, S1, S2 normal, no murmur, click, rub or gallop GI: soft, non-tender; bowel sounds normal; no masses,  no organomegaly Extremities: extremities normal, atraumatic, no cyanosis or edema Neurologic: Alert and oriented X 3, normal strength and tone. Normal symmetric reflexes. Normal coordination and gait  ECOG PERFORMANCE STATUS: 1 - Symptomatic but completely ambulatory  Blood pressure 112/66, pulse 101, temperature 100.8 F (38.2 C), temperature source Oral, resp. rate 18, height 5' (1.524 m), weight 118 lb 1.6 oz (53.57 kg), SpO2 98 %.  LABORATORY DATA: Lab Results  Component Value Date   WBC 7.0  10/29/2014   HGB 8.7* 10/29/2014   HCT 27.1* 10/29/2014   MCV 87.9 10/29/2014   PLT 753* 10/29/2014      Chemistry      Component Value Date/Time   NA 140 10/29/2014 0930   NA 134* 09/06/2014 0355   K 3.9 10/29/2014 0930   K 3.4* 09/06/2014 0355   CL 101 09/06/2014 0355   CO2 25 10/29/2014 0930   CO2 26 09/06/2014 0355   BUN 5.6* 10/29/2014 0930   BUN 8 09/06/2014 0355   CREATININE 0.6 10/29/2014 0930   CREATININE 0.55 09/06/2014 0355      Component Value Date/Time   CALCIUM 9.4 10/29/2014 0930   CALCIUM 8.3* 09/06/2014 0355   ALKPHOS 92 10/29/2014 0930   ALKPHOS 58 09/02/2014 0906   AST 21 10/29/2014 0930   AST 11* 09/02/2014 0906   ALT 19 10/29/2014 0930   ALT 11* 09/02/2014 0906   BILITOT 0.48 10/29/2014 0930   BILITOT 1.2 09/02/2014 0906       RADIOGRAPHIC STUDIES: No results found.  ASSESSMENT AND PLAN: This is a very pleasant 44 years old white female recently diagnosed with at least a stage IIIa (T3, N2, M0) non-small cell lung cancer, squamous cell carcinoma presented with large right upper lobe mass with questionable mediastinal invasion diagnosed in February 2016. The patient underwent a course of concurrent chemoradiation with weekly carboplatin and paclitaxel and tolerated her treatment fairly well. The recent CT scan of the chest showed decrease in the size of the cavitary right upper lobe mass as well as decrease in the size of the right suprahilar  and pretracheal lymph nodes consistent with response to the treatment. Patient is currently receiving treatment with carboplatin for AUC of 5 on day 1 and gemcitabine 1000 MG/M2 on days 1 and 8 every 3 weeks. Status post 1 cycle. The patient was discussed with and also seen by Dr. Julien Nordmann. We will postpone the start of cycle #2 x 1 week due to her presenting fever. A prescription for Levaquin 500 mg by mouth daily for the next 7 days a [facility] pharmacy [facility] E scribed. She may use Delsym or similar  over the  counter cough syrup.  The patient would come back for follow-up visit in 4 weeks for reevaluation before starting cycle #3. For the persistent low-grade fever which is concerning for tumor necrosis versus postobstructive pneumonia, she was placed on another empiric coursse of Levaquin 500 mg by mouth daily for 7 days. She was advised to call immediately if she has any concerning symptoms in the interval. The patient voices understanding of current disease status and treatment options and is in agreement with the current care plan.  All questions were answered. The patient knows to call the clinic with any problems, questions or concerns. We can certainly see the patient much sooner if necessary.  Carlton Adam, PA-C 10/29/2014   ADDENDUM: Hematology/Oncology Attending: I had a face to face encounter with the patient. I recommended her care plan. This is a very pleasant 44 years old white female with a stage IIIa non-small cell lung cancer status post concurrent chemoradiation and she is currently undergoing consolidation chemotherapy with carboplatin and gemcitabine status post 1 cycle. The patient is feeling fine today except for fever at home with temperature up to 102. She also has cough productive of yellowish green sputum. She was still febrile at the office with temperature of 100.1 I recommended for the patient to delay the start of cycle #2 by 1 week until she has improvement in her symptoms. We will start the patient on Levaquin 500 mg by mouth daily for 7 days. She will come back for follow-up visit in 4 weeks for reevaluation before starting cycle #3. The patient was advised to call immediately if she has any concerning symptoms in the interval.  Disclaimer: This note was dictated with voice recognition software. Similar sounding words can inadvertently be transcribed and may be missed upon review. Eilleen Kempf., MD 10/30/2014

## 2014-10-29 NOTE — Telephone Encounter (Signed)
avs printed for patient,no pof sent,patient aware to check mychart for any new appointments

## 2014-10-29 NOTE — Patient Instructions (Signed)
The start of cycle #2 of her chemotherapy is being postponed by 1 week so that she can be treated for your fevers. Takes the Levaquin as prescribed Continue labs and chemotherapy as scheduled Follow-up in 4 weeks prior to the start of cycle #3

## 2014-10-29 NOTE — Telephone Encounter (Signed)
lvm fo rpt regarding to July and Aug appt....mailed pt appt sched/avs adn letter

## 2014-11-06 ENCOUNTER — Ambulatory Visit: Payer: Medicaid Other | Admitting: Nutrition

## 2014-11-06 ENCOUNTER — Other Ambulatory Visit (HOSPITAL_BASED_OUTPATIENT_CLINIC_OR_DEPARTMENT_OTHER): Payer: Medicaid Other

## 2014-11-06 ENCOUNTER — Ambulatory Visit (HOSPITAL_BASED_OUTPATIENT_CLINIC_OR_DEPARTMENT_OTHER): Payer: Medicaid Other

## 2014-11-06 ENCOUNTER — Other Ambulatory Visit: Payer: Self-pay | Admitting: Internal Medicine

## 2014-11-06 VITALS — BP 93/61 | HR 86 | Temp 98.0°F | Resp 18

## 2014-11-06 DIAGNOSIS — Z5111 Encounter for antineoplastic chemotherapy: Secondary | ICD-10-CM

## 2014-11-06 DIAGNOSIS — C3411 Malignant neoplasm of upper lobe, right bronchus or lung: Secondary | ICD-10-CM

## 2014-11-06 DIAGNOSIS — C3491 Malignant neoplasm of unspecified part of right bronchus or lung: Secondary | ICD-10-CM

## 2014-11-06 LAB — CBC WITH DIFFERENTIAL/PLATELET
BASO%: 0.5 % (ref 0.0–2.0)
BASOS ABS: 0.1 10*3/uL (ref 0.0–0.1)
EOS ABS: 0.1 10*3/uL (ref 0.0–0.5)
EOS%: 0.6 % (ref 0.0–7.0)
HEMATOCRIT: 25 % — AB (ref 34.8–46.6)
HGB: 8.2 g/dL — ABNORMAL LOW (ref 11.6–15.9)
LYMPH%: 6.6 % — ABNORMAL LOW (ref 14.0–49.7)
MCH: 28.6 pg (ref 25.1–34.0)
MCHC: 32.9 g/dL (ref 31.5–36.0)
MCV: 87 fL (ref 79.5–101.0)
MONO#: 1 10*3/uL — ABNORMAL HIGH (ref 0.1–0.9)
MONO%: 9 % (ref 0.0–14.0)
NEUT%: 83.3 % — AB (ref 38.4–76.8)
NEUTROS ABS: 8.9 10*3/uL — AB (ref 1.5–6.5)
PLATELETS: 538 10*3/uL — AB (ref 145–400)
RBC: 2.88 10*6/uL — ABNORMAL LOW (ref 3.70–5.45)
RDW: 18 % — AB (ref 11.2–14.5)
WBC: 10.7 10*3/uL — ABNORMAL HIGH (ref 3.9–10.3)
lymph#: 0.7 10*3/uL — ABNORMAL LOW (ref 0.9–3.3)

## 2014-11-06 LAB — COMPREHENSIVE METABOLIC PANEL (CC13)
ALBUMIN: 2.1 g/dL — AB (ref 3.5–5.0)
ALK PHOS: 83 U/L (ref 40–150)
ALT: 14 U/L (ref 0–55)
AST: 16 U/L (ref 5–34)
Anion Gap: 9 mEq/L (ref 3–11)
BUN: 7.7 mg/dL (ref 7.0–26.0)
CALCIUM: 9 mg/dL (ref 8.4–10.4)
CO2: 26 mEq/L (ref 22–29)
Chloride: 105 mEq/L (ref 98–109)
Creatinine: 0.6 mg/dL (ref 0.6–1.1)
GLUCOSE: 118 mg/dL (ref 70–140)
POTASSIUM: 3.7 meq/L (ref 3.5–5.1)
SODIUM: 140 meq/L (ref 136–145)
TOTAL PROTEIN: 7.1 g/dL (ref 6.4–8.3)
Total Bilirubin: 0.51 mg/dL (ref 0.20–1.20)

## 2014-11-06 LAB — TECHNOLOGIST REVIEW

## 2014-11-06 MED ORDER — SODIUM CHLORIDE 0.9 % IV SOLN
Freq: Once | INTRAVENOUS | Status: AC
  Administered 2014-11-06: 10:00:00 via INTRAVENOUS
  Filled 2014-11-06: qty 8

## 2014-11-06 MED ORDER — SODIUM CHLORIDE 0.9 % IV SOLN
1000.0000 mg/m2 | Freq: Once | INTRAVENOUS | Status: AC
  Administered 2014-11-06: 1520 mg via INTRAVENOUS
  Filled 2014-11-06: qty 39.98

## 2014-11-06 MED ORDER — SODIUM CHLORIDE 0.9 % IV SOLN
515.5000 mg | Freq: Once | INTRAVENOUS | Status: AC
  Administered 2014-11-06: 520 mg via INTRAVENOUS
  Filled 2014-11-06: qty 52

## 2014-11-06 MED ORDER — SODIUM CHLORIDE 0.9 % IV SOLN
Freq: Once | INTRAVENOUS | Status: AC
  Administered 2014-11-06: 10:00:00 via INTRAVENOUS

## 2014-11-06 NOTE — Patient Instructions (Signed)
[facility] Discharge Instructions for Patients Receiving Chemotherapy  Today you received the following chemotherapy agents: Gemzar and Carboplatin   To help prevent nausea and vomiting after your treatment, we encourage you to take your nausea medication as directed.    If you develop nausea and vomiting that is not controlled by your nausea medication, call the clinic.   BELOW ARE SYMPTOMS THAT SHOULD BE REPORTED IMMEDIATELY:  *FEVER GREATER THAN 100.5 F  *CHILLS WITH OR WITHOUT FEVER  NAUSEA AND VOMITING THAT IS NOT CONTROLLED WITH YOUR NAUSEA MEDICATION  *UNUSUAL SHORTNESS OF BREATH  *UNUSUAL BRUISING OR BLEEDING  TENDERNESS IN MOUTH AND THROAT WITH OR WITHOUT PRESENCE OF ULCERS  *URINARY PROBLEMS  *BOWEL PROBLEMS  UNUSUAL RASH Items with * indicate a potential emergency and should be followed up as soon as possible.  Feel free to call the clinic you have any questions or concerns. The clinic phone number is [phone].  Please show the CHEMO ALERT CARD at check-in to the Emergency Department and triage nurse.   

## 2014-11-06 NOTE — Progress Notes (Signed)
Patient was identified to be at risk for malnutrition on the MST secondary to poor appetite and weight loss.  44 year old female diagnosed with lung cancer status post radiation therapy receiving chemotherapy.  She is a patient of Dr. Earlie Server.  Past medical history is not significant.  Medications include Colace, Protonix, Compazine, and Carafate.  Labs include albumin 2.5 on June 27.  Height: 5 feet 0 inches. 118.1 pounds. Usual body weight: 130 pounds. BMI: 23.06.  Patient reports her appetite is poor.  She has difficulty eating. She drinks one Carnation breakfast daily. She has nausea which is controlled with nausea medication. Patient endorses 12 pound weight loss.  Nutrition diagnosis: Unintended weight loss related to inadequate oral intake as evidenced by 9% weight loss from usual body weight.  Intervention: Patient was educated to consume small frequent meals and snacks utilizing high-calorie high-protein foods. Educated patient on strategies for increasing calories and protein and provided fact sheet Recommended patient increase Carnation breakfast essentials twice a day between meals.  I provided coupons. Questions were answered.  Teach back method used and contact information was given.  Monitoring, evaluation, goals: Patient will tolerate adequate calories and protein to minimize further weight loss.  Next visit: Monday, August 1 during infusion.  **Disclaimer: This note was dictated with voice recognition software. Similar sounding words can inadvertently be transcribed and this note may contain transcription errors which may not have been corrected upon publication of note.**

## 2014-11-12 ENCOUNTER — Other Ambulatory Visit: Payer: Medicaid Other

## 2014-11-13 ENCOUNTER — Ambulatory Visit (HOSPITAL_COMMUNITY)
Admission: RE | Admit: 2014-11-13 | Discharge: 2014-11-13 | Disposition: A | Discharge status: Home / Self Care | Payer: Medicaid Other | Source: Ambulatory Visit | Attending: Internal Medicine | Admitting: Internal Medicine

## 2014-11-13 ENCOUNTER — Other Ambulatory Visit (HOSPITAL_BASED_OUTPATIENT_CLINIC_OR_DEPARTMENT_OTHER): Payer: Medicaid Other

## 2014-11-13 ENCOUNTER — Ambulatory Visit (HOSPITAL_BASED_OUTPATIENT_CLINIC_OR_DEPARTMENT_OTHER): Payer: Medicaid Other

## 2014-11-13 ENCOUNTER — Other Ambulatory Visit: Payer: Medicaid Other

## 2014-11-13 ENCOUNTER — Other Ambulatory Visit: Payer: Self-pay | Admitting: *Deleted

## 2014-11-13 VITALS — BP 132/82 | HR 75 | Temp 98.7°F | Resp 20

## 2014-11-13 VITALS — BP 102/68 | HR 74 | Temp 97.8°F | Resp 16

## 2014-11-13 DIAGNOSIS — Z5111 Encounter for antineoplastic chemotherapy: Secondary | ICD-10-CM | POA: Diagnosis not present

## 2014-11-13 DIAGNOSIS — C3411 Malignant neoplasm of upper lobe, right bronchus or lung: Secondary | ICD-10-CM

## 2014-11-13 DIAGNOSIS — C3490 Malignant neoplasm of unspecified part of unspecified bronchus or lung: Secondary | ICD-10-CM

## 2014-11-13 DIAGNOSIS — C3491 Malignant neoplasm of unspecified part of right bronchus or lung: Secondary | ICD-10-CM

## 2014-11-13 LAB — CBC WITH DIFFERENTIAL/PLATELET
BASO%: 0.1 % (ref 0.0–2.0)
Basophils Absolute: 0 10*3/uL (ref 0.0–0.1)
EOS%: 0.5 % (ref 0.0–7.0)
Eosinophils Absolute: 0 10*3/uL (ref 0.0–0.5)
HCT: 23.6 % — ABNORMAL LOW (ref 34.8–46.6)
HEMOGLOBIN: 7.4 g/dL — AB (ref 11.6–15.9)
LYMPH#: 0.6 10*3/uL — AB (ref 0.9–3.3)
LYMPH%: 6.9 % — AB (ref 14.0–49.7)
MCH: 27.3 pg (ref 25.1–34.0)
MCHC: 31.4 g/dL — ABNORMAL LOW (ref 31.5–36.0)
MCV: 87.1 fL (ref 79.5–101.0)
MONO#: 0.6 10*3/uL (ref 0.1–0.9)
MONO%: 7.2 % (ref 0.0–14.0)
NEUT%: 85.3 % — ABNORMAL HIGH (ref 38.4–76.8)
NEUTROS ABS: 7.1 10*3/uL — AB (ref 1.5–6.5)
PLATELETS: 222 10*3/uL (ref 145–400)
RBC: 2.71 10*6/uL — ABNORMAL LOW (ref 3.70–5.45)
RDW: 16.8 % — ABNORMAL HIGH (ref 11.2–14.5)
WBC: 8.3 10*3/uL (ref 3.9–10.3)
nRBC: 0 % (ref 0–0)

## 2014-11-13 LAB — COMPREHENSIVE METABOLIC PANEL (CC13)
ALBUMIN: 2.4 g/dL — AB (ref 3.5–5.0)
ALT: 20 U/L (ref 0–55)
AST: 17 U/L (ref 5–34)
Alkaline Phosphatase: 97 U/L (ref 40–150)
Anion Gap: 7 mEq/L (ref 3–11)
BILIRUBIN TOTAL: 0.32 mg/dL (ref 0.20–1.20)
BUN: 14.4 mg/dL (ref 7.0–26.0)
CALCIUM: 9.4 mg/dL (ref 8.4–10.4)
CO2: 29 mEq/L (ref 22–29)
Chloride: 103 mEq/L (ref 98–109)
Creatinine: 0.6 mg/dL (ref 0.6–1.1)
EGFR: >90 mL/min/{1.73_m2} (ref >90)
Glucose: 84 mg/dl (ref 70–140)
Potassium: 3.4 mEq/L — ABNORMAL LOW (ref 3.5–5.1)
SODIUM: 139 meq/L (ref 136–145)
Total Protein: 7.4 g/dL (ref 6.4–8.3)

## 2014-11-13 LAB — PREPARE RBC (CROSSMATCH)

## 2014-11-13 MED ORDER — SODIUM CHLORIDE 0.9 % IJ SOLN: 10.0000 mL | INTRAMUSCULAR | Status: DC | PRN

## 2014-11-13 MED ORDER — HEPARIN SOD (PORK) LOCK FLUSH 100 UNIT/ML IV SOLN: 500.0000 [IU] | Freq: Every day | INTRAVENOUS | Status: DC | PRN

## 2014-11-13 MED ORDER — SODIUM CHLORIDE 0.9 % IV SOLN
250.0000 mL | Freq: Once | INTRAVENOUS | Status: AC
  Administered 2014-11-13: 250 mL via INTRAVENOUS

## 2014-11-13 MED ORDER — DIPHENHYDRAMINE HCL 25 MG PO CAPS
ORAL_CAPSULE | ORAL | Status: AC
  Administered 2014-11-13: 25 mg
  Filled 2014-11-13: qty 1

## 2014-11-13 MED ORDER — SODIUM CHLORIDE 0.9 % IJ SOLN: 3.0000 mL | INTRAMUSCULAR | Status: DC | PRN

## 2014-11-13 MED ORDER — SODIUM CHLORIDE 0.9 % IV SOLN
1000.0000 mg/m2 | Freq: Once | INTRAVENOUS | Status: AC
  Administered 2014-11-13: 1520 mg via INTRAVENOUS
  Filled 2014-11-13: qty 39.98

## 2014-11-13 MED ORDER — PROCHLORPERAZINE MALEATE 10 MG PO TABS
ORAL_TABLET | ORAL | Status: AC
  Filled 2014-11-13: qty 1

## 2014-11-13 MED ORDER — SODIUM CHLORIDE 0.9 % IV SOLN
Freq: Once | INTRAVENOUS | Status: AC
  Administered 2014-11-13: 11:00:00 via INTRAVENOUS

## 2014-11-13 MED ORDER — HEPARIN SOD (PORK) LOCK FLUSH 100 UNIT/ML IV SOLN: 250.0000 [IU] | INTRAVENOUS | Status: DC | PRN

## 2014-11-13 MED ORDER — DIPHENHYDRAMINE HCL 25 MG PO CAPS: 25.0000 mg | ORAL_CAPSULE | Freq: Once | ORAL | Status: AC

## 2014-11-13 MED ORDER — PROCHLORPERAZINE MALEATE 10 MG PO TABS
10.0000 mg | ORAL_TABLET | Freq: Once | ORAL | Status: AC
  Administered 2014-11-13: 10 mg via ORAL

## 2014-11-13 MED ORDER — ACETAMINOPHEN 325 MG PO TABS
650.0000 mg | ORAL_TABLET | Freq: Once | ORAL | Status: AC
  Administered 2014-11-13: 650 mg via ORAL

## 2014-11-13 NOTE — Patient Instructions (Signed)
[facility] Discharge Instructions for Patients Receiving Chemotherapy  Today you received the following chemotherapy agents Gemzar  To help prevent nausea and vomiting after your treatment, we encourage you to take your nausea medication as directed.    If you develop nausea and vomiting that is not controlled by your nausea medication, call the clinic.   BELOW ARE SYMPTOMS THAT SHOULD BE REPORTED IMMEDIATELY:  *FEVER GREATER THAN 100.5 F  *CHILLS WITH OR WITHOUT FEVER  NAUSEA AND VOMITING THAT IS NOT CONTROLLED WITH YOUR NAUSEA MEDICATION  *UNUSUAL SHORTNESS OF BREATH  *UNUSUAL BRUISING OR BLEEDING  TENDERNESS IN MOUTH AND THROAT WITH OR WITHOUT PRESENCE OF ULCERS  *URINARY PROBLEMS  *BOWEL PROBLEMS  UNUSUAL RASH Items with * indicate a potential emergency and should be followed up as soon as possible.  Feel free to call the clinic you have any questions or concerns. The clinic phone number is [phone].  Please show the CHEMO ALERT CARD at check-in to the Emergency Department and triage nurse.   

## 2014-11-13 NOTE — Procedures (Signed)
Associated Diagnosis: Non small cell carcinoma lung unspecified laterally 162.9 MD: Julien Nordmann  Procedure Note: Received 2 unit PRB's Condition during procedure: Tolerated well Condition after procedure: alert, oriented, and ambulatory

## 2014-11-13 NOTE — Progress Notes (Signed)
Per MD ok for Gemzar despite Hgb, transfuse 2 units RBC.  Har complete, call placed to sickle cell, appt  At 1200 for 2units.  Pt informed on location of sickle cell unit and appt time.

## 2014-11-13 NOTE — Progress Notes (Signed)
Pt will receive 2 units PRBC at Sickle Cell after Gemzar infusion per Dr. Julien Nordmann for hgb 7.4.  Type and cross obtained in infusion room and sent off to lab.  Pt given directions and verbalized understanding.

## 2014-11-14 LAB — TYPE AND SCREEN
ABO/RH(D): B POS
Antibody Screen: NEGATIVE
UNIT DIVISION: 0
Unit division: 0

## 2014-11-19 ENCOUNTER — Other Ambulatory Visit (HOSPITAL_BASED_OUTPATIENT_CLINIC_OR_DEPARTMENT_OTHER): Payer: Medicaid Other

## 2014-11-19 ENCOUNTER — Telehealth: Payer: Self-pay | Admitting: Medical Oncology

## 2014-11-19 DIAGNOSIS — C3491 Malignant neoplasm of unspecified part of right bronchus or lung: Secondary | ICD-10-CM

## 2014-11-19 DIAGNOSIS — C3411 Malignant neoplasm of upper lobe, right bronchus or lung: Secondary | ICD-10-CM | POA: Diagnosis not present

## 2014-11-19 LAB — COMPREHENSIVE METABOLIC PANEL (CC13)
ALT: 58 U/L — ABNORMAL HIGH (ref 0–55)
AST: 35 U/L — ABNORMAL HIGH (ref 5–34)
Albumin: 2.3 g/dL — ABNORMAL LOW (ref 3.5–5.0)
Alkaline Phosphatase: 134 U/L (ref 40–150)
Anion Gap: 8 mEq/L (ref 3–11)
BUN: 11.4 mg/dL (ref 7.0–26.0)
CO2: 28 mEq/L (ref 22–29)
Calcium: 9.3 mg/dL (ref 8.4–10.4)
Chloride: 104 mEq/L (ref 98–109)
Creatinine: 0.6 mg/dL (ref 0.6–1.1)
EGFR: >90 mL/min/{1.73_m2} (ref >90)
Glucose: 141 mg/dl — ABNORMAL HIGH (ref 70–140)
Potassium: 4 mEq/L (ref 3.5–5.1)
Sodium: 139 mEq/L (ref 136–145)
Total Bilirubin: 0.84 mg/dL (ref 0.20–1.20)
Total Protein: 7 g/dL (ref 6.4–8.3)

## 2014-11-19 LAB — CBC WITH DIFFERENTIAL/PLATELET
BASO%: 0.3 % (ref 0.0–2.0)
BASOS ABS: 0 10*3/uL (ref 0.0–0.1)
EOS%: 0.3 % (ref 0.0–7.0)
Eosinophils Absolute: 0 10*3/uL (ref 0.0–0.5)
HCT: 28.2 % — ABNORMAL LOW (ref 34.8–46.6)
HEMOGLOBIN: 9 g/dL — AB (ref 11.6–15.9)
LYMPH#: 0.3 10*3/uL — AB (ref 0.9–3.3)
LYMPH%: 9.7 % — ABNORMAL LOW (ref 14.0–49.7)
MCH: 27.9 pg (ref 25.1–34.0)
MCHC: 31.9 g/dL (ref 31.5–36.0)
MCV: 87.3 fL (ref 79.5–101.0)
MONO#: 0.3 10*3/uL (ref 0.1–0.9)
MONO%: 11.1 % (ref 0.0–14.0)
NEUT#: 2.3 10*3/uL (ref 1.5–6.5)
NEUT%: 78.6 % — ABNORMAL HIGH (ref 38.4–76.8)
NRBC: 0 % (ref 0–0)
PLATELETS: 17 10*3/uL — AB (ref 145–400)
RBC: 3.23 10*6/uL — ABNORMAL LOW (ref 3.70–5.45)
RDW: 15.8 % — ABNORMAL HIGH (ref 11.2–14.5)
WBC: 2.9 10*3/uL — AB (ref 3.9–10.3)

## 2014-11-19 NOTE — Progress Notes (Signed)
Quick Note:  Call patient with the result and advise bleeding precautions. ______

## 2014-11-19 NOTE — Telephone Encounter (Signed)
reviewed bleeding precautions with mother.

## 2014-11-19 NOTE — Telephone Encounter (Signed)
-----   Message from Curt Bears, MD sent at 11/19/2014  1:07 PM EDT ----- Call patient with the result and advise bleeding precautions.

## 2014-11-20 ENCOUNTER — Other Ambulatory Visit: Payer: Medicaid Other

## 2014-11-26 ENCOUNTER — Telehealth: Payer: Self-pay | Admitting: Internal Medicine

## 2014-11-26 ENCOUNTER — Other Ambulatory Visit (HOSPITAL_BASED_OUTPATIENT_CLINIC_OR_DEPARTMENT_OTHER): Payer: Medicaid Other

## 2014-11-26 ENCOUNTER — Ambulatory Visit (HOSPITAL_BASED_OUTPATIENT_CLINIC_OR_DEPARTMENT_OTHER): Payer: Medicaid Other | Admitting: Internal Medicine

## 2014-11-26 ENCOUNTER — Encounter: Payer: Self-pay | Admitting: Internal Medicine

## 2014-11-26 ENCOUNTER — Ambulatory Visit (HOSPITAL_BASED_OUTPATIENT_CLINIC_OR_DEPARTMENT_OTHER): Payer: Medicaid Other

## 2014-11-26 VITALS — BP 118/74 | HR 90 | Temp 98.4°F | Resp 17 | Ht 60.0 in | Wt 120.4 lb

## 2014-11-26 DIAGNOSIS — C3491 Malignant neoplasm of unspecified part of right bronchus or lung: Secondary | ICD-10-CM

## 2014-11-26 DIAGNOSIS — C3411 Malignant neoplasm of upper lobe, right bronchus or lung: Secondary | ICD-10-CM

## 2014-11-26 DIAGNOSIS — Z5111 Encounter for antineoplastic chemotherapy: Secondary | ICD-10-CM

## 2014-11-26 DIAGNOSIS — D61818 Other pancytopenia: Secondary | ICD-10-CM

## 2014-11-26 LAB — COMPREHENSIVE METABOLIC PANEL (CC13)
ALT: 22 U/L (ref 0–55)
AST: 18 U/L (ref 5–34)
Albumin: 2.3 g/dL — ABNORMAL LOW (ref 3.5–5.0)
Alkaline Phosphatase: 133 U/L (ref 40–150)
Anion Gap: 10 mEq/L (ref 3–11)
BUN: 13.4 mg/dL (ref 7.0–26.0)
CHLORIDE: 105 meq/L (ref 98–109)
CO2: 26 mEq/L (ref 22–29)
Calcium: 9.3 mg/dL (ref 8.4–10.4)
Creatinine: 0.6 mg/dL (ref 0.6–1.1)
EGFR: >90 mL/min/{1.73_m2} (ref >90)
GLUCOSE: 75 mg/dL (ref 70–140)
POTASSIUM: 3.9 meq/L (ref 3.5–5.1)
Sodium: 142 mEq/L (ref 136–145)
Total Bilirubin: 0.35 mg/dL (ref 0.20–1.20)
Total Protein: 7.4 g/dL (ref 6.4–8.3)

## 2014-11-26 LAB — CBC WITH DIFFERENTIAL/PLATELET
BASO%: 0.4 % (ref 0.0–2.0)
Basophils Absolute: 0 10*3/uL (ref 0.0–0.1)
EOS%: 1.4 % (ref 0.0–7.0)
Eosinophils Absolute: 0.1 10*3/uL (ref 0.0–0.5)
HCT: 30.1 % — ABNORMAL LOW (ref 34.8–46.6)
HGB: 9.9 g/dL — ABNORMAL LOW (ref 11.6–15.9)
LYMPH#: 1 10*3/uL (ref 0.9–3.3)
LYMPH%: 13.2 % — ABNORMAL LOW (ref 14.0–49.7)
MCH: 28 pg (ref 25.1–34.0)
MCHC: 32.8 g/dL (ref 31.5–36.0)
MCV: 85.2 fL (ref 79.5–101.0)
MONO#: 1.7 10*3/uL — ABNORMAL HIGH (ref 0.1–0.9)
MONO%: 21.4 % — ABNORMAL HIGH (ref 0.0–14.0)
NEUT%: 63.6 % (ref 38.4–76.8)
NEUTROS ABS: 5 10*3/uL (ref 1.5–6.5)
Platelets: 478 10*3/uL — ABNORMAL HIGH (ref 145–400)
RBC: 3.53 10*6/uL — AB (ref 3.70–5.45)
RDW: 17.3 % — ABNORMAL HIGH (ref 11.2–14.5)
WBC: 7.8 10*3/uL (ref 3.9–10.3)

## 2014-11-26 LAB — TECHNOLOGIST REVIEW

## 2014-11-26 MED ORDER — SODIUM CHLORIDE 0.9 % IV SOLN
800.0000 mg/m2 | Freq: Once | INTRAVENOUS | Status: AC
  Administered 2014-11-26: 1216 mg via INTRAVENOUS
  Filled 2014-11-26: qty 31.98

## 2014-11-26 MED ORDER — SODIUM CHLORIDE 0.9 % IV SOLN
412.4000 mg | Freq: Once | INTRAVENOUS | Status: AC
  Administered 2014-11-26: 410 mg via INTRAVENOUS
  Filled 2014-11-26: qty 41

## 2014-11-26 MED ORDER — SODIUM CHLORIDE 0.9 % IV SOLN
Freq: Once | INTRAVENOUS | Status: AC
  Administered 2014-11-26: 11:00:00 via INTRAVENOUS
  Filled 2014-11-26: qty 8

## 2014-11-26 MED ORDER — SODIUM CHLORIDE 0.9 % IV SOLN
Freq: Once | INTRAVENOUS | Status: AC
  Administered 2014-11-26: 11:00:00 via INTRAVENOUS

## 2014-11-26 NOTE — Telephone Encounter (Signed)
Gave and printed appt sched and avs for pt for Aug....gave barium

## 2014-11-26 NOTE — Progress Notes (Signed)
[facility] [phone]   Fax:(336) [place], MD [place] [address]  DIAGNOSIS: Locally advanced questionable for a stage IIIA (T3, N2, M0) non-small cell lung cancer, squamous cell carcinoma diagnosed in February 2016 presented with large right upper lobe lung mass with mediastinal invasion.  PRIOR THERAPY: Concurrent chemoradiation with weekly carboplatin for AUC of 2 and paclitaxel 45 MG/M2. First dose 07/16/2014. Status post 6 cycles of chemotherapy last dose was given 08/20/2014 with partial response.  CURRENT THERAPY: Consolidation chemotherapy with carboplatin for AUC of 5 on day 1 and gemcitabine 1000 MG/M2 on days 1 and 8 every 3 weeks. First dose 10/08/2014. Status post 2 cycles.  INTERVAL HISTORY: Barbara Frost 44 y.o. female returns to the clinic today for hospital follow-up visit accompanied by her mother. She is tolerating her current consolidation chemotherapy with carboplatin and gemcitabine fairly well is status post 2 cycles. She has intermittent right-sided chest pain with mild shortness of breath and cough with occasional hemoptysis. She denied having any significant weight loss or night sweats. She has no significant nausea or vomiting, no fever or chills. She is here today to start cycle #3 of her chemotherapy.  MEDICAL HISTORY: Past Medical History  Diagnosis Date  . Squamous cell carcinoma of right lung 06/29/14  . S/P radiation therapy 07/10/2014 through 08/20/2014     Right lung/mediastinum 6000 cGy in 30 sessions     ALLERGIES:  is allergic to keflex.  MEDICATIONS:  Current Outpatient Prescriptions  Medication Sig Dispense Refill  . albuterol (PROVENTIL HFA;VENTOLIN HFA) 108 (90 BASE) MCG/ACT inhaler Inhale 1-2 puffs into the lungs every 6 (six) hours as needed for wheezing or shortness of breath. Use  2 puffs 3 times daily x 4 days, then every 6 hours as needed. 1 Inhaler 0  . aspirin 325 MG EC tablet Take 325 mg by mouth daily.     Marland Kitchen docusate sodium (COLACE) 100 MG capsule Take 1 capsule (100 mg total) by mouth 2 (two) times daily. (Patient not taking: Reported on 10/29/2014) 10 capsule 0  . ibuprofen (ADVIL,MOTRIN) 600 MG tablet Take 1 tablet (600 mg total) by mouth 4 (four) times daily. Take for 4 days then use as needed. 30 tablet 0  . levofloxacin (LEVAQUIN) 500 MG tablet Take 1 tablet (500 mg total) by mouth daily. (Patient not taking: Reported on 10/29/2014) 7 tablet 0  . levofloxacin (LEVAQUIN) 500 MG tablet Take 1 tablet (500 mg total) by mouth daily. 7 tablet 0  . oxyCODONE-acetaminophen (PERCOCET/ROXICET) 5-325 MG per tablet Take 1 tablet by mouth every 6 (six) hours as needed for severe pain. 30 tablet 0  . pantoprazole (PROTONIX) 40 MG tablet Take 1 tablet (40 mg total) by mouth daily at 6 (six) AM. (Patient not taking: Reported on 10/29/2014) 30 tablet 0  . prochlorperazine (COMPAZINE) 10 MG tablet Take 1 tablet (10 mg total) by mouth every 6 (six) hours as needed for nausea or vomiting. 30 tablet 0  . sucralfate (CARAFATE) 1 G tablet Dissolve 1 tablet in 10 mL H20 and swallow up to QID PRN soreness with swallowing (Patient not taking: Reported on 10/29/2014) 60 tablet 4   No current facility-administered medications for this visit.    SURGICAL HISTORY:  Past Surgical History  Procedure Laterality Date  . Femur closed reduction Right     REVIEW OF SYSTEMS:  A comprehensive review of systems was negative except for: Respiratory:  positive for cough, dyspnea on exertion, hemoptysis and sputum   PHYSICAL EXAMINATION: General appearance: alert, cooperative, fatigued and no distress Head: Normocephalic, without obvious abnormality, atraumatic Neck: no adenopathy, no JVD, supple, symmetrical, trachea midline and thyroid not enlarged, symmetric, no tenderness/mass/nodules Lymph nodes:  Cervical, supraclavicular, and axillary nodes normal. Resp: diminished breath sounds RUL and dullness to percussion RUL Back: symmetric, no curvature. ROM normal. No CVA tenderness. Cardio: regular rate and rhythm, S1, S2 normal, no murmur, click, rub or gallop GI: soft, non-tender; bowel sounds normal; no masses,  no organomegaly Extremities: extremities normal, atraumatic, no cyanosis or edema Neurologic: Alert and oriented X 3, normal strength and tone. Normal symmetric reflexes. Normal coordination and gait  ECOG PERFORMANCE STATUS: 1 - Symptomatic but completely ambulatory  Blood pressure 118/74, pulse 90, temperature 98.4 F (36.9 C), temperature source Oral, resp. rate 17, height 5' (1.524 m), weight 120 lb 6.4 oz (54.613 kg), SpO2 98 %.  LABORATORY DATA: Lab Results  Component Value Date   WBC 7.8 11/26/2014   HGB 9.9* 11/26/2014   HCT 30.1* 11/26/2014   MCV 85.2 11/26/2014   PLT 478* 11/26/2014      Chemistry      Component Value Date/Time   NA 139 11/19/2014 0852   NA 134* 09/06/2014 0355   K 4.0 11/19/2014 0852   K 3.4* 09/06/2014 0355   CL 101 09/06/2014 0355   CO2 28 11/19/2014 0852   CO2 26 09/06/2014 0355   BUN 11.4 11/19/2014 0852   BUN 8 09/06/2014 0355   CREATININE 0.6 11/19/2014 0852   CREATININE 0.55 09/06/2014 0355      Component Value Date/Time   CALCIUM 9.3 11/19/2014 0852   CALCIUM 8.3* 09/06/2014 0355   ALKPHOS 134 11/19/2014 0852   ALKPHOS 58 09/02/2014 0906   AST 35* 11/19/2014 0852   AST 11* 09/02/2014 0906   ALT 58* 11/19/2014 0852   ALT 11* 09/02/2014 0906   BILITOT 0.84 11/19/2014 0852   BILITOT 1.2 09/02/2014 0906       RADIOGRAPHIC STUDIES: No results found.  ASSESSMENT AND PLAN: This is a very pleasant 44 years old white female recently diagnosed with at least a stage IIIa (T3, N2, M0) non-small cell lung cancer, squamous cell carcinoma presented with large right upper lobe mass with questionable mediastinal invasion diagnosed  in February 2016. The patient underwent a course of concurrent chemoradiation with weekly carboplatin and paclitaxel and tolerated her treatment fairly well. The restaging CT scan of the chest showed decrease in the size of the cavitary right upper lobe mass as well as decrease in the size of the right suprahilar and pretracheal lymph nodes consistent with response to the treatment. She is currently undergoing consolidation chemotherapy with carboplatin and gemcitabine status post 2 cycles. She is tolerating the treatment well except for the pancytopenia. I would reduce the dose of her treatment to carboplatin for AUC of 4 on day 1 and gemcitabine 800 MG/M2 on days 1 and 8 for this cycle. She would come back for follow-up visit in one month for reevaluation after repeating CT scan of the chest for restaging of her disease. She was advised to call immediately if she has any concerning symptoms in the interval. The patient voices understanding of current disease status and treatment options and is in agreement with the current care plan.  All questions were answered. The patient knows to call the clinic with any problems, questions or concerns. We can certainly see the patient much sooner  if necessary.  Disclaimer: This note was dictated with voice recognition software. Similar sounding words can inadvertently be transcribed and may not be corrected upon review.

## 2014-11-26 NOTE — Patient Instructions (Signed)
[facility] Discharge Instructions for Patients Receiving Chemotherapy  Today you received the following chemotherapy agents Gemzar/Carboplatin.  To help prevent nausea and vomiting after your treatment, we encourage you to take your nausea medication as directed.   If you develop nausea and vomiting that is not controlled by your nausea medication, call the clinic.   BELOW ARE SYMPTOMS THAT SHOULD BE REPORTED IMMEDIATELY:  *FEVER GREATER THAN 100.5 F  *CHILLS WITH OR WITHOUT FEVER  NAUSEA AND VOMITING THAT IS NOT CONTROLLED WITH YOUR NAUSEA MEDICATION  *UNUSUAL SHORTNESS OF BREATH  *UNUSUAL BRUISING OR BLEEDING  TENDERNESS IN MOUTH AND THROAT WITH OR WITHOUT PRESENCE OF ULCERS  *URINARY PROBLEMS  *BOWEL PROBLEMS  UNUSUAL RASH Items with * indicate a potential emergency and should be followed up as soon as possible.  Feel free to call the clinic you have any questions or concerns. The clinic phone number is [phone].  Please show the [place] at check-in to the Emergency Department and triage nurse.

## 2014-12-03 ENCOUNTER — Ambulatory Visit (HOSPITAL_BASED_OUTPATIENT_CLINIC_OR_DEPARTMENT_OTHER): Payer: Medicaid Other

## 2014-12-03 ENCOUNTER — Other Ambulatory Visit (HOSPITAL_BASED_OUTPATIENT_CLINIC_OR_DEPARTMENT_OTHER): Payer: Medicaid Other

## 2014-12-03 ENCOUNTER — Ambulatory Visit: Payer: Medicaid Other | Admitting: Nutrition

## 2014-12-03 VITALS — BP 134/88 | HR 96 | Temp 98.3°F | Resp 18

## 2014-12-03 DIAGNOSIS — C3491 Malignant neoplasm of unspecified part of right bronchus or lung: Secondary | ICD-10-CM

## 2014-12-03 DIAGNOSIS — C3411 Malignant neoplasm of upper lobe, right bronchus or lung: Secondary | ICD-10-CM

## 2014-12-03 DIAGNOSIS — Z5111 Encounter for antineoplastic chemotherapy: Secondary | ICD-10-CM | POA: Diagnosis not present

## 2014-12-03 LAB — COMPREHENSIVE METABOLIC PANEL (CC13)
ALK PHOS: 129 U/L (ref 40–150)
ALT: 28 U/L (ref 0–55)
ANION GAP: 8 meq/L (ref 3–11)
AST: 19 U/L (ref 5–34)
Albumin: 2.4 g/dL — ABNORMAL LOW (ref 3.5–5.0)
BILIRUBIN TOTAL: 0.31 mg/dL (ref 0.20–1.20)
BUN: 11.2 mg/dL (ref 7.0–26.0)
CALCIUM: 9.2 mg/dL (ref 8.4–10.4)
CO2: 26 meq/L (ref 22–29)
CREATININE: 0.6 mg/dL (ref 0.6–1.1)
Chloride: 105 mEq/L (ref 98–109)
GLUCOSE: 108 mg/dL (ref 70–140)
Potassium: 3.5 mEq/L (ref 3.5–5.1)
Sodium: 138 mEq/L (ref 136–145)
Total Protein: 7.3 g/dL (ref 6.4–8.3)

## 2014-12-03 LAB — CBC WITH DIFFERENTIAL/PLATELET
BASO%: 0.3 % (ref 0.0–2.0)
BASOS ABS: 0 10*3/uL (ref 0.0–0.1)
EOS ABS: 0 10*3/uL (ref 0.0–0.5)
EOS%: 0.5 % (ref 0.0–7.0)
HCT: 27.9 % — ABNORMAL LOW (ref 34.8–46.6)
HGB: 9.1 g/dL — ABNORMAL LOW (ref 11.6–15.9)
LYMPH#: 0.5 10*3/uL — AB (ref 0.9–3.3)
LYMPH%: 8.3 % — AB (ref 14.0–49.7)
MCH: 27.7 pg (ref 25.1–34.0)
MCHC: 32.7 g/dL (ref 31.5–36.0)
MCV: 84.6 fL (ref 79.5–101.0)
MONO#: 1 10*3/uL — ABNORMAL HIGH (ref 0.1–0.9)
MONO%: 15.9 % — ABNORMAL HIGH (ref 0.0–14.0)
NEUT#: 4.7 10*3/uL (ref 1.5–6.5)
NEUT%: 75 % (ref 38.4–76.8)
Platelets: 410 10*3/uL — ABNORMAL HIGH (ref 145–400)
RBC: 3.3 10*6/uL — ABNORMAL LOW (ref 3.70–5.45)
RDW: 17.6 % — ABNORMAL HIGH (ref 11.2–14.5)
WBC: 6.3 10*3/uL (ref 3.9–10.3)

## 2014-12-03 LAB — TECHNOLOGIST REVIEW

## 2014-12-03 MED ORDER — PROCHLORPERAZINE MALEATE 10 MG PO TABS
10.0000 mg | ORAL_TABLET | Freq: Once | ORAL | Status: AC
  Administered 2014-12-03: 10 mg via ORAL

## 2014-12-03 MED ORDER — PROCHLORPERAZINE MALEATE 10 MG PO TABS
ORAL_TABLET | ORAL | Status: AC
  Filled 2014-12-03: qty 1

## 2014-12-03 MED ORDER — SODIUM CHLORIDE 0.9 % IV SOLN
800.0000 mg/m2 | Freq: Once | INTRAVENOUS | Status: AC
  Administered 2014-12-03: 1216 mg via INTRAVENOUS
  Filled 2014-12-03: qty 31.98

## 2014-12-03 MED ORDER — SODIUM CHLORIDE 0.9 % IV SOLN
Freq: Once | INTRAVENOUS | Status: AC
  Administered 2014-12-03: 10:00:00 via INTRAVENOUS

## 2014-12-03 NOTE — Patient Instructions (Signed)
[facility] Discharge Instructions for Patients Receiving Chemotherapy  Today you received the following chemotherapy agents Gemzar  To help prevent nausea and vomiting after your treatment, we encourage you to take your nausea medication as directed.    If you develop nausea and vomiting that is not controlled by your nausea medication, call the clinic.   BELOW ARE SYMPTOMS THAT SHOULD BE REPORTED IMMEDIATELY:  *FEVER GREATER THAN 100.5 F  *CHILLS WITH OR WITHOUT FEVER  NAUSEA AND VOMITING THAT IS NOT CONTROLLED WITH YOUR NAUSEA MEDICATION  *UNUSUAL SHORTNESS OF BREATH  *UNUSUAL BRUISING OR BLEEDING  TENDERNESS IN MOUTH AND THROAT WITH OR WITHOUT PRESENCE OF ULCERS  *URINARY PROBLEMS  *BOWEL PROBLEMS  UNUSUAL RASH Items with * indicate a potential emergency and should be followed up as soon as possible.  Feel free to call the clinic you have any questions or concerns. The clinic phone number is [phone].  Please show the CHEMO ALERT CARD at check-in to the Emergency Department and triage nurse.   

## 2014-12-03 NOTE — Progress Notes (Signed)
Nutrition follow-up completed with patient during chemotherapy. Patient reports her appetite and oral intake have improved. She continues to drink one Carn[employer] breakfast essentials daily. Weight improved and documented as 120.4 pounds July 25. Patient has no specific nutrition related questions today.  Nutrition diagnosis: Unintended weight loss has improved.  Intervention:  Provided support and encouragement for patient to continue increased oral intake to minimize weight loss. Provided additional coupons for Carnation breakfast essentials and recommended patient continue once daily. Educated patient on strategies in case appetite decreases. Teach back method was used.  Monitoring, evaluation, goals: Patient will tolerate increased calories and protein to minimize weight loss.  Next visit: To be scheduled as needed.  Patient has my contact information for questions.  **Disclaimer: This note was dictated with voice recognition software. Similar sounding words can inadvertently be transcribed and this note may contain transcription errors which may not have been corrected upon publication of note.**

## 2014-12-03 NOTE — Progress Notes (Signed)
Pt reports intermittent Right arm pain, pt takes pain medication at home which resolves pain. Pt educated to call clinic if pain worsens. Pt verbalizes understanding.

## 2014-12-05 ENCOUNTER — Telehealth: Payer: Self-pay | Admitting: Medical Oncology

## 2014-12-05 NOTE — Telephone Encounter (Signed)
Barbara Frost will schedule Ct prior to 8/22 and call pt. I tried to call pt to not drink contrast that some one gave her since she is only have CT chest

## 2014-12-10 ENCOUNTER — Other Ambulatory Visit (HOSPITAL_BASED_OUTPATIENT_CLINIC_OR_DEPARTMENT_OTHER): Payer: Medicaid Other

## 2014-12-10 DIAGNOSIS — C3491 Malignant neoplasm of unspecified part of right bronchus or lung: Secondary | ICD-10-CM

## 2014-12-10 DIAGNOSIS — C3411 Malignant neoplasm of upper lobe, right bronchus or lung: Secondary | ICD-10-CM | POA: Diagnosis present

## 2014-12-10 LAB — COMPREHENSIVE METABOLIC PANEL (CC13)
ALK PHOS: 131 U/L (ref 40–150)
ALT: 48 U/L (ref 0–55)
AST: 32 U/L (ref 5–34)
Albumin: 2.4 g/dL — ABNORMAL LOW (ref 3.5–5.0)
Anion Gap: 10 mEq/L (ref 3–11)
BILIRUBIN TOTAL: 0.41 mg/dL (ref 0.20–1.20)
BUN: 9.9 mg/dL (ref 7.0–26.0)
CALCIUM: 9.4 mg/dL (ref 8.4–10.4)
CHLORIDE: 107 meq/L (ref 98–109)
CO2: 26 mEq/L (ref 22–29)
Creatinine: 0.7 mg/dL (ref 0.6–1.1)
EGFR: >90 mL/min/{1.73_m2} (ref >90)
Glucose: 95 mg/dl (ref 70–140)
Potassium: 4.8 mEq/L (ref 3.5–5.1)
Sodium: 142 mEq/L (ref 136–145)
TOTAL PROTEIN: 7.5 g/dL (ref 6.4–8.3)

## 2014-12-10 LAB — CBC WITH DIFFERENTIAL/PLATELET
BASO%: 0.2 % (ref 0.0–2.0)
BASOS ABS: 0 10*3/uL (ref 0.0–0.1)
EOS ABS: 0 10*3/uL (ref 0.0–0.5)
EOS%: 0.2 % (ref 0.0–7.0)
HEMATOCRIT: 25.4 % — AB (ref 34.8–46.6)
HGB: 8.2 g/dL — ABNORMAL LOW (ref 11.6–15.9)
LYMPH%: 10.2 % — ABNORMAL LOW (ref 14.0–49.7)
MCH: 27.3 pg (ref 25.1–34.0)
MCHC: 32.3 g/dL (ref 31.5–36.0)
MCV: 84.7 fL (ref 79.5–101.0)
MONO#: 1.1 10*3/uL — AB (ref 0.1–0.9)
MONO%: 18.7 % — AB (ref 0.0–14.0)
NEUT%: 70.7 % (ref 38.4–76.8)
NEUTROS ABS: 4.1 10*3/uL (ref 1.5–6.5)
Platelets: 78 10*3/uL — ABNORMAL LOW (ref 145–400)
RBC: 3 10*6/uL — AB (ref 3.70–5.45)
RDW: 17.5 % — ABNORMAL HIGH (ref 11.2–14.5)
WBC: 5.8 10*3/uL (ref 3.9–10.3)
lymph#: 0.6 10*3/uL — ABNORMAL LOW (ref 0.9–3.3)

## 2014-12-17 ENCOUNTER — Other Ambulatory Visit: Payer: Medicaid Other

## 2014-12-21 ENCOUNTER — Other Ambulatory Visit (HOSPITAL_BASED_OUTPATIENT_CLINIC_OR_DEPARTMENT_OTHER): Payer: Medicaid Other

## 2014-12-21 ENCOUNTER — Ambulatory Visit (HOSPITAL_COMMUNITY)
Admission: RE | Admit: 2014-12-21 | Discharge: 2014-12-21 | Disposition: A | Discharge status: Home / Self Care | Payer: Medicaid Other | Source: Ambulatory Visit | Attending: Internal Medicine | Admitting: Internal Medicine

## 2014-12-21 ENCOUNTER — Encounter (HOSPITAL_COMMUNITY): Payer: Self-pay

## 2014-12-21 DIAGNOSIS — R0602 Shortness of breath: Secondary | ICD-10-CM | POA: Diagnosis not present

## 2014-12-21 DIAGNOSIS — I313 Pericardial effusion (noninflammatory): Secondary | ICD-10-CM | POA: Diagnosis not present

## 2014-12-21 DIAGNOSIS — Z923 Personal history of irradiation: Secondary | ICD-10-CM | POA: Diagnosis not present

## 2014-12-21 DIAGNOSIS — Z9221 Personal history of antineoplastic chemotherapy: Secondary | ICD-10-CM | POA: Insufficient documentation

## 2014-12-21 DIAGNOSIS — C3491 Malignant neoplasm of unspecified part of right bronchus or lung: Secondary | ICD-10-CM | POA: Insufficient documentation

## 2014-12-21 DIAGNOSIS — R079 Chest pain, unspecified: Secondary | ICD-10-CM | POA: Diagnosis not present

## 2014-12-21 DIAGNOSIS — R918 Other nonspecific abnormal finding of lung field: Secondary | ICD-10-CM | POA: Diagnosis not present

## 2014-12-21 DIAGNOSIS — C3411 Malignant neoplasm of upper lobe, right bronchus or lung: Secondary | ICD-10-CM | POA: Diagnosis present

## 2014-12-21 LAB — CBC WITH DIFFERENTIAL/PLATELET
BASO%: 0.3 % (ref 0.0–2.0)
Basophils Absolute: 0.1 10*3/uL (ref 0.0–0.1)
EOS ABS: 0.2 10*3/uL (ref 0.0–0.5)
EOS%: 1 % (ref 0.0–7.0)
HEMATOCRIT: 27.6 % — AB (ref 34.8–46.6)
HEMOGLOBIN: 8.6 g/dL — AB (ref 11.6–15.9)
LYMPH#: 0.6 10*3/uL — AB (ref 0.9–3.3)
LYMPH%: 3.3 % — ABNORMAL LOW (ref 14.0–49.7)
MCH: 26.4 pg (ref 25.1–34.0)
MCHC: 31.3 g/dL — ABNORMAL LOW (ref 31.5–36.0)
MCV: 84.5 fL (ref 79.5–101.0)
MONO#: 2.7 10*3/uL — ABNORMAL HIGH (ref 0.1–0.9)
MONO%: 14.6 % — ABNORMAL HIGH (ref 0.0–14.0)
NEUT#: 14.9 10*3/uL — ABNORMAL HIGH (ref 1.5–6.5)
NEUT%: 80.8 % — ABNORMAL HIGH (ref 38.4–76.8)
Platelets: 568 10*3/uL — ABNORMAL HIGH (ref 145–400)
RBC: 3.26 10*6/uL — ABNORMAL LOW (ref 3.70–5.45)
RDW: 19.4 % — AB (ref 11.2–14.5)
WBC: 18.5 10*3/uL — ABNORMAL HIGH (ref 3.9–10.3)

## 2014-12-21 LAB — COMPREHENSIVE METABOLIC PANEL (CC13)
ALBUMIN: 2.2 g/dL — AB (ref 3.5–5.0)
ALK PHOS: 99 U/L (ref 40–150)
ALT: 16 U/L (ref 0–55)
AST: 14 U/L (ref 5–34)
Anion Gap: 11 mEq/L (ref 3–11)
BILIRUBIN TOTAL: 0.54 mg/dL (ref 0.20–1.20)
BUN: 18.2 mg/dL (ref 7.0–26.0)
CALCIUM: 9.1 mg/dL (ref 8.4–10.4)
CO2: 22 mEq/L (ref 22–29)
CREATININE: 0.7 mg/dL (ref 0.6–1.1)
Chloride: 106 mEq/L (ref 98–109)
EGFR: >90 mL/min/{1.73_m2} (ref >90)
Glucose: 100 mg/dl (ref 70–140)
Potassium: 3.6 mEq/L (ref 3.5–5.1)
Sodium: 139 mEq/L (ref 136–145)
TOTAL PROTEIN: 7.3 g/dL (ref 6.4–8.3)

## 2014-12-21 MED ORDER — IOHEXOL 300 MG/ML  SOLN
100.0000 mL | Freq: Once | INTRAMUSCULAR | Status: AC | PRN
  Administered 2014-12-21: 75 mL via INTRAVENOUS

## 2014-12-24 ENCOUNTER — Encounter: Payer: Self-pay | Admitting: Internal Medicine

## 2014-12-24 ENCOUNTER — Encounter: Payer: Self-pay | Admitting: *Deleted

## 2014-12-24 ENCOUNTER — Telehealth: Payer: Self-pay | Admitting: Internal Medicine

## 2014-12-24 ENCOUNTER — Other Ambulatory Visit (HOSPITAL_BASED_OUTPATIENT_CLINIC_OR_DEPARTMENT_OTHER): Payer: Medicaid Other

## 2014-12-24 ENCOUNTER — Ambulatory Visit (HOSPITAL_BASED_OUTPATIENT_CLINIC_OR_DEPARTMENT_OTHER): Payer: Medicaid Other | Admitting: Internal Medicine

## 2014-12-24 VITALS — BP 105/67 | HR 94 | Temp 98.6°F | Resp 18 | Ht 60.0 in | Wt 120.7 lb

## 2014-12-24 DIAGNOSIS — D649 Anemia, unspecified: Secondary | ICD-10-CM | POA: Diagnosis not present

## 2014-12-24 DIAGNOSIS — C3491 Malignant neoplasm of unspecified part of right bronchus or lung: Secondary | ICD-10-CM

## 2014-12-24 DIAGNOSIS — C3411 Malignant neoplasm of upper lobe, right bronchus or lung: Secondary | ICD-10-CM | POA: Diagnosis not present

## 2014-12-24 DIAGNOSIS — R0602 Shortness of breath: Secondary | ICD-10-CM | POA: Diagnosis not present

## 2014-12-24 DIAGNOSIS — R05 Cough: Secondary | ICD-10-CM

## 2014-12-24 DIAGNOSIS — R079 Chest pain, unspecified: Secondary | ICD-10-CM

## 2014-12-24 DIAGNOSIS — D509 Iron deficiency anemia, unspecified: Secondary | ICD-10-CM

## 2014-12-24 LAB — CBC WITH DIFFERENTIAL/PLATELET
BASO%: 0.2 % (ref 0.0–2.0)
Basophils Absolute: 0 10*3/uL (ref 0.0–0.1)
EOS%: 0.6 % (ref 0.0–7.0)
Eosinophils Absolute: 0.1 10*3/uL (ref 0.0–0.5)
HCT: 26.6 % — ABNORMAL LOW (ref 34.8–46.6)
HGB: 8.6 g/dL — ABNORMAL LOW (ref 11.6–15.9)
LYMPH%: 4 % — AB (ref 14.0–49.7)
MCH: 27.5 pg (ref 25.1–34.0)
MCHC: 32.4 g/dL (ref 31.5–36.0)
MCV: 84.8 fL (ref 79.5–101.0)
MONO#: 2.2 10*3/uL — ABNORMAL HIGH (ref 0.1–0.9)
MONO%: 12 % (ref 0.0–14.0)
NEUT%: 83.2 % — ABNORMAL HIGH (ref 38.4–76.8)
NEUTROS ABS: 15.1 10*3/uL — AB (ref 1.5–6.5)
Platelets: 580 10*3/uL — ABNORMAL HIGH (ref 145–400)
RBC: 3.14 10*6/uL — AB (ref 3.70–5.45)
RDW: 19.9 % — ABNORMAL HIGH (ref 11.2–14.5)
WBC: 18.1 10*3/uL — AB (ref 3.9–10.3)
lymph#: 0.7 10*3/uL — ABNORMAL LOW (ref 0.9–3.3)

## 2014-12-24 LAB — COMPREHENSIVE METABOLIC PANEL (CC13)
ALT: 13 U/L (ref 0–55)
AST: 12 U/L (ref 5–34)
Albumin: 2.1 g/dL — ABNORMAL LOW (ref 3.5–5.0)
Alkaline Phosphatase: 97 U/L (ref 40–150)
Anion Gap: 12 mEq/L — ABNORMAL HIGH (ref 3–11)
BILIRUBIN TOTAL: 0.31 mg/dL (ref 0.20–1.20)
BUN: 12.8 mg/dL (ref 7.0–26.0)
CO2: 25 meq/L (ref 22–29)
CREATININE: 0.7 mg/dL (ref 0.6–1.1)
Calcium: 9.2 mg/dL (ref 8.4–10.4)
Chloride: 103 mEq/L (ref 98–109)
EGFR: >90 mL/min/{1.73_m2} (ref >90)
GLUCOSE: 140 mg/dL (ref 70–140)
Potassium: 3 mEq/L — CL (ref 3.5–5.1)
Sodium: 140 mEq/L (ref 136–145)
TOTAL PROTEIN: 7.3 g/dL (ref 6.4–8.3)

## 2014-12-24 LAB — TECHNOLOGIST REVIEW

## 2014-12-24 MED ORDER — MORPHINE SULFATE ER 30 MG PO TBCR: 30.0000 mg | EXTENDED_RELEASE_TABLET | Freq: Two times a day (BID) | ORAL | Status: DC

## 2014-12-24 MED ORDER — POTASSIUM CHLORIDE ER 20 MEQ PO TBCR: 20.0000 meq | EXTENDED_RELEASE_TABLET | Freq: Every day | ORAL | Status: DC

## 2014-12-24 NOTE — Addendum Note (Signed)
Addended by: Curt Bears on: 12/24/2014 09:44 AM   Modules accepted: Orders

## 2014-12-24 NOTE — Progress Notes (Signed)
Oncology Nurse Navigator Documentation  Oncology Nurse Navigator Flowsheets 12/24/2014  Navigator Encounter Type Other/spoke with patient today at clinic.  She is doing ok, still having some pain in right shoulder.  Dr. Julien Nordmann prescribed pain med.  I will notify pre cert team about prescription.  She will be on observation for now.    Patient Visit Type Follow-up  Treatment Phase Other  Barriers/Navigation Needs Education  Interventions Education Method  Time Spent with Patient 15

## 2014-12-24 NOTE — Progress Notes (Signed)
[facility] [phone]   Fax:([phone]) [place], [place] [address]  DIAGNOSIS: Locally advanced questionable for a stage IIIA (T3, N2, M0) non-small cell lung cancer, squamous cell carcinoma diagnosed in February 2016 presented with large right upper lobe lung mass with mediastinal invasion.  PRIOR THERAPY:  1) Concurrent chemoradiation with weekly carboplatin for AUC of 2 and paclitaxel 45 MG/M2. First dose 07/16/2014. Status post 6 cycles of chemotherapy last dose was given 08/20/2014 with partial response. 2) Consolidation chemotherapy with carboplatin for AUC of 5 on day 1 and gemcitabine 1000 MG/M2 on days 1 and 8 every 3 weeks. First dose 10/08/2014. Status post 3 cycles.  CURRENT THERAPY: Observation.  INTERVAL HISTORY: Barbara Frost 44 y.o. female returns to the clinic today for hospital follow-up visit accompanied by her mother. The patient completed 3 cycles of consolidation chemotherapy with carboplatin and gemcitabine and tolerated her treatment well except for fatigue. She has intermittent right-sided chest pain with mild shortness of breath and cough with occasional hemoptysis. Her chest pain is getting worse and she is currently on Percocet and ibuprofen. She denied having any significant weight loss or night sweats. She has no significant nausea or vomiting, no fever or chills. She had repeat CT scan of the chest performed recently and she is here for evaluation and discussion of her scan results.  MEDICAL HISTORY: Past Medical History  Diagnosis Date  . S/P radiation therapy 07/10/2014 through 08/20/2014     Right lung/mediastinum 6000 cGy in 30 sessions   . Squamous cell carcinoma of right lung 06/29/14    ALLERGIES:  is allergic to keflex.  MEDICATIONS:  Current Outpatient Prescriptions  Medication Sig  Dispense Refill  . albuterol (PROVENTIL HFA;VENTOLIN HFA) 108 (90 BASE) MCG/ACT inhaler Inhale 1-2 puffs into the lungs every 6 (six) hours as needed for wheezing or shortness of breath. Use 2 puffs 3 times daily x 4 days, then every 6 hours as needed. 1 Inhaler 0  . aspirin 325 MG EC tablet Take 325 mg by mouth daily.     Marland Kitchen docusate sodium (COLACE) 100 MG capsule Take 1 capsule (100 mg total) by mouth 2 (two) times daily. (Patient not taking: Reported on 10/29/2014) 10 capsule 0  . ibuprofen (ADVIL,MOTRIN) 600 MG tablet Take 1 tablet (600 mg total) by mouth 4 (four) times daily. Take for 4 days then use as needed. (Patient not taking: Reported on 11/26/2014) 30 tablet 0  . oxyCODONE-acetaminophen (PERCOCET/ROXICET) 5-325 MG per tablet Take 1 tablet by mouth every 6 (six) hours as needed for severe pain. 30 tablet 0  . prochlorperazine (COMPAZINE) 10 MG tablet Take 1 tablet (10 mg total) by mouth every 6 (six) hours as needed for nausea or vomiting. (Patient not taking: Reported on 11/26/2014) 30 tablet 0   No current facility-administered medications for this visit.    SURGICAL HISTORY:  Past Surgical History  Procedure Laterality Date  . Femur closed reduction Right     REVIEW OF SYSTEMS:  Constitutional: positive for fatigue Eyes: negative Ears, nose, mouth, throat, and face: negative Respiratory: positive for dyspnea on exertion and pleurisy/chest pain Cardiovascular: negative Gastrointestinal: negative Genitourinary:negative Integument/breast: negative Hematologic/lymphatic: negative Musculoskeletal:negative Neurological: negative Behavioral/Psych: negative Endocrine: negative Allergic/Immunologic: negative   PHYSICAL EXAMINATION: General appearance: alert, cooperative, fatigued and no distress Head: Normocephalic, without obvious abnormality, atraumatic Neck: no adenopathy, no JVD, supple, symmetrical, trachea midline and thyroid not enlarged, symmetric,  no  tenderness/mass/nodules Lymph nodes: Cervical, supraclavicular, and axillary nodes normal. Resp: diminished breath sounds RUL and dullness to percussion RUL Back: symmetric, no curvature. ROM normal. No CVA tenderness. Cardio: regular rate and rhythm, S1, S2 normal, no murmur, click, rub or gallop GI: soft, non-tender; bowel sounds normal; no masses,  no organomegaly Extremities: extremities normal, atraumatic, no cyanosis or edema Neurologic: Alert and oriented X 3, normal strength and tone. Normal symmetric reflexes. Normal coordination and gait  ECOG PERFORMANCE STATUS: 1 - Symptomatic but completely ambulatory  Last menstrual period 12/21/2014.  LABORATORY DATA: Lab Results  Component Value Date   WBC 18.1* 12/24/2014   HGB 8.6* 12/24/2014   HCT 26.6* 12/24/2014   MCV 84.8 12/24/2014   PLT 580* 12/24/2014      Chemistry      Component Value Date/Time   NA 140 12/24/2014 0836   NA 134* 09/06/2014 0355   K 3.0* 12/24/2014 0836   K 3.4* 09/06/2014 0355   CL 101 09/06/2014 0355   CO2 25 12/24/2014 0836   CO2 26 09/06/2014 0355   BUN 12.8 12/24/2014 0836   BUN 8 09/06/2014 0355   CREATININE 0.7 12/24/2014 0836   CREATININE 0.55 09/06/2014 0355      Component Value Date/Time   CALCIUM 9.2 12/24/2014 0836   CALCIUM 8.3* 09/06/2014 0355   ALKPHOS 97 12/24/2014 0836   ALKPHOS 58 09/02/2014 0906   AST 12 12/24/2014 0836   AST 11* 09/02/2014 0906   ALT 13 12/24/2014 0836   ALT 11* 09/02/2014 0906   BILITOT 0.31 12/24/2014 0836   BILITOT 1.2 09/02/2014 0906       RADIOGRAPHIC STUDIES: Ct Chest W Contrast  12/21/2014   CLINICAL DATA:  Lung cancer diagnosed 5 months ago. Chemotherapy and radiation therapy completed. Upper chest pain and shortness of breath. Subsequent encounter)  EXAM: CT CHEST WITH CONTRAST  TECHNIQUE: Multidetector CT imaging of the chest was performed during intravenous contrast administration.  CONTRAST:  34m OMNIPAQUE IOHEXOL 300 MG/ML  SOLN   COMPARISON:  CTs 09/24/2014 and 09/02/2014.  PET-CT 07/11/2014.  FINDINGS: Mediastinum/Nodes: 11 mm right hilar node on image 22 appears about the same. No enlarged mediastinal or axillary lymph nodes are demonstrated. There are probable small reactive right paratracheal nodes. There is increased edema throughout the mediastinal fat with mild proximal esophageal wall thickening, likely related to radiation therapy. The heart size is normal. There is a new small pericardial effusion. There are no significant vascular findings.  Lungs/Pleura: There has been further cavitation of the large right apical mass which occupies most of the upper lobe and is difficult to accurately measure. This cavitary mass has persistent irregularly thick walls. The central air has increased. There is a small amount of adjacent loculated pleural fluid. The extrapleural fat planes are indistinct, especially posteriorly. There is no chest wall destruction. There is new focal consolidation laterally in the superior segment of the right lower lobe. There are multiple nodular airspace opacities elsewhere throughout the lungs. There is no central bronchovascular nodularity to suggest lymphangitic spread of tumor.  Upper abdomen:  Unremarkable.  There is no adrenal mass.  Musculoskeletal/Chest wall: There is no chest wall mass or suspicious osseous finding. No definite chest wall extension of tumor identified. Mild Schmorl's node formation in the upper lumbar spine appears unchanged.  IMPRESSION: 1. New consolidation laterally in the superior segment of the right lower lobe, likely related to radiation therapy. 2. There are multiple other ill-defined nodular airspace opacities throughout the  lungs which appear inflammatory, likely infectious. Drug reaction would be a consideration. This appearance is atypical for metastatic disease. 3. The large cavitary right upper lobe mass has not grossly changed in size and has persistent thickened walls,  although does show increased central cavitation. The extrapleural and mediastinal fat planes surrounding this lesion are ill-defined, probably related to radiation therapy. No definite chest wall invasion or distant metastases identified. 4. New small pericardial effusion. 5. Follow-up PET-CT may be helpful to assess for residual metabolic activity within the right apical lesion.   Electronically Signed   By: Richardean Sale M.D.   On: 12/21/2014 10:40    ASSESSMENT AND PLAN: This is a very pleasant 44 years old white female recently diagnosed with at least a stage IIIa (T3, N2, M0) non-small cell lung cancer, squamous cell carcinoma presented with large right upper lobe mass with questionable mediastinal invasion diagnosed in February 2016. The patient underwent a course of concurrent chemoradiation with weekly carboplatin and paclitaxel and tolerated her treatment fairly well.  This was followed by a course of concurrent chemoradiation with carboplatin and gemcitabine and the patient tolerated her treatment well. The recent CT scan of the chest showed stable disease with new consolidation in the superior segment of the right lower lobe likely related to previous radiation treatment. There was also multiple ill-defined nodular airspace opacities questionable for inflammatory process. The patient is currently on treatment with Augmentin by her primary care physician. I discussed the scan results and showed the images to the patient and her mother. I recommended for her to continue on observation for now with repeat CT scan of the chest in 3 months. For pain management, I started the patient on MS Contin 30 mg by mouth every 12 hours. She will continue on Percocet and ibuprofen for breakthrough pain. For anemia, the patient will continue on oral iron tablets. She was advised to call immediately if she has any concerning symptoms in the interval. The patient voices understanding of current disease status  and treatment options and is in agreement with the current care plan.  All questions were answered. The patient knows to call the clinic with any problems, questions or concerns. We can certainly see the patient much sooner if necessary.  Disclaimer: This note was dictated with voice recognition software. Similar sounding words can inadvertently be transcribed and may not be corrected upon review.

## 2014-12-24 NOTE — Telephone Encounter (Signed)
Pt confirmed labs/ov per 08/22 POF, gave pt avs and calendar... KJ

## 2014-12-24 NOTE — Addendum Note (Signed)
Addended by: Lucile Crater on: 12/24/2014 09:48 AM   Modules accepted: Medications

## 2014-12-25 ENCOUNTER — Encounter: Payer: Self-pay | Admitting: Internal Medicine

## 2014-12-25 ENCOUNTER — Telehealth: Payer: Self-pay | Admitting: *Deleted

## 2014-12-25 NOTE — Progress Notes (Signed)
I faxed nctracks req for morphine ms contin

## 2014-12-25 NOTE — Telephone Encounter (Signed)
Fax received from [employer], [place] needed in order for MS CONTIN to be filled. Fax Request placed in Managed care in box.

## 2014-12-29 ENCOUNTER — Inpatient Hospital Stay (HOSPITAL_COMMUNITY)
Admission: EM | Admit: 2014-12-29 | Discharge: 2015-01-04 | DRG: 178 | Disposition: A | Discharge status: Home / Self Care | Payer: Medicaid Other | Attending: Internal Medicine | Admitting: Internal Medicine

## 2014-12-29 ENCOUNTER — Emergency Department (HOSPITAL_COMMUNITY): Payer: Medicaid Other

## 2014-12-29 ENCOUNTER — Encounter (HOSPITAL_COMMUNITY): Payer: Self-pay

## 2014-12-29 DIAGNOSIS — Y95 Nosocomial condition: Secondary | ICD-10-CM | POA: Diagnosis present

## 2014-12-29 DIAGNOSIS — D649 Anemia, unspecified: Secondary | ICD-10-CM | POA: Diagnosis not present

## 2014-12-29 DIAGNOSIS — D72829 Elevated white blood cell count, unspecified: Secondary | ICD-10-CM | POA: Diagnosis not present

## 2014-12-29 DIAGNOSIS — J851 Abscess of lung with pneumonia: Principal | ICD-10-CM | POA: Diagnosis present

## 2014-12-29 DIAGNOSIS — C3491 Malignant neoplasm of unspecified part of right bronchus or lung: Secondary | ICD-10-CM

## 2014-12-29 DIAGNOSIS — C3490 Malignant neoplasm of unspecified part of unspecified bronchus or lung: Secondary | ICD-10-CM | POA: Diagnosis not present

## 2014-12-29 DIAGNOSIS — A419 Sepsis, unspecified organism: Secondary | ICD-10-CM

## 2014-12-29 DIAGNOSIS — G893 Neoplasm related pain (acute) (chronic): Secondary | ICD-10-CM | POA: Diagnosis present

## 2014-12-29 DIAGNOSIS — D509 Iron deficiency anemia, unspecified: Secondary | ICD-10-CM | POA: Diagnosis not present

## 2014-12-29 DIAGNOSIS — Z7982 Long term (current) use of aspirin: Secondary | ICD-10-CM

## 2014-12-29 DIAGNOSIS — R0781 Pleurodynia: Secondary | ICD-10-CM | POA: Diagnosis not present

## 2014-12-29 DIAGNOSIS — J852 Abscess of lung without pneumonia: Secondary | ICD-10-CM | POA: Insufficient documentation

## 2014-12-29 DIAGNOSIS — J189 Pneumonia, unspecified organism: Secondary | ICD-10-CM

## 2014-12-29 DIAGNOSIS — Z923 Personal history of irradiation: Secondary | ICD-10-CM

## 2014-12-29 DIAGNOSIS — R042 Hemoptysis: Secondary | ICD-10-CM | POA: Diagnosis present

## 2014-12-29 DIAGNOSIS — Z9221 Personal history of antineoplastic chemotherapy: Secondary | ICD-10-CM

## 2014-12-29 DIAGNOSIS — Z8249 Family history of ischemic heart disease and other diseases of the circulatory system: Secondary | ICD-10-CM

## 2014-12-29 DIAGNOSIS — D709 Neutropenia, unspecified: Secondary | ICD-10-CM

## 2014-12-29 DIAGNOSIS — Z833 Family history of diabetes mellitus: Secondary | ICD-10-CM

## 2014-12-29 DIAGNOSIS — Z79899 Other long term (current) drug therapy: Secondary | ICD-10-CM

## 2014-12-29 LAB — COMPREHENSIVE METABOLIC PANEL
ALBUMIN: 2.4 g/dL — AB (ref 3.5–5.0)
ALK PHOS: 82 U/L (ref 38–126)
ALT: 10 U/L — AB (ref 14–54)
AST: 15 U/L (ref 15–41)
Anion gap: 9 (ref 5–15)
BILIRUBIN TOTAL: 0.5 mg/dL (ref 0.3–1.2)
BUN: 9 mg/dL (ref 6–20)
CALCIUM: 8.5 mg/dL — AB (ref 8.9–10.3)
CO2: 25 mmol/L (ref 22–32)
CREATININE: 0.53 mg/dL (ref 0.44–1.00)
Chloride: 102 mmol/L (ref 101–111)
GFR calc Af Amer: >60 mL/min (ref >60)
GFR calc non Af Amer: >60 mL/min (ref >60)
GLUCOSE: 101 mg/dL — AB (ref 65–99)
POTASSIUM: 4 mmol/L (ref 3.5–5.1)
Sodium: 136 mmol/L (ref 135–145)
TOTAL PROTEIN: 7.4 g/dL (ref 6.5–8.1)

## 2014-12-29 LAB — I-STAT CG4 LACTIC ACID, ED: Lactic Acid, Venous: 1.47 mmol/L (ref 0.5–2.0)

## 2014-12-29 LAB — CBC WITH DIFFERENTIAL/PLATELET
Basophils Absolute: 0 10*3/uL (ref 0.0–0.1)
Basophils Relative: 0 % (ref 0–1)
EOS PCT: 1 % (ref 0–5)
Eosinophils Absolute: 0.2 10*3/uL (ref 0.0–0.7)
HCT: 24.7 % — ABNORMAL LOW (ref 36.0–46.0)
Hemoglobin: 7.3 g/dL — ABNORMAL LOW (ref 12.0–15.0)
LYMPHS ABS: 2 10*3/uL (ref 0.7–4.0)
Lymphocytes Relative: 9 % — ABNORMAL LOW (ref 12–46)
MCH: 26.9 pg (ref 26.0–34.0)
MCHC: 29.6 g/dL — ABNORMAL LOW (ref 30.0–36.0)
MCV: 91.1 fL (ref 78.0–100.0)
MONO ABS: 1.3 10*3/uL — AB (ref 0.1–1.0)
Monocytes Relative: 6 % (ref 3–12)
Neutro Abs: 18.6 10*3/uL — ABNORMAL HIGH (ref 1.7–7.7)
Neutrophils Relative %: 84 % — ABNORMAL HIGH (ref 43–77)
PLATELETS: 445 10*3/uL — AB (ref 150–400)
RBC: 2.71 MIL/uL — AB (ref 3.87–5.11)
RDW: 19.9 % — AB (ref 11.5–15.5)
WBC: 22.1 10*3/uL — AB (ref 4.0–10.5)

## 2014-12-29 LAB — I-STAT TROPONIN, ED: TROPONIN I, POC: 0 ng/mL (ref 0.00–0.08)

## 2014-12-29 LAB — URINALYSIS, ROUTINE W REFLEX MICROSCOPIC
BILIRUBIN URINE: NEGATIVE
GLUCOSE, UA: NEGATIVE mg/dL
Hgb urine dipstick: NEGATIVE
KETONES UR: NEGATIVE mg/dL
Leukocytes, UA: NEGATIVE
NITRITE: NEGATIVE
PH: 7 (ref 5.0–8.0)
Protein, ur: NEGATIVE mg/dL
Specific Gravity, Urine: 1.02 (ref 1.005–1.030)
Urobilinogen, UA: 2 mg/dL — ABNORMAL HIGH (ref 0.0–1.0)

## 2014-12-29 LAB — EXPECTORATED SPUTUM ASSESSMENT W REFEX TO RESP CULTURE

## 2014-12-29 LAB — EXPECTORATED SPUTUM ASSESSMENT W GRAM STAIN, RFLX TO RESP C

## 2014-12-29 MED ORDER — VANCOMYCIN HCL IN DEXTROSE 1-5 GM/200ML-% IV SOLN
1000.0000 mg | Freq: Once | INTRAVENOUS | Status: AC
  Administered 2014-12-29: 1000 mg via INTRAVENOUS
  Filled 2014-12-29: qty 200

## 2014-12-29 MED ORDER — DEXTROSE 5 % IV SOLN
500.0000 mg | INTRAVENOUS | Status: DC
  Administered 2014-12-29 – 2015-01-02 (×5): 500 mg via INTRAVENOUS
  Filled 2014-12-29 (×8): qty 500

## 2014-12-29 MED ORDER — MORPHINE SULFATE (PF) 2 MG/ML IV SOLN
2.0000 mg | INTRAVENOUS | Status: DC | PRN
  Administered 2014-12-29 – 2015-01-04 (×30): 2 mg via INTRAVENOUS
  Filled 2014-12-29 (×30): qty 1

## 2014-12-29 MED ORDER — PIPERACILLIN-TAZOBACTAM 3.375 G IVPB
3.3750 g | Freq: Once | INTRAVENOUS | Status: AC
  Administered 2014-12-29: 3.375 g via INTRAVENOUS
  Filled 2014-12-29: qty 50

## 2014-12-29 MED ORDER — ONDANSETRON HCL 4 MG/2ML IJ SOLN: 4.0000 mg | Freq: Three times a day (TID) | INTRAMUSCULAR | Status: DC | PRN

## 2014-12-29 MED ORDER — SODIUM CHLORIDE 0.9 % IV BOLUS (SEPSIS)
1000.0000 mL | Freq: Once | INTRAVENOUS | Status: AC
  Administered 2014-12-29: 1000 mL via INTRAVENOUS

## 2014-12-29 MED ORDER — BENZONATATE 100 MG PO CAPS
100.0000 mg | ORAL_CAPSULE | Freq: Three times a day (TID) | ORAL | Status: DC
  Administered 2014-12-29 – 2015-01-04 (×18): 100 mg via ORAL
  Filled 2014-12-29 (×24): qty 1

## 2014-12-29 MED ORDER — OXYCODONE-ACETAMINOPHEN 10-325 MG PO TABS: 1.0000 | ORAL_TABLET | ORAL | Status: DC | PRN

## 2014-12-29 MED ORDER — HYDROMORPHONE HCL 1 MG/ML IJ SOLN
1.0000 mg | INTRAMUSCULAR | Status: DC | PRN
  Administered 2014-12-29 – 2014-12-30 (×3): 1 mg via INTRAVENOUS
  Filled 2014-12-29 (×3): qty 1

## 2014-12-29 MED ORDER — HYDROMORPHONE HCL 1 MG/ML IJ SOLN
1.0000 mg | Freq: Once | INTRAMUSCULAR | Status: AC
  Administered 2014-12-29: 1 mg via INTRAVENOUS
  Filled 2014-12-29: qty 1

## 2014-12-29 MED ORDER — MORPHINE SULFATE (PF) 2 MG/ML IV SOLN: 2.0000 mg | INTRAVENOUS | Status: DC | PRN

## 2014-12-29 MED ORDER — HYDROCOD POLST-CPM POLST ER 10-8 MG/5ML PO SUER
5.0000 mL | Freq: Two times a day (BID) | ORAL | Status: DC
  Administered 2014-12-29 – 2015-01-02 (×8): 5 mL via ORAL
  Filled 2014-12-29 (×8): qty 5

## 2014-12-29 MED ORDER — OXYCODONE HCL ER 20 MG PO T12A
20.0000 mg | EXTENDED_RELEASE_TABLET | Freq: Two times a day (BID) | ORAL | Status: DC
  Administered 2014-12-29: 20 mg via ORAL
  Filled 2014-12-29: qty 1

## 2014-12-29 MED ORDER — PIPERACILLIN-TAZOBACTAM 3.375 G IVPB 30 MIN
3.3750 g | Freq: Three times a day (TID) | INTRAVENOUS | Status: DC
  Filled 2014-12-29: qty 50

## 2014-12-29 MED ORDER — PIPERACILLIN-TAZOBACTAM 3.375 G IVPB
3.3750 g | Freq: Three times a day (TID) | INTRAVENOUS | Status: DC
  Administered 2014-12-29 – 2015-01-03 (×14): 3.375 g via INTRAVENOUS
  Filled 2014-12-29 (×16): qty 50

## 2014-12-29 MED ORDER — OXYCODONE HCL 5 MG PO TABS
10.0000 mg | ORAL_TABLET | ORAL | Status: DC | PRN
  Administered 2014-12-29 – 2015-01-04 (×28): 10 mg via ORAL
  Filled 2014-12-29 (×29): qty 2

## 2014-12-29 MED ORDER — PIPERACILLIN-TAZOBACTAM 4.5 G IVPB: 4.5000 g | Freq: Once | INTRAVENOUS | Status: DC

## 2014-12-29 MED ORDER — SODIUM CHLORIDE 0.9 % IV SOLN
INTRAVENOUS | Status: DC
  Administered 2014-12-29: 19:00:00 via INTRAVENOUS

## 2014-12-29 MED ORDER — HYDROMORPHONE HCL 1 MG/ML IJ SOLN
1.0000 mg | INTRAMUSCULAR | Status: AC
  Administered 2014-12-29: 1 mg via INTRAVENOUS
  Filled 2014-12-29: qty 1

## 2014-12-29 MED ORDER — VANCOMYCIN HCL IN DEXTROSE 750-5 MG/150ML-% IV SOLN
750.0000 mg | Freq: Two times a day (BID) | INTRAVENOUS | Status: DC
  Administered 2014-12-30 – 2015-01-01 (×5): 750 mg via INTRAVENOUS
  Filled 2014-12-29 (×6): qty 150

## 2014-12-29 NOTE — Progress Notes (Signed)
ANTIBIOTIC CONSULT NOTE - INITIAL  Pharmacy Consult for Vancomycin; renally adjust abx Indication: pneumonia  Allergies  Allergen Reactions  . Keflex [Cephalexin] Rash    Patient Measurements:    Vital Signs: Temp: 99.1 F (37.3 C) (08/27 1715) Temp Source: Oral (08/27 1715) BP: 151/98 mmHg (08/27 1715) Pulse Rate: 110 (08/27 1715) Intake/Output from previous day:   Intake/Output from this shift:    Labs:  Recent Labs  12/29/14 1535  WBC 22.1*  HGB 7.3*  PLT 445*  CREATININE 0.53   Estimated Creatinine Clearance: 69 mL/min (by C-G formula based on Cr of 0.53). No results for input(s): VANCOTROUGH, VANCOPEAK, VANCORANDOM, GENTTROUGH, GENTPEAK, GENTRANDOM, TOBRATROUGH, TOBRAPEAK, TOBRARND, AMIKACINPEAK, AMIKACINTROU, AMIKACIN in the last 72 hours.   Microbiology: Recent Results (from the past 720 hour(s))  TECHNOLOGIST REVIEW     Status: None   Collection Time: 12/03/14  8:57 AM  Result Value Ref Range Status   Technologist Review Occassional Metas and Myelocytes present  Final  TECHNOLOGIST REVIEW     Status: None   Collection Time: 12/24/14  8:36 AM  Result Value Ref Range Status   Technologist Review Rare metamyelocyte present  Final    Medical History: Past Medical History  Diagnosis Date  . S/P radiation therapy 07/10/2014 through 08/20/2014     Right lung/mediastinum 6000 cGy in 30 sessions   . Squamous cell carcinoma of right lung 06/29/14    Medications:  Anti-infectives    Start     Dose/Rate Route Frequency Ordered Stop   12/29/14 1715  piperacillin-tazobactam (ZOSYN) IVPB 3.375 g     3.375 g 100 mL/hr over 30 Minutes Intravenous 3 times per day 12/29/14 1713     12/29/14 1715  azithromycin (ZITHROMAX) 500 mg in dextrose 5 % 250 mL IVPB     500 mg 250 mL/hr over 60 Minutes Intravenous Every 24 hours 12/29/14 1713     12/29/14 1530  piperacillin-tazobactam (ZOSYN) IVPB 3.375 g      3.375 g 100 mL/hr over 30 Minutes Intravenous  Once 12/29/14 1518 12/29/14 1658   12/29/14 1515  vancomycin (VANCOCIN) IVPB 1000 mg/200 mL premix     1,000 mg 200 mL/hr over 60 Minutes Intravenous  Once 12/29/14 1514 12/29/14 1658   12/29/14 1515  piperacillin-tazobactam (ZOSYN) IVPB 4.5 g  Status:  Discontinued     4.5 g 200 mL/hr over 30 Minutes Intravenous  Once 12/29/14 1514 12/29/14 1517     Assessment: 44 y.o. female with stage 3+ NSCLC with large RUL mass Dx 06/2014 admitted 12/29/2014 for pneumonia.  Recent cough/fevers have worsened over past week despite treatment with Augmentin.  Coughing up yellow mucus and blood; reports R sided pleuritic pain.  Pharmacy to dose vancomycin and renally adjust antibiotics for pneumonia.  First doses given in ED.  8/27 >> vancomycin >> 8/27 >> Zosyn (MD) >>   8/27 >> azithromycin (MD) >>    Temp: afebrile WBC:  Renal: SCr wnl; CrCl 69 CG LA wnl  8/27 blood: IP 8/27 urine: IP 8/27 sputum: IP S. pneumo UAg: IP Legionella UAg: IP  Dose changes/levels:   Goal of Therapy:  Vancomycin trough level 15-20 mcg/ml  Eradication of infection Appropriate antibiotic dosing for indication and renal function  Plan:  Day 1 antibiotics Vancomycin 750 mg IV q12 hr Measure vancomycin trough levels at steady state as indicated Changed Zosyn to extended-infusion dosing; azithromycin dosing is appropriate  Follow clinical course, renal function, culture results as available  Follow for de-escalation of antibiotics  and LOT   Reuel Boom, PharmD, BCPS Pager: [phone] 12/29/2014, 5:23 PM

## 2014-12-29 NOTE — ED Provider Notes (Signed)
CSN: [id number]     Arrival date & time 12/29/14  1336 History   First MD Initiated Contact with Patient 12/29/14 1457     Chief Complaint  Patient presents with  . Chest Pain  . Coughing Up Blood   . Fever     (Consider location/radiation/quality/duration/timing/severity/associated sxs/prior Treatment) HPI 44 year old female who presents with chest pain and sob. 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 For past month has been having cough, sob and chest pain. Underwent CT chest 12/18/14 showing possible PNA, and started on amoxcillin, with two more days of treatment. Began to cough of blood last night, mucous with blood tinged sputum. Developed 104F as well. Chest pain stabbing, over right chest wall, pleuritic in nature, over one month as well. Comes and goes and worse with cough but not with exertion. Eating and drinking. Denies urinary symptoms, diarrhea, vomiting, nausea, or abdominal pain.  Past Medical History  Diagnosis Date  . S/P radiation therapy 07/10/2014 through 08/20/2014     Right lung/mediastinum 6000 cGy in 30 sessions   . 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 Femur closed reduction Right    Family History  Problem Relation Age of Onset  . Heart attack Mother   . Diabetes Mother   . Heart attack Father    Social History  Substance Use Topics  . Smoking status: Former Smoker -- 1.00 packs/day for 30 years    Types: Cigarettes    Quit date: 06/26/2014  . Smokeless tobacco: Never Used  . Alcohol Use: No     Comment: socially   OB History    No data available     Review of Systems 10/14 systems reviewed and are negative other than those stated in the HPI  Allergies  Keflex  Home Medications   Prior to Admission medications   Medication Sig Start Date End Date Taking? Authorizing Provider  albuterol  (PROVENTIL HFA;VENTOLIN HFA) 108 (90 BASE) MCG/ACT inhaler Inhale 1-2 puffs into the lungs every 6 (six) hours as needed for wheezing or shortness of breath. Use 2 puffs 3 times daily x 4 days, then every 6 hours as needed. 09/06/14  Yes Eugenie Filler, MD  amoxicillin (AMOXIL) 500 MG capsule Take 500 mg by mouth 3 (three) times daily. For 7 days 12/20/14  Yes Historical Provider, MD  aspirin 325 MG EC tablet Take 325 mg by mouth daily.    Yes Historical Provider, MD  docusate sodium (COLACE) 100 MG capsule Take 1 capsule (100 mg total) by mouth 2 (two) times daily. Patient taking differently: Take 100 mg by mouth daily.  07/03/14  Yes Eugenie Filler, MD  ibuprofen (ADVIL,MOTRIN) 200 MG tablet Take 200 mg by mouth every 6 (six) hours as needed for fever, headache, mild pain, moderate pain or cramping.   Yes Historical Provider, MD  Multiple Vitamins-Minerals (ADULT GUMMY) CHEW Chew 1 capsule by mouth daily.   Yes Historical Provider, MD  OxyCODONE (OXYCONTIN) 10 mg T12A 12 hr tablet Take 10 mg by mouth every 12 (twelve) hours.   Yes Historical Provider, MD  oxyCODONE-acetaminophen (PERCOCET) 10-325 MG per tablet Take 1 tablet by mouth every 4 (four) hours as needed for pain.   Yes Historical Provider, MD  potassium chloride 20 MEQ TBCR Take 20 mEq by mouth daily. Patient taking differently: Take 20 mEq by mouth daily. For 7 days 12/24/14  Yes Curt Bears,  MD  [place]   Yes Historical Provider, MD  prochlorperazine (COMPAZINE) 10 MG tablet Take 1 tablet (10 mg total) by mouth every 6 (six) hours as needed for nausea or vomiting. Patient taking differently: Take 10 mg by mouth daily as needed for nausea or vomiting.  07/23/14  Yes Maryanna Shape, NP  ibuprofen (ADVIL,MOTRIN) 600 MG tablet Take 1 tablet (600 mg total) by mouth 4 (four) times daily. Take for 4 days then use as needed. Patient not taking: Reported on 12/29/2014 07/03/14   Eugenie Filler, MD  morphine (MS  CONTIN) 30 MG 12 hr tablet Take 1 tablet (30 mg total) by mouth every 12 (twelve) hours. Patient not taking: Reported on 12/29/2014 12/24/14   Curt Bears, MD  oxyCODONE-acetaminophen (PERCOCET/ROXICET) 5-325 MG per tablet Take 1 tablet by mouth every 6 (six) hours as needed for severe pain. Patient not taking: Reported on 12/29/2014 09/06/14   Eugenie Filler, MD   BP 131/91 mmHg  Pulse 106  Temp(Src) 99 F (37.2 C) (Oral)  Resp 18  SpO2 99%  LMP 07/18/2014 Physical Exam Physical Exam  Nursing note and vitals reviewed. Constitutional:   non-toxic, and in no acute distress Head: Normocephalic and atraumatic.  Mouth/Throat: Oropharynx is clear and dry.  Neck: Normal range of motion. Neck supple.  Cardiovascular: Normal rate and regular rhythm.   Pulmonary/Chest: No conversational dyspnea. Mild tachypnea. No accessory muscle usage. Decreased breath sounds in RUL, crackles and rhonchi and R lower lung.  Abdominal: Soft. There is no tenderness. There is no rebound and no guarding.  Musculoskeletal: Normal range of motion.  Neurological: Alert, no facial droop, fluent speech, moves all extremities symmetrically Skin: Skin is warm and dry.  Psychiatric: Cooperative  ED Course  Procedures (including critical care time) Labs Review Labs Reviewed  COMPREHENSIVE METABOLIC PANEL - Abnormal; Notable for the following:    Glucose, Bld 101 (*)    Calcium 8.5 (*)    Albumin 2.4 (*)    ALT 10 (*)    All other components within normal limits  CBC WITH DIFFERENTIAL/PLATELET - Abnormal; Notable for the following:    WBC 22.1 (*)    RBC 2.71 (*)    Hemoglobin 7.3 (*)    HCT 24.7 (*)    MCHC 29.6 (*)    RDW 19.9 (*)    Platelets 445 (*)    Neutrophils Relative % 84 (*)    Lymphocytes Relative 9 (*)    Neutro Abs 18.6 (*)    Monocytes Absolute 1.3 (*)    All other components within normal limits  CULTURE, BLOOD (ROUTINE X 2)  CULTURE, BLOOD (ROUTINE X 2)  URINE CULTURE  URINALYSIS,  ROUTINE W REFLEX MICROSCOPIC (NOT AT [facility])  I-STAT TROPOININ, ED  I-STAT CG4 LACTIC ACID, ED    Imaging Review Dg Chest 2 View  12/29/2014   CLINICAL DATA:  Constant right chest pain for 3-4 months. Coughing up blood. History of fevers. History of lung cancer. Last chemotherapy 12/03/2014.  EXAM: CHEST  2 VIEW  COMPARISON:  09/01/2014; 08/13/2014; chest CT- 12/21/2014  FINDINGS: Grossly unchanged cardiac silhouette and mediastinal contours. The amount of air and fluid within the cavitary right upper lobe mass appears grossly unchanged when compared to chest CT performed 12/21/2014 with persistent irregular rind like opacification. There is persistent right-sided volume loss with slight worsening of right medial basilar heterogeneous opacities, likely atelectasis. There is grossly unchanged mild diffuse nodular thickening of the pulmonary interstitium  as better demonstrated on preceding chest CT. No definite pleural effusion or evidence of edema. Unchanged bones.  IMPRESSION: No definitive interval change since chest CT performed 12/21/2014. Specifically, no definitive change in known cavitary air and fluid containing mass within the right upper lobe.   Electronically Signed   By: Sandi Mariscal M.D.   On: 12/29/2014 15:05   I have personally reviewed and evaluated these images and lab results as part of my medical decision-making.   EKG Interpretation   Date/Time:  Saturday December 29 2014 13:50:40 EDT Ventricular Rate:  113 PR Interval:  126 QRS Duration: 71 QT Interval:  308 QTC Calculation: 422 R Axis:   70 Text Interpretation:  Sinus tachycardia No significant change since last  tracing Confirmed by Wyat Infinger MD, Shaleen Talamantez [phone]) on 12/29/2014 3:37:55 PM         MDM   Final diagnoses:  SCC of lung (small cell carcinoma), right  Healthcare-associated pneumonia  Sepsis, due to unspecified organism    In short, this is a 44 year old female with history of squamous cell carcinoma of the lung  status post chemotherapy radiation therapy who presents with cough, shortness of breath, chest pain and fever. She had a recent CT scan performed 1 week ago showing evidence of multifocal pneumonia, and now presents with worsening symptoms, and failure of outpatient treatment. She is near febrile on presentation, tachycardic, but normotensive. She is speaking in full sentences, with mild tachypnea, and normal oxygenation on room air. We will broaden out her antibiotics with vancomycin and Zosyn in the setting of her immunosuppression. Given 2 L of NS. She has a normal lactic acid but evidence of leukocytosis of 22, trending from blood work earlier this week. No other evidence of end organ damage at this time. I discussed with [facility] hospitalist who will met this patient for sepsis secondary to multifocal pneumonia that has failed outpatient treatment   Forde Dandy, MD 12/29/14 1654

## 2014-12-29 NOTE — ED Notes (Signed)
Pt is aware of urine sample 

## 2014-12-29 NOTE — ED Notes (Signed)
Pt c/o constant R chest pain x 3-4 months and coughing up blood and fever since last night.  Pain score 9/10.  Brown sputum noted.  Sts fever was 101.7 last night.  Hx of lung CA.  Last chemo 8/1.

## 2014-12-29 NOTE — ED Notes (Signed)
I attempt to phone report to [address] [place].  Nurse will call back for report shortly.  Pt. Thanks [place] for giving her pain med.

## 2014-12-29 NOTE — H&P (Signed)
[facility] History and Physical  Nakiea Metzner LKG:[id number] DOB: 16-Nov-1970 DOA: 12/29/2014   PCP: [name], MD    Chief Complaint: Coughing up blood, fevers  HPI: Barbara Frost is a 44 y.o. female "at least: a stage IIIa (T3, N2, M0) non-small cell lung cancer, squamous cell carcinoma presented with large right upper lobe mass with questionable mediastinal invasion diagnosed in February 2016 per Dr. Worthy Flank notes. She's had cough with fevers for the past couple of weeks and was being treated with Augmentin. Her temperature went up to 104 about 2 days ago. It continues to be about 101 since then. She is coughing up yellow mucus with blood. She has severe right-sided chest pain which started in February and initially was intermittent but now has become more constant and is worse when she coughs. She has been taking OxyContin 10 mg twice a day and Percocets for the pain.   General: The patient denies anorexia, fever, weight loss Cardiac: Denies syncope, palpitations, pedal edema  Respiratory: +cough, shortness of breath, no wheezing GI: Denies severe indigestion/heartburn, abdominal pain, nausea, vomiting, diarrhea and constipation GU: Denies hematuria, incontinence, dysuria  Musculoskeletal: Denies arthritis  Skin: Denies suspicious skin lesions Neurologic: Denies focal weakness or numbness, change in vision Psychiatry: Denies depression or anxiety. Hematologic: no easy bruising or bleeding  All other systems reviewed and found to be negative.  Past Medical History  Diagnosis Date  . S/P radiation therapy 07/10/2014 through 08/20/2014     Right lung/mediastinum 6000 cGy in 30 sessions   . 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 Femur closed reduction Right     Social History:  Social History   Social History  . Marital Status: Divorced     Spouse Name: N/A  . Number of Children: N/A  . Years of Education: N/A   Occupational History  . Not on file.   Social History Main Topics  . Smoking status: Former Smoker -- 1.00 packs/day for 30 years    Types: Cigarettes    Quit date: 06/26/2014  . Smokeless tobacco: Never Used  . Alcohol Use: No     Comment: socially  . Drug Use: No  . Sexual Activity: Not Currently   Other Topics Concern  . Not on file   Social History Narrative     Allergies  Allergen Reactions  . Keflex [Cephalexin] Rash    Family history:   Family History  Problem Relation Age of Onset  . Heart attack Mother   . Diabetes Mother   . Heart attack Father       Prior to Admission medications   Medication Sig Start Date End Date Taking? Authorizing Provider  albuterol (PROVENTIL HFA;VENTOLIN HFA) 108 (90 BASE) MCG/ACT inhaler Inhale 1-2 puffs into the lungs every 6 (six) hours as needed for wheezing or shortness of breath. Use 2 puffs 3 times daily x 4 days, then every 6 hours as needed. 09/06/14  Yes Eugenie Filler, MD  amoxicillin (AMOXIL) 500 MG capsule Take 500 mg by mouth 3 (three) times daily. For 7 days 12/20/14  Yes Historical Provider, MD  aspirin 325 MG EC tablet Take 325 mg by mouth daily.    Yes Historical Provider, MD  docusate sodium (COLACE) 100 MG capsule Take 1 capsule (100 mg total) by mouth 2 (two) times daily. Patient taking differently: Take 100 mg by mouth daily.  07/03/14  Yes Eugenie Filler, MD  ibuprofen (  ADVIL,MOTRIN) 200 MG tablet Take 200 mg by mouth every 6 (six) hours as needed for fever, headache, mild pain, moderate pain or cramping.   Yes Historical Provider, MD  Multiple Vitamins-Minerals (ADULT GUMMY) CHEW Chew 1 capsule by mouth daily.   Yes Historical Provider, MD  OxyCODONE (OXYCONTIN) 10 mg T12A 12 hr tablet Take 10 mg by mouth every 12 (twelve) hours.   Yes Historical Provider, MD  oxyCODONE-acetaminophen (PERCOCET) 10-325 MG per tablet Take 1 tablet by  mouth every 4 (four) hours as needed for pain.   Yes Historical Provider, MD  potassium chloride 20 MEQ TBCR Take 20 mEq by mouth daily. Patient taking differently: Take 20 mEq by mouth daily. For 7 days 12/24/14  Yes Curt Bears, MD  [place]   Yes Historical Provider, MD  prochlorperazine (COMPAZINE) 10 MG tablet Take 1 tablet (10 mg total) by mouth every 6 (six) hours as needed for nausea or vomiting. Patient taking differently: Take 10 mg by mouth daily as needed for nausea or vomiting.  07/23/14  Yes Maryanna Shape, NP  ibuprofen (ADVIL,MOTRIN) 600 MG tablet Take 1 tablet (600 mg total) by mouth 4 (four) times daily. Take for 4 days then use as needed. Patient not taking: Reported on 12/29/2014 07/03/14   Eugenie Filler, MD  morphine (MS CONTIN) 30 MG 12 hr tablet Take 1 tablet (30 mg total) by mouth every 12 (twelve) hours. Patient not taking: Reported on 12/29/2014 12/24/14   Curt Bears, MD  oxyCODONE-acetaminophen (PERCOCET/ROXICET) 5-325 MG per tablet Take 1 tablet by mouth every 6 (six) hours as needed for severe pain. Patient not taking: Reported on 12/29/2014 09/06/14   Eugenie Filler, MD     Physical Exam: Filed Vitals:   12/29/14 1347 12/29/14 1432 12/29/14 1603 12/29/14 1634  BP: 128/82 122/73 138/91 131/91  Pulse: 123 108 110 106  Temp: 98.2 F (36.8 C) 99 F (37.2 C)    TempSrc: Oral Oral    Resp: $Remo'17 28 18 18  'DcOzP$ SpO2: 96% 98% 96% 99%     General: Middle-aged female sitting up in bed initially was in no acute distress but once she started coughing she appeared to be quite uncomfortable from pain. HEENT: Normocephalic and Atraumatic, Mucous membranes pink                PERRLA; EOM intact; No scleral icterus,                 Nares: Patent, Oropharynx: Clear, Fair Dentition                 Neck: FROM, no cervical lymphadenopathy, thyromegaly, carotid bruit or JVD;  Breasts: deferred CHEST WALL: No tenderness  CHEST: Normal  respiration, clear to auscultation bilaterally at the bases-crackles and decreased breath sounds in the right upper lung field as coughed up reddish maroon-colored sputum while I'm in the room HEART: Regular rate and rhythm; no murmurs rubs or gallops  BACK: No kyphosis or scoliosis; no CVA tenderness  GI: Positive Bowel Sounds, soft, non-tender; no masses, no organomegaly Rectal Exam: deferred MSK: No cyanosis, clubbing, or edema Genitalia: not examined  SKIN:  no rash or ulceration  CNS: Alert and Oriented x 4, Nonfocal exam, CN 2-12 intact  Labs on Admission:  Basic Metabolic Panel:  Recent Labs Lab 12/24/14 0836 12/29/14 1535  NA 140 136  K 3.0* 4.0  CL  --  102  CO2 25 25  GLUCOSE 140 101*  BUN 12.8 9  CREATININE 0.7 0.53  CALCIUM 9.2 8.5*   Liver Function Tests:  Recent Labs Lab 12/24/14 0836 12/29/14 1535  AST 12 15  ALT 13 10*  ALKPHOS 97 82  BILITOT 0.31 0.5  PROT 7.3 7.4  ALBUMIN 2.1* 2.4*   No results for input(s): LIPASE, AMYLASE in the last 168 hours. No results for input(s): AMMONIA in the last 168 hours. CBC:  Recent Labs Lab 12/24/14 0836 12/29/14 1535  WBC 18.1* 22.1*  NEUTROABS 15.1* 18.6*  HGB 8.6* 7.3*  HCT 26.6* 24.7*  MCV 84.8 91.1  PLT 580* 445*   Cardiac Enzymes: No results for input(s): CKTOTAL, CKMB, CKMBINDEX, TROPONINI in the last 168 hours.  BNP (last 3 results) No results for input(s): BNP in the last 8760 hours.  ProBNP (last 3 results) No results for input(s): PROBNP in the last 8760 hours.  CBG: No results for input(s): GLUCAP in the last 168 hours.  Radiological Exams on Admission: Dg Chest 2 View  12/29/2014   CLINICAL DATA:  Constant right chest pain for 3-4 months. Coughing up blood. History of fevers. History of lung cancer. Last chemotherapy 12/03/2014.  EXAM: CHEST  2 VIEW  COMPARISON:  09/01/2014; 08/13/2014; chest CT- 12/21/2014  FINDINGS: Grossly unchanged cardiac silhouette and mediastinal contours. The  amount of air and fluid within the cavitary right upper lobe mass appears grossly unchanged when compared to chest CT performed 12/21/2014 with persistent irregular rind like opacification. There is persistent right-sided volume loss with slight worsening of right medial basilar heterogeneous opacities, likely atelectasis. There is grossly unchanged mild diffuse nodular thickening of the pulmonary interstitium as better demonstrated on preceding chest CT. No definite pleural effusion or evidence of edema. Unchanged bones.  IMPRESSION: No definitive interval change since chest CT performed 12/21/2014. Specifically, no definitive change in known cavitary air and fluid containing mass within the right upper lobe.   Electronically Signed   By: Sandi Mariscal M.D.   On: 12/29/2014 15:05    EKG: Independently reviewed. Sinus tachy at 113 bpm  Assessment/Plan Principal Problem: Pneumonia, fevers, leukocytosis -Mildly tachycardic likely because of pain-not hypotensive- the ER doctor has given HER-2 liters of normal saline for her tachycardia -CT performed on 8/27 revealed multifocal infiltrates lung with cavitation of a large right apical mass with air-fluid level -Failed Augmentin treatment-is likely immune compromised from recent chemotherapy and therefore, she is been placed on vancomycin and Zosyn-I will add azithromycin -check urinary strep and Legionella antigens, obtain sputum culture -Will consider pulmonary eval for bronchoscopy if symptoms do not improve -Tussionex and Tessalon Perles for cough  Active Problems: Right-sided chest pain -Relation to cancer and possibly also pleuritic -Increase OxyContin to 20 mg twice a day-change Percocet to oxycodone (so that fevers are not masked) at a dose of 10 mg every 4 when necessary-when necessary morphine for severe breakthrough pain    Non-small cell carcinoma of lung, stage 3 -Follows with Dr. Earlie Server - She has had radiation and has recently completed  chemotherapy -Currently disease is "stable" and she will have a repeat CT in 3 months   Consulted:   Code Status: Full code  Family Communication: Mother at bedside DVT Prophylaxis: SCDs  Time spent: 47 minutes  [place], MD [facility]  If 7PM-7AM, please contact night-coverage [email or web address] 12/29/2014, 5:04 PM

## 2014-12-29 NOTE — ED Notes (Signed)
I have just given report to Legrand Rams, RN on [address].  Will transport shortly.

## 2014-12-30 DIAGNOSIS — R0781 Pleurodynia: Secondary | ICD-10-CM

## 2014-12-30 LAB — CBC
HCT: 26.6 % — ABNORMAL LOW (ref 36.0–46.0)
Hemoglobin: 7.7 g/dL — ABNORMAL LOW (ref 12.0–15.0)
MCH: 25.9 pg — ABNORMAL LOW (ref 26.0–34.0)
MCHC: 28.9 g/dL — ABNORMAL LOW (ref 30.0–36.0)
MCV: 89.6 fL (ref 78.0–100.0)
Platelets: 498 10*3/uL — ABNORMAL HIGH (ref 150–400)
RBC: 2.97 MIL/uL — ABNORMAL LOW (ref 3.87–5.11)
RDW: 19.6 % — ABNORMAL HIGH (ref 11.5–15.5)
WBC: 21.4 10*3/uL — ABNORMAL HIGH (ref 4.0–10.5)

## 2014-12-30 LAB — BASIC METABOLIC PANEL
Anion gap: 6 (ref 5–15)
BUN: <5 mg/dL — ABNORMAL LOW (ref 6–20)
CO2: 29 mmol/L (ref 22–32)
Calcium: 8.6 mg/dL — ABNORMAL LOW (ref 8.9–10.3)
Chloride: 101 mmol/L (ref 101–111)
Creatinine, Ser: 0.55 mg/dL (ref 0.44–1.00)
GFR calc Af Amer: >60 mL/min (ref >60)
GFR calc non Af Amer: >60 mL/min (ref >60)
Glucose, Bld: 112 mg/dL — ABNORMAL HIGH (ref 65–99)
Potassium: 4.3 mmol/L (ref 3.5–5.1)
Sodium: 136 mmol/L (ref 135–145)

## 2014-12-30 LAB — STREP PNEUMONIAE URINARY ANTIGEN: Strep Pneumo Urinary Antigen: NEGATIVE

## 2014-12-30 MED ORDER — OXYCODONE HCL ER 15 MG PO T12A
30.0000 mg | EXTENDED_RELEASE_TABLET | Freq: Two times a day (BID) | ORAL | Status: DC
  Administered 2014-12-30 (×2): 30 mg via ORAL
  Filled 2014-12-30 (×2): qty 2

## 2014-12-30 NOTE — Progress Notes (Signed)
[facility] Progress Note   Krithi Bray  ONG:[id number]  DOB: 16-Jun-1970  DOA: 12/29/2014 PCP: [name], MD  Brief narrative: Barbara Frost is a 44 y.o. female  with "at least: a stage IIIa (T3, N2, M0) non-small cell lung cancer, squamous cell carcinoma presented with large right upper lobe mass with questionable mediastinal invasion diagnosed in February 2016 per Dr. Worthy Flank notes. Admitted for severe cough with bloody/yellow sputum and fevers of 101-104 despite being on Augmentin. Recent CT scan as outpatient revealed multifocal infiltrates consistent with a pneumonia.   Subjective: Sputum is yellow now and no longer bloody. Continues to have severe chest pain and was not able to sleep through the night. No other complaints  Assessment/Plan: Principal Problem: Pneumonia-  fevers, leukocytosis, hemoptysis -Mildly tachycardic likely because of pain-not hypotensive- large amounts of yellow sputum now non-bloody  -CT performed on 8/27 revealed multifocal infiltrates lung with cavitation of a large right apical mass with air-fluid level which is likely the main source of infection -Failed Augmentin treatment-is likely immune compromised from recent chemotherapy and therefore, she is been placed on vancomycin, Zosyn,  azithromycin - urinary strep neg -  Legionella pending - obtained sputum culture -Tussionex and Tessalon Perles for cough - have asked pulm f/u to ensure that she has the appropriate treatment and follow up to properly treat this  Gr pos rods in 1 set of blood cultures - likely contaminant- follow  Right-sided chest pain -in Relation to cancer and possibly also pleuritic -Increase OxyContin to 30 mg twice a day today -cont oxycodone (no percocet so that fevers are not masked) at a dose of 10 mg every 4 when necessary -when necessary morphine for severe breakthrough pain   Non-small cell carcinoma of lung, stage 3 -Follows with Dr. Earlie Server - She has had  radiation and has recently completed chemotherapy -Currently disease is "stable" and she will have a repeat CT in 3 months     Code Status:     Code Status Orders        Start     Ordered   12/29/14 1713  Full code   Continuous     12/29/14 1713     Family Communication: mother Disposition Plan: home when stable DVT prophylaxis: SCDS Consultants: Pulm to see in AM Procedures: Appt with PCP: requested Antibiotics: Anti-infectives    Start     Dose/Rate Route Frequency Ordered Stop   12/30/14 0400  vancomycin (VANCOCIN) IVPB 750 mg/150 ml premix     750 mg 150 mL/hr over 60 Minutes Intravenous Every 12 hours 12/29/14 1809     12/29/14 2200  piperacillin-tazobactam (ZOSYN) IVPB 3.375 g     3.375 g 12.5 mL/hr over 240 Minutes Intravenous 3 times per day 12/29/14 1807     12/29/14 1715  piperacillin-tazobactam (ZOSYN) IVPB 3.375 g  Status:  Discontinued     3.375 g 100 mL/hr over 30 Minutes Intravenous 3 times per day 12/29/14 1713 12/29/14 1807   12/29/14 1715  azithromycin (ZITHROMAX) 500 mg in dextrose 5 % 250 mL IVPB     500 mg 250 mL/hr over 60 Minutes Intravenous Every 24 hours 12/29/14 1713     12/29/14 1530  piperacillin-tazobactam (ZOSYN) IVPB 3.375 g     3.375 g 100 mL/hr over 30 Minutes Intravenous  Once 12/29/14 1518 12/29/14 1658   12/29/14 1515  vancomycin (VANCOCIN) IVPB 1000 mg/200 mL premix     1,000 mg 200 mL/hr over 60 Minutes Intravenous  Once 12/29/14 1514 12/29/14  1658   12/29/14 1515  piperacillin-tazobactam (ZOSYN) IVPB 4.5 g  Status:  Discontinued     4.5 g 200 mL/hr over 30 Minutes Intravenous  Once 12/29/14 1514 12/29/14 1517      Objective: Filed Weights   12/29/14 1805  Weight: 56.019 kg (123 lb 8 oz)   No intake or output data in the 24 hours ending 12/30/14 [place]:   12/29/14 1715 12/29/14 1805 12/29/14 2008 12/30/14 0507  BP: 151/98 152/103 147/94 156/100  Pulse: 110 124 117 122  Temp: 99.1 F (37.3 C) 99  F (37.2 C) 99.6 F (37.6 C) 100.8 F (38.2 C)  TempSrc: Oral Oral Oral Oral  Resp: '15 24 24 20  '$ Height:  5' (1.524 m)    Weight:  56.019 kg (123 lb 8 oz)    SpO2: 99% 100% 100% 100%    Exam:  General:  Pt is alert, not in acute distress  HEENT: No icterus, No thrush, oral mucosa moist  Cardiovascular: regular rate and rhythm, S1/S2 No murmur  Respiratory: clear to auscultation bilaterally   Abdomen: Soft, +Bowel sounds, non tender, non distended, no guarding  MSK: No LE edema, cyanosis or clubbing  Data Reviewed: Basic Metabolic Panel:  Recent Labs Lab 12/24/14 0836 12/29/14 1535 12/30/14 0819  NA 140 136 136  K 3.0* 4.0 4.3  CL  --  102 101  CO2 '25 25 29  '$ GLUCOSE 140 101* 112*  BUN 12.8 9 <5*  CREATININE 0.7 0.53 0.55  CALCIUM 9.2 8.5* 8.6*   Liver Function Tests:  Recent Labs Lab 12/24/14 0836 12/29/14 1535  AST 12 15  ALT 13 10*  ALKPHOS 97 82  BILITOT 0.31 0.5  PROT 7.3 7.4  ALBUMIN 2.1* 2.4*   No results for input(s): LIPASE, AMYLASE in the last 168 hours. No results for input(s): AMMONIA in the last 168 hours. CBC:  Recent Labs Lab 12/24/14 0836 12/29/14 1535 12/30/14 0819  WBC 18.1* 22.1* 21.4*  NEUTROABS 15.1* 18.6*  --   HGB 8.6* 7.3* 7.7*  HCT 26.6* 24.7* 26.6*  MCV 84.8 91.1 89.6  PLT 580* 445* 498*   Cardiac Enzymes: No results for input(s): CKTOTAL, CKMB, CKMBINDEX, TROPONINI in the last 168 hours. BNP (last 3 results) No results for input(s): BNP in the last 8760 hours.  ProBNP (last 3 results) No results for input(s): PROBNP in the last 8760 hours.  CBG: No results for input(s): GLUCAP in the last 168 hours.  Recent Results (from the past 240 hour(s))  TECHNOLOGIST REVIEW     Status: None   Collection Time: 12/24/14  8:36 AM  Result Value Ref Range Status   Technologist Review Rare metamyelocyte present  Final  Blood culture (routine x 2)     Status: None (Preliminary result)   Collection Time: 12/29/14  3:25 PM   Result Value Ref Range Status   Specimen Description BLOOD RIGHT ANTECUBITAL  Final   Special Requests BOTTLES DRAWN AEROBIC AND ANAEROBIC 5CC  Final   Culture PENDING  Incomplete   Report Status PENDING  Incomplete  Blood culture (routine x 2)     Status: None (Preliminary result)   Collection Time: 12/29/14  3:33 PM  Result Value Ref Range Status   Specimen Description BLOOD RIGHT HAND  Final   Special Requests BOTTLES DRAWN AEROBIC AND ANAEROBIC 3CC  Final   Culture  Setup Time   Final    GRAM POSITIVE RODS AEROBIC BOTTLE ONLY CRITICAL RESULT CALLED  TO, READ BACK BY AND VERIFIED WITH: Kristie Cowman [id number] [place] Performed at [facility]    Culture PENDING  Incomplete   Report Status PENDING  Incomplete  Culture, sputum-assessment     Status: None   Collection Time: 12/29/14 11:15 PM  Result Value Ref Range Status   Specimen Description SPU  Final   Special Requests Immunocompromised  Final   Sputum evaluation THIS SPECIMEN IS ACCEPTABLE FOR SPUTUM CULTURE  Final   Report Status 12/29/2014 FINAL  Final     Studies: Dg Chest 2 View  12/29/2014   CLINICAL DATA:  Constant right chest pain for 3-4 months. Coughing up blood. History of fevers. History of lung cancer. Last chemotherapy 12/03/2014.  EXAM: CHEST  2 VIEW  COMPARISON:  09/01/2014; 08/13/2014; chest CT- 12/21/2014  FINDINGS: Grossly unchanged cardiac silhouette and mediastinal contours. The amount of air and fluid within the cavitary right upper lobe mass appears grossly unchanged when compared to chest CT performed 12/21/2014 with persistent irregular rind like opacification. There is persistent right-sided volume loss with slight worsening of right medial basilar heterogeneous opacities, likely atelectasis. There is grossly unchanged mild diffuse nodular thickening of the pulmonary interstitium as better demonstrated on preceding chest CT. No definite pleural effusion or evidence of edema. Unchanged bones.   IMPRESSION: No definitive interval change since chest CT performed 12/21/2014. Specifically, no definitive change in known cavitary air and fluid containing mass within the right upper lobe.   Electronically Signed   By: Sandi Mariscal M.D.   On: 12/29/2014 15:05    Scheduled Meds:  Scheduled Meds: . azithromycin  500 mg Intravenous Q24H  . benzonatate  100 mg Oral TID  . chlorpheniramine-HYDROcodone  5 mL Oral Q12H  . OxyCODONE  30 mg Oral Q12H  . piperacillin-tazobactam (ZOSYN)  IV  3.375 g Intravenous 3 times per day  . vancomycin  750 mg Intravenous Q12H   Continuous Infusions: . sodium chloride 20 mL/hr at 12/29/14 1921    Time spent on care of this patient: 41 min   [place], MD 12/30/2014, 9:54 AM  LOS: 1 day   [facility] Office  [phone] Pager - Text Page per [email or web address] If 7PM-7AM, please contact night-coverage [email or web address]

## 2014-12-30 NOTE — Progress Notes (Signed)
Utilization review completed.  

## 2014-12-30 NOTE — Progress Notes (Signed)
CRITICAL VALUE ALERT  Critical value received:  Positive blood culture, aerobic bottle, gram positive rods  Date of notification:  12/30/14  Time of notification:  0646  Critical value read back:Yes.    Nurse who received alert:  Hortencia Conradi RN   MD notified (1st page):  Charlies Silvers  Time of first page:  [phone]  MD notified (2nd page):  Time of second page:  Responding MD:   Time MD responded:

## 2014-12-31 DIAGNOSIS — J852 Abscess of lung without pneumonia: Secondary | ICD-10-CM

## 2014-12-31 DIAGNOSIS — D649 Anemia, unspecified: Secondary | ICD-10-CM

## 2014-12-31 LAB — URINE CULTURE: Culture: NO GROWTH

## 2014-12-31 LAB — CBC
HCT: 26.4 % — ABNORMAL LOW (ref 36.0–46.0)
Hemoglobin: 7.6 g/dL — ABNORMAL LOW (ref 12.0–15.0)
MCH: 25.8 pg — AB (ref 26.0–34.0)
MCHC: 28.8 g/dL — AB (ref 30.0–36.0)
MCV: 89.5 fL (ref 78.0–100.0)
PLATELETS: 498 10*3/uL — AB (ref 150–400)
RBC: 2.95 MIL/uL — AB (ref 3.87–5.11)
RDW: 19.8 % — AB (ref 11.5–15.5)
WBC: 18.3 10*3/uL — ABNORMAL HIGH (ref 4.0–10.5)

## 2014-12-31 LAB — BASIC METABOLIC PANEL
Anion gap: 10 (ref 5–15)
BUN: 7 mg/dL (ref 6–20)
CALCIUM: 8.7 mg/dL — AB (ref 8.9–10.3)
CO2: 28 mmol/L (ref 22–32)
CREATININE: 0.63 mg/dL (ref 0.44–1.00)
Chloride: 98 mmol/L — ABNORMAL LOW (ref 101–111)
GFR calc non Af Amer: >60 mL/min (ref >60)
GLUCOSE: 103 mg/dL — AB (ref 65–99)
Potassium: 4.2 mmol/L (ref 3.5–5.1)
Sodium: 136 mmol/L (ref 135–145)

## 2014-12-31 LAB — LEGIONELLA ANTIGEN, URINE

## 2014-12-31 LAB — FERRITIN: Ferritin: 1029 ng/mL — ABNORMAL HIGH (ref 11–307)

## 2014-12-31 LAB — VITAMIN B12: VITAMIN B 12: 895 pg/mL (ref 180–914)

## 2014-12-31 LAB — HIV ANTIBODY (ROUTINE TESTING W REFLEX): HIV SCREEN 4TH GENERATION: NONREACTIVE

## 2014-12-31 LAB — FOLATE: FOLATE: 12.7 ng/mL (ref >5.9)

## 2014-12-31 LAB — RETICULOCYTES
RBC.: 3.04 MIL/uL — AB (ref 3.87–5.11)
RETIC COUNT ABSOLUTE: 112.5 10*3/uL (ref 19.0–186.0)
Retic Ct Pct: 3.7 % — ABNORMAL HIGH (ref 0.4–3.1)

## 2014-12-31 LAB — IRON AND TIBC
Iron: 26 ug/dL — ABNORMAL LOW (ref 28–170)
Saturation Ratios: 15 % (ref 10.4–31.8)
TIBC: 172 ug/dL — ABNORMAL LOW (ref 250–450)
UIBC: 146 ug/dL

## 2014-12-31 LAB — HEMOGLOBIN AND HEMATOCRIT, BLOOD
HEMATOCRIT: 30.9 % — AB (ref 36.0–46.0)
Hemoglobin: 9.4 g/dL — ABNORMAL LOW (ref 12.0–15.0)

## 2014-12-31 LAB — PREPARE RBC (CROSSMATCH)

## 2014-12-31 MED ORDER — DOCUSATE SODIUM 100 MG PO CAPS
100.0000 mg | ORAL_CAPSULE | Freq: Two times a day (BID) | ORAL | Status: DC
  Administered 2014-12-31 – 2015-01-04 (×9): 100 mg via ORAL
  Filled 2014-12-31 (×11): qty 1

## 2014-12-31 MED ORDER — SODIUM CHLORIDE 0.9 % IV SOLN
Freq: Once | INTRAVENOUS | Status: AC
  Administered 2014-12-31: 10:00:00 via INTRAVENOUS

## 2014-12-31 MED ORDER — OXYCODONE HCL ER 40 MG PO T12A
40.0000 mg | EXTENDED_RELEASE_TABLET | Freq: Two times a day (BID) | ORAL | Status: DC
  Administered 2014-12-31 – 2015-01-01 (×3): 40 mg via ORAL
  Filled 2014-12-31 (×3): qty 1

## 2014-12-31 NOTE — Consult Note (Signed)
Name: Barbara Frost MRN: [id number] DOB: Oct 02, 1970    ADMISSION DATE:  12/29/2014 CONSULTATION DATE:  12/31/2014  REFERRING MD :  [facility]  CHIEF COMPLAINT:  Bloody sputum   HISTORY OF PRESENT ILLNESS:  44 year old smoker who presented with 10 cm dense right upper lobe mass in February 2016-this was diagnosed as squamous cell carcinoma with a CT-guided biopsy PET scan did not show mediastinal invasion-felt to be stage IIIa. She underwent concurrent chemoradiation-6 cycles of chemotherapy with last dose 08/20/14 and then 3 cycles of consolidation chemotherapy.  Review of serial imaging scans show cavitation of the right upper lobe lesion. She complains of a chronic cough productive of clear sputum over the past few months-over the past few days this sputum has turned yellow to green, and finally bloody over the last 2 days She also reports right-sided chest pain nonradiating-she was started by her PCP on Augmentin, she was also maintained on MS Contin by her oncologist for chest pain. She reports a fever for 2 days prior to admission, MAXIMUM TEMPERATURE was 100 in the hospital   SIGNIFICANT EVENTS    STUDIES:  CT chest 12/21/14 - large right upper lobe cavity with thick walls-unchanged from prior . The consolidation in superior segment of the right lower lobe, with multiple nodular airspace disease throughout the lungs  Blood cx 8/27 >> bacillus  PAST MEDICAL HISTORY :   has a past medical history of S/P radiation therapy (07/10/2014 through 08/20/2014 ) and Squamous cell carcinoma of right lung (06/29/14).  has past surgical history that includes Femur Closed Reduction (Right). Prior to Admission medications   Medication Sig Start Date End Date Taking? Authorizing Provider  albuterol (PROVENTIL HFA;VENTOLIN HFA) 108 (90 BASE) MCG/ACT inhaler Inhale 1-2 puffs into the lungs every 6 (six) hours as needed for wheezing or shortness of breath.  Use 2 puffs 3 times daily x 4 days, then every 6 hours as needed. 09/06/14  Yes Eugenie Filler, MD  amoxicillin (AMOXIL) 500 MG capsule Take 500 mg by mouth 3 (three) times daily. For 7 days 12/20/14  Yes Historical Provider, MD  aspirin 325 MG EC tablet Take 325 mg by mouth daily.    Yes Historical Provider, MD  docusate sodium (COLACE) 100 MG capsule Take 1 capsule (100 mg total) by mouth 2 (two) times daily. Patient taking differently: Take 100 mg by mouth daily.  07/03/14  Yes Eugenie Filler, MD  ibuprofen (ADVIL,MOTRIN) 200 MG tablet Take 200 mg by mouth every 6 (six) hours as needed for fever, headache, mild pain, moderate pain or cramping.   Yes Historical Provider, MD  Multiple Vitamins-Minerals (ADULT GUMMY) CHEW Chew 1 capsule by mouth daily.   Yes Historical Provider, MD  OxyCODONE (OXYCONTIN) 10 mg T12A 12 hr tablet Take 10 mg by mouth every 12 (twelve) hours.   Yes Historical Provider, MD  oxyCODONE-acetaminophen (PERCOCET) 10-325 MG per tablet Take 1 tablet by mouth every 4 (four) hours as needed for pain.   Yes Historical Provider, MD  potassium chloride 20 MEQ TBCR Take 20 mEq by mouth daily. Patient taking differently: Take 20 mEq by mouth daily. For 7 days 12/24/14  Yes Curt Bears, MD  [place]   Yes Historical Provider, MD  prochlorperazine (COMPAZINE) 10 MG tablet Take 1 tablet (10 mg total) by mouth every 6 (six) hours as needed for nausea or vomiting. Patient taking differently: Take 10 mg by mouth daily as needed for nausea or vomiting.  07/23/14  Yes Maryanna Shape, NP  ibuprofen (ADVIL,MOTRIN) 600 MG tablet Take 1 tablet (600 mg total) by mouth 4 (four) times daily. Take for 4 days then use as needed. Patient not taking: Reported on 12/29/2014 07/03/14   Eugenie Filler, MD  morphine (MS CONTIN) 30 MG 12 hr tablet Take 1 tablet (30 mg total) by mouth every 12 (twelve) hours. Patient not taking: Reported on 12/29/2014 12/24/14   Curt Bears,  MD  oxyCODONE-acetaminophen (PERCOCET/ROXICET) 5-325 MG per tablet Take 1 tablet by mouth every 6 (six) hours as needed for severe pain. Patient not taking: Reported on 12/29/2014 09/06/14   Eugenie Filler, MD   Allergies  Allergen Reactions  . Keflex [Cephalexin] Rash    FAMILY HISTORY:  family history includes Diabetes in her mother; Heart attack in her father and mother. SOCIAL HISTORY:  reports that she quit smoking about 6 months ago. Her smoking use included Cigarettes. She has a 30 pack-year smoking history. She has never used smokeless tobacco. She reports that she does not drink alcohol or use illicit drugs.  REVIEW OF SYSTEMS:   Constitutional:Positive  for fever, chills, weight loss, malaise/fatigue and diaphoresis.  HENT: Negative for hearing loss, ear pain, nosebleeds, congestion, sore throat, neck pain, tinnitus and ear discharge.   Eyes: Negative for blurred vision, double vision, photophobia, pain, discharge and redness.  Respiratory:Positive  for cough, hemoptysis, sputum production, shortness of breath, no wheezing and stridor.   Cardiovascular: Negative for  palpitations, orthopnea, claudication, leg swelling and PND.  Gastrointestinal: Negative for heartburn, nausea, vomiting, abdominal pain, diarrhea, constipation, blood in stool and melena.  Genitourinary: Negative for dysuria, urgency, frequency, hematuria and flank pain.  Musculoskeletal: Negative for myalgias, back pain, joint pain and falls.  Skin: Negative for itching and rash.  Neurological: Negative for dizziness, tingling, tremors, sensory change, speech change, focal weakness, seizures, loss of consciousness, weakness and headaches.  Endo/Heme/Allergies: Negative for environmental allergies and polydipsia. Does not bruise/bleed easily.  SUBJECTIVE:   VITAL SIGNS: Temp:  [98.1 F (36.7 C)-100 F (37.8 C)] 99.4 F (37.4 C) (08/29 0536) Pulse Rate:  [113-121] 113 (08/29 0536) Resp:  [18-20] 18 (08/29  0536) BP: (121-147)/(82-90) 121/82 mmHg (08/29 0536) SpO2:  [93 %-98 %] 94 % (08/29 0536)  PHYSICAL EXAMINATION: Gen. Pleasant, well-nourished, in no distress, normal affect ENT - no lesions, no post nasal drip Neck: No JVD, no thyromegaly, no carotid bruits Lungs: no use of accessory muscles, no dullness to percussion, clear without rales or rhonchi , increased vocal resonance right suprascapular Cardiovascular: Rhythm regular, heart sounds  normal, no murmurs, no peripheral edema Abdomen: soft and non-tender, no hepatosplenomegaly, BS normal. Musculoskeletal: No deformities, no cyanosis or clubbing Neuro:  alert, non focal Skin:  Warm, no lesions/ rash     Recent Labs Lab 12/29/14 1535 12/30/14 0819 12/31/14 0421  NA 136 136 136  K 4.0 4.3 4.2  CL 102 101 98*  CO2 '25 29 28  '$ BUN 9 <5* 7  CREATININE 0.53 0.55 0.63  GLUCOSE 101* 112* 103*    Recent Labs Lab 12/29/14 1535 12/30/14 0819 12/31/14 0421  HGB 7.3* 7.7* 7.6*  HCT 24.7* 26.6* 26.4*  WBC 22.1* 21.4* 18.3*  PLT 445* 498* 498*   Dg Chest 2 View  12/29/2014   CLINICAL DATA:  Constant right chest pain for 3-4 months. Coughing up blood. History of fevers. History of lung cancer. Last chemotherapy 12/03/2014.  EXAM: CHEST  2 VIEW  COMPARISON:  09/01/2014; 08/13/2014; chest CT-  12/21/2014  FINDINGS: Grossly unchanged cardiac silhouette and mediastinal contours. The amount of air and fluid within the cavitary right upper lobe mass appears grossly unchanged when compared to chest CT performed 12/21/2014 with persistent irregular rind like opacification. There is persistent right-sided volume loss with slight worsening of right medial basilar heterogeneous opacities, likely atelectasis. There is grossly unchanged mild diffuse nodular thickening of the pulmonary interstitium as better demonstrated on preceding chest CT. No definite pleural effusion or evidence of edema. Unchanged bones.  IMPRESSION: No definitive interval  change since chest CT performed 12/21/2014. Specifically, no definitive change in known cavitary air and fluid containing mass within the right upper lobe.   Electronically Signed   By: Sandi Mariscal M.D.   On: 12/29/2014 15:05    ASSESSMENT / PLAN:  Right upper lobe cavitary lesion- related to necrosis of tumor post chemoradiation We'll treat for superinfection as lung abscess -Bacillus 1/2 in blood culture could be contaminant  Recommend- agree with IV antibiotics for 48 hours, would then change to oral antibiotics for a longer period 2-4 weeks depending on clinical improvement -Can use oral Augmentin-or alternatively Levaquin and clindamycin -  -We will arrange for outpatient follow-up   Kara Mead MD. FCCP. [facility] Pager [phone] If no response call [phone]   12/31/2014, 12:04 PM

## 2014-12-31 NOTE — Progress Notes (Signed)
[facility] Progress Note   Barbara Frost  WCB:[id number]  DOB: 03/18/1971  DOA: 12/29/2014 PCP: [name], MD  Brief narrative: Barbara Frost is a 44 y.o. female  with "at least" a stage IIIa (T3, N2, M0) non-small cell lung cancer, squamous cell carcinoma presented with large right upper lobe mass with questionable mediastinal invasion diagnosed in February 2016 per Dr. Worthy Flank notes. Admitted for severe cough with bloody/yellow sputum and fevers of 101-104 despite being on Augmentin. Recent CT scan as outpatient revealed multifocal infiltrates and a air fluid level in RUL necrotic mass.   Subjective: Still having blood tinged sputum- chest pain improving ans she was able to sleep last night but still very uncomfortable  Assessment/Plan: Principal Problem: Pneumonia-  fevers, leukocytosis, hemoptysis -Continues to be mildly tachycardic likely because of pain-not hypotensive- large amounts of yellow sputum -is bloody  -Temperature down to 99 - 100 -CT performed on 8/27 revealed multifocal infiltrates lung with cavitation of a large right apical mass with air-fluid level which is likely the main source of infection -Failed Augmentin treatment-is likely immune compromised from recent chemotherapy and therefore, she is been placed on vancomycin, Zosyn,  azithromycin - urinary strep neg -  Legionella pending - obtained sputum culture -Tussionex and Tessalon Perles for cough - have asked pulm f/u to ensure that she has the appropriate treatment and follow up to properly treat this  Gr pos rods in 1 set of blood cultures - likely contaminant- follow  Right-sided chest pain -in Relation to cancer and possibly also pleuritic -Increase OxyContin to 40 mg twice a day today -cont oxycodone (no percocet so that fevers are not masked) at a dose of 10 mg every 4 when necessary -when necessary morphine for severe breakthrough pain   Non-small cell carcinoma of lung, stage 3 -Follows  with Dr. Earlie Server - She has had radiation and has recently completed chemotherapy -Currently disease is "stable" and she will have a repeat CT in 3 months  Anemia -Likely secondary to acute illness-transfuse 1 unit packed red blood cells today-check anemia panel     Code Status:     Code Status Orders        Start     Ordered   12/29/14 1713  Full code   Continuous     12/29/14 1713     Family Communication: mother Disposition Plan: home when stable DVT prophylaxis: SCDS Consultants: Pulm Procedures: Appt with PCP: requested Antibiotics: Anti-infectives    Start     Dose/Rate Route Frequency Ordered Stop   12/30/14 0400  vancomycin (VANCOCIN) IVPB 750 mg/150 ml premix     750 mg 150 mL/hr over 60 Minutes Intravenous Every 12 hours 12/29/14 1809     12/29/14 2200  piperacillin-tazobactam (ZOSYN) IVPB 3.375 g     3.375 g 12.5 mL/hr over 240 Minutes Intravenous 3 times per day 12/29/14 1807     12/29/14 1715  piperacillin-tazobactam (ZOSYN) IVPB 3.375 g  Status:  Discontinued     3.375 g 100 mL/hr over 30 Minutes Intravenous 3 times per day 12/29/14 1713 12/29/14 1807   12/29/14 1715  azithromycin (ZITHROMAX) 500 mg in dextrose 5 % 250 mL IVPB     500 mg 250 mL/hr over 60 Minutes Intravenous Every 24 hours 12/29/14 1713     12/29/14 1530  piperacillin-tazobactam (ZOSYN) IVPB 3.375 g     3.375 g 100 mL/hr over 30 Minutes Intravenous  Once 12/29/14 1518 12/29/14 1658   12/29/14 1515  vancomycin (VANCOCIN) IVPB 1000  mg/200 mL premix     1,000 mg 200 mL/hr over 60 Minutes Intravenous  Once 12/29/14 1514 12/29/14 1658   12/29/14 1515  piperacillin-tazobactam (ZOSYN) IVPB 4.5 g  Status:  Discontinued     4.5 g 200 mL/hr over 30 Minutes Intravenous  Once 12/29/14 1514 12/29/14 1517      Objective: Filed Weights   12/29/14 1805  Weight: 56.019 kg (123 lb 8 oz)    Intake/Output Summary (Last 24 hours) at 12/31/14 1134 Last data filed at 12/31/14 0736  Gross per 24  hour  Intake   1523 ml  Output      0 ml  Net   1523 ml     Vitals Filed Vitals:   12/30/14 0507 12/30/14 1310 12/30/14 2034 12/31/14 0536  BP: 156/100 129/86 147/90 121/82  Pulse: 122 121 121 113  Temp: 100.8 F (38.2 C) 98.1 F (36.7 C) 100 F (37.8 C) 99.4 F (37.4 C)  TempSrc: Oral Oral Oral Oral  Resp: '20 20 18 18  '$ Height:      Weight:      SpO2: 100% 93% 98% 94%    Exam:  General:  Pt is alert, not in acute distress  HEENT: No icterus, No thrush, oral mucosa moist  Cardiovascular: regular rate and rhythm, S1/S2 No murmur  Respiratory: clear to auscultation bilaterally   Abdomen: Soft, +Bowel sounds, non tender, non distended, no guarding  MSK: No LE edema, cyanosis or clubbing  Data Reviewed: Basic Metabolic Panel:  Recent Labs Lab 12/29/14 1535 12/30/14 0819 12/31/14 0421  NA 136 136 136  K 4.0 4.3 4.2  CL 102 101 98*  CO2 '25 29 28  '$ GLUCOSE 101* 112* 103*  BUN 9 <5* 7  CREATININE 0.53 0.55 0.63  CALCIUM 8.5* 8.6* 8.7*   Liver Function Tests:  Recent Labs Lab 12/29/14 1535  AST 15  ALT 10*  ALKPHOS 82  BILITOT 0.5  PROT 7.4  ALBUMIN 2.4*   No results for input(s): LIPASE, AMYLASE in the last 168 hours. No results for input(s): AMMONIA in the last 168 hours. CBC:  Recent Labs Lab 12/29/14 1535 12/30/14 0819 12/31/14 0421  WBC 22.1* 21.4* 18.3*  NEUTROABS 18.6*  --   --   HGB 7.3* 7.7* 7.6*  HCT 24.7* 26.6* 26.4*  MCV 91.1 89.6 89.5  PLT 445* 498* 498*   Cardiac Enzymes: No results for input(s): CKTOTAL, CKMB, CKMBINDEX, TROPONINI in the last 168 hours. BNP (last 3 results) No results for input(s): BNP in the last 8760 hours.  ProBNP (last 3 results) No results for input(s): PROBNP in the last 8760 hours.  CBG: No results for input(s): GLUCAP in the last 168 hours.  Recent Results (from the past 240 hour(s))  TECHNOLOGIST REVIEW     Status: None   Collection Time: 12/24/14  8:36 AM  Result Value Ref Range Status    Technologist Review Rare metamyelocyte present  Final  Blood culture (routine x 2)     Status: None (Preliminary result)   Collection Time: 12/29/14  3:25 PM  Result Value Ref Range Status   Specimen Description BLOOD RIGHT ANTECUBITAL  Final   Special Requests BOTTLES DRAWN AEROBIC AND ANAEROBIC 5CC  Final   Culture   Final    NO GROWTH < 24 HOURS Performed at [facility]    Report Status PENDING  Incomplete  Blood culture (routine x 2)     Status: None (Preliminary result)   Collection Time: 12/29/14  3:33 PM  Result Value Ref Range Status   Specimen Description BLOOD RIGHT HAND  Final   Special Requests BOTTLES DRAWN AEROBIC AND ANAEROBIC 3CC  Final   Culture  Setup Time   Final    GRAM POSITIVE RODS AEROBIC BOTTLE ONLY CRITICAL RESULT CALLED TO, READ BACK BY AND VERIFIED WITH: Kristie Cowman [id number] [place]    Culture   Final    BACILLUS SPECIES Standardized susceptibility testing for this organism is not available. Performed at [facility]    Report Status PENDING  Incomplete  Urine culture     Status: None   Collection Time: 12/29/14  5:00 PM  Result Value Ref Range Status   Specimen Description URINE, CLEAN CATCH  Final   Special Requests NONE  Final   Culture   Final    NO GROWTH 1 DAY Performed at [facility]    Report Status 12/31/2014 FINAL  Final  Culture, sputum-assessment     Status: None   Collection Time: 12/29/14 11:15 PM  Result Value Ref Range Status   Specimen Description SPU  Final   Special Requests Immunocompromised  Final   Sputum evaluation THIS SPECIMEN IS ACCEPTABLE FOR SPUTUM CULTURE  Final   Report Status 12/29/2014 FINAL  Final  Culture, respiratory (NON-Expectorated)     Status: None (Preliminary result)   Collection Time: 12/29/14 11:45 PM  Result Value Ref Range Status   Specimen Description SPUTUM  Final   Special Requests NONE  Final   Gram Stain   Final    ABUNDANT WBC PRESENT, PREDOMINANTLY PMN RARE  SQUAMOUS EPITHELIAL CELLS PRESENT MODERATE GRAM POSITIVE COCCI IN PAIRS FEW GRAM NEGATIVE RODS FEW GRAM NEGATIVE COCCI    Culture   Final    Culture reincubated for better growth Performed at [facility]    Report Status PENDING  Incomplete     Studies: Dg Chest 2 View  12/29/2014   CLINICAL DATA:  Constant right chest pain for 3-4 months. Coughing up blood. History of fevers. History of lung cancer. Last chemotherapy 12/03/2014.  EXAM: CHEST  2 VIEW  COMPARISON:  09/01/2014; 08/13/2014; chest CT- 12/21/2014  FINDINGS: Grossly unchanged cardiac silhouette and mediastinal contours. The amount of air and fluid within the cavitary right upper lobe mass appears grossly unchanged when compared to chest CT performed 12/21/2014 with persistent irregular rind like opacification. There is persistent right-sided volume loss with slight worsening of right medial basilar heterogeneous opacities, likely atelectasis. There is grossly unchanged mild diffuse nodular thickening of the pulmonary interstitium as better demonstrated on preceding chest CT. No definite pleural effusion or evidence of edema. Unchanged bones.  IMPRESSION: No definitive interval change since chest CT performed 12/21/2014. Specifically, no definitive change in known cavitary air and fluid containing mass within the right upper lobe.   Electronically Signed   By: Sandi Mariscal M.D.   On: 12/29/2014 15:05    Scheduled Meds:  Scheduled Meds: . azithromycin  500 mg Intravenous Q24H  . benzonatate  100 mg Oral TID  . chlorpheniramine-HYDROcodone  5 mL Oral Q12H  . docusate sodium  100 mg Oral BID  . OxyCODONE  40 mg Oral Q12H  . piperacillin-tazobactam (ZOSYN)  IV  3.375 g Intravenous 3 times per day  . vancomycin  750 mg Intravenous Q12H   Continuous Infusions: . sodium chloride 20 mL/hr at 12/29/14 1921    Time spent on care of this patient: 58 min   [place], MD 12/31/2014, 11:34 AM  LOS: 2 days   [facility] Office  [phone] Pager - Text Page per [email or web address] If 7PM-7AM, please contact night-coverage [email or web address]

## 2014-12-31 NOTE — Care Management Note (Signed)
Case Management Note  Patient Details  Name: Barbara Frost MRN: [id number] Date of Birth: [birth year]  Subjective/Objective:                 44 yo admitted with CAP, hx of lung CA.   Action/Plan: From home with parent  Expected Discharge Date:                  Expected Discharge Plan:  Home/Self Care  In-House Referral:     Discharge planning Services  CM Consult  Post Acute Care Choice:    Choice offered to:     DME Arranged:    DME Agency:     HH Arranged:    HH Agency:     Status of Service:  In process, will continue to follow  Medicare Important Message Given:    Date Medicare IM Given:    Medicare IM give by:    Date Additional Medicare IM Given:    Additional Medicare Important Message give by:     If discussed at [place] of Stay Meetings, dates discussed:    Additional Comments: Chart reviewed and CM following for DC needs. Lynnell Catalan, RN 12/31/2014, 3:34 PM

## 2015-01-01 DIAGNOSIS — J852 Abscess of lung without pneumonia: Secondary | ICD-10-CM | POA: Insufficient documentation

## 2015-01-01 DIAGNOSIS — R042 Hemoptysis: Secondary | ICD-10-CM

## 2015-01-01 DIAGNOSIS — D509 Iron deficiency anemia, unspecified: Secondary | ICD-10-CM

## 2015-01-01 DIAGNOSIS — C3490 Malignant neoplasm of unspecified part of unspecified bronchus or lung: Secondary | ICD-10-CM

## 2015-01-01 LAB — CBC
HCT: 30.3 % — ABNORMAL LOW (ref 36.0–46.0)
Hemoglobin: 8.9 g/dL — ABNORMAL LOW (ref 12.0–15.0)
MCH: 26.1 pg (ref 26.0–34.0)
MCHC: 29.4 g/dL — ABNORMAL LOW (ref 30.0–36.0)
MCV: 88.9 fL (ref 78.0–100.0)
PLATELETS: 474 10*3/uL — AB (ref 150–400)
RBC: 3.41 MIL/uL — AB (ref 3.87–5.11)
RDW: 18.7 % — AB (ref 11.5–15.5)
WBC: 15 10*3/uL — AB (ref 4.0–10.5)

## 2015-01-01 LAB — CULTURE, RESPIRATORY W GRAM STAIN: Culture: NORMAL

## 2015-01-01 LAB — TYPE AND SCREEN
ABO/RH(D): B POS
Antibody Screen: NEGATIVE
Unit division: 0

## 2015-01-01 LAB — CULTURE, RESPIRATORY

## 2015-01-01 MED ORDER — OXYCODONE HCL ER 40 MG PO T12A
60.0000 mg | EXTENDED_RELEASE_TABLET | Freq: Two times a day (BID) | ORAL | Status: DC
  Administered 2015-01-01 – 2015-01-03 (×4): 60 mg via ORAL
  Filled 2015-01-01 (×8): qty 1

## 2015-01-01 MED ORDER — OXYCODONE HCL ER 20 MG PO T12A: 20.0000 mg | EXTENDED_RELEASE_TABLET | Freq: Once | ORAL | Status: DC

## 2015-01-01 MED ORDER — OXYCODONE HCL ER 20 MG PO T12A
20.0000 mg | EXTENDED_RELEASE_TABLET | Freq: Once | ORAL | Status: AC
  Administered 2015-01-01: 20 mg via ORAL
  Filled 2015-01-01: qty 1

## 2015-01-01 MED ORDER — FERROUS SULFATE 325 (65 FE) MG PO TABS
325.0000 mg | ORAL_TABLET | Freq: Two times a day (BID) | ORAL | Status: DC
Start: 2015-01-01 — End: 2015-01-04
  Administered 2015-01-01 – 2015-01-04 (×6): 325 mg via ORAL
  Filled 2015-01-01 (×6): qty 1

## 2015-01-01 NOTE — Progress Notes (Addendum)
[facility] Progress Note   Barbara Frost  QIO:[id number]  DOB: 1971-03-14  DOA: 12/29/2014 PCP: [name], MD  Brief narrative: Barbara Frost is a 44 y.o. female  with "at least" a stage IIIa (T3, N2, M0) non-small cell lung cancer, squamous cell carcinoma presented with large right upper lobe mass with questionable mediastinal invasion diagnosed in February 2016 per Dr. Worthy Flank notes. Admitted for severe cough with bloody/yellow sputum and fevers of 101-104 despite being on Augmentin. Recent CT scan as outpatient revealed multifocal infiltrates and a air fluid level in RUL necrotic mass.   Subjective: Still having quite a lot of right-sided chest pain and requiring morphine every 4 hours even at night in addition to oxycodone and OxyContin. Continues to cough up large amounts of mucus. No shortness of breath.  Assessment/Plan: Principal Problem: Pneumonia-  fevers, leukocytosis, gross hemoptysis -Continues to be mildly tachycardic likely because of pain-not hypotensive- large amounts of yellow sputum -is bloody  -Temperature down to 99 - 100 -CT performed on 8/27 revealed multifocal infiltrates lung,  cavitation of a large right apical mass with air-fluid level which is likely the main source of infection -Failed Augmentin treatment-is likely immune compromised from recent chemotherapy and therefore, she is been placed on vancomycin, Zosyn,  Azithromycin- per pulmonary, DC vancomycin-we can switch to Levaquin and Flagyl (rather than clindamycin due to risk of C. difficile) on discharge - urinary strep neg -  Legionella pending - obtained sputum culture -Tussionex and Tessalon Perles for cough have been working well - Will follow-up with pulmonary-they suspect she will need 2-4 weeks of antibiotics -Do not resume aspirin on discharge-continue to hold until infection resolves  Gr pos rods in 1 set of blood cultures -Bacillus species- likely contaminant-  Right-sided chest  pain -in Relation to cancer and possibly also pleuritic -Increase OxyContin from 40 twice a day to 60 twice a day today -cont oxycodone (no percocet so that fevers are not masked) at a dose of 10 mg every 4 when necessary --Continue Colace to prevent constipation -when necessary morphine for severe breakthrough pain- hopefully we can get her pain controlled to a point where she does not need IV morphine and then can be discharged home   Non-small cell carcinoma of lung, stage 3 -Follows with Dr. Earlie Server - She has had radiation and has recently completed chemotherapy -Currently disease is "stable" and she will have a repeat CT in 3 months  Anemia-likely secondary to acute illness-underlying iron deficiency -Likely secondary to acute illness-transfused 1 unit packed red blood cells bringing hemoglobin from 7.6-8.9 -anemia panel shows low iron levels although ferritin is high (acute phase reactant)-we'll start twice a day ferrous sulfate-monitor for constipation     Code Status:     Code Status Orders        Start     Ordered   12/29/14 1713  Full code   Continuous     12/29/14 1713     Family Communication: mother Disposition Plan: home when pain is well controlled- discharge medications should be as follows: Levaquin, Flagyl, Tussionex, Tessalon Perles, OxyContin, oxycodone, Colace, ferrous sulfate, when necessary senna or MiraLAX DVT prophylaxis: SCDS Consultants: Pulm Procedures: Appt with PCP: requested Antibiotics: Anti-infectives    Start     Dose/Rate Route Frequency Ordered Stop   12/30/14 0400  vancomycin (VANCOCIN) IVPB 750 mg/150 ml premix  Status:  Discontinued     750 mg 150 mL/hr over 60 Minutes Intravenous Every 12 hours 12/29/14 1809 01/01/15 1044  12/29/14 2200  piperacillin-tazobactam (ZOSYN) IVPB 3.375 g     3.375 g 12.5 mL/hr over 240 Minutes Intravenous 3 times per day 12/29/14 1807     12/29/14 1715  piperacillin-tazobactam (ZOSYN) IVPB 3.375 g   Status:  Discontinued     3.375 g 100 mL/hr over 30 Minutes Intravenous 3 times per day 12/29/14 1713 12/29/14 1807   12/29/14 1715  azithromycin (ZITHROMAX) 500 mg in dextrose 5 % 250 mL IVPB     500 mg 250 mL/hr over 60 Minutes Intravenous Every 24 hours 12/29/14 1713     12/29/14 1530  piperacillin-tazobactam (ZOSYN) IVPB 3.375 g     3.375 g 100 mL/hr over 30 Minutes Intravenous  Once 12/29/14 1518 12/29/14 1658   12/29/14 1515  vancomycin (VANCOCIN) IVPB 1000 mg/200 mL premix     1,000 mg 200 mL/hr over 60 Minutes Intravenous  Once 12/29/14 1514 12/29/14 1658   12/29/14 1515  piperacillin-tazobactam (ZOSYN) IVPB 4.5 g  Status:  Discontinued     4.5 g 200 mL/hr over 30 Minutes Intravenous  Once 12/29/14 1514 12/29/14 1517      Objective: Filed Weights   12/29/14 1805  Weight: 56.019 kg (123 lb 8 oz)    Intake/Output Summary (Last 24 hours) at 01/01/15 1044 Last data filed at 01/01/15 0500  Gross per 24 hour  Intake 1173.25 ml  Output   1000 ml  Net 173.25 ml     Vitals Filed Vitals:   12/31/14 1316 12/31/14 1501 12/31/14 2052 01/01/15 0646  BP: 109/69 116/76 114/79 105/73  Pulse: 103 94 116 80  Temp: 98.7 F (37.1 C) 99 F (37.2 C) 100.3 F (37.9 C) 98.9 F (37.2 C)  TempSrc: Oral Oral Oral Oral  Resp: '18 18 20 18  '$ Height:      Weight:      SpO2: 92% 92% 92% 91%    Exam:  General:  Pt is alert, not in acute distress  HEENT: No icterus, No thrush, oral mucosa moist  Cardiovascular: regular rate and rhythm, S1/S2 No murmur  Respiratory: clear to auscultation bilaterally   Abdomen: Soft, +Bowel sounds, non tender, non distended, no guarding  MSK: No LE edema, cyanosis or clubbing  Data Reviewed: Basic Metabolic Panel:  Recent Labs Lab 12/29/14 1535 12/30/14 0819 12/31/14 0421  NA 136 136 136  K 4.0 4.3 4.2  CL 102 101 98*  CO2 '25 29 28  '$ GLUCOSE 101* 112* 103*  BUN 9 <5* 7  CREATININE 0.53 0.55 0.63  CALCIUM 8.5* 8.6* 8.7*   Liver  Function Tests:  Recent Labs Lab 12/29/14 1535  AST 15  ALT 10*  ALKPHOS 82  BILITOT 0.5  PROT 7.4  ALBUMIN 2.4*   No results for input(s): LIPASE, AMYLASE in the last 168 hours. No results for input(s): AMMONIA in the last 168 hours. CBC:  Recent Labs Lab 12/29/14 1535 12/30/14 0819 12/31/14 0421 12/31/14 1825 01/01/15 0420  WBC 22.1* 21.4* 18.3*  --  15.0*  NEUTROABS 18.6*  --   --   --   --   HGB 7.3* 7.7* 7.6* 9.4* 8.9*  HCT 24.7* 26.6* 26.4* 30.9* 30.3*  MCV 91.1 89.6 89.5  --  88.9  PLT 445* 498* 498*  --  474*   Cardiac Enzymes: No results for input(s): CKTOTAL, CKMB, CKMBINDEX, TROPONINI in the last 168 hours. BNP (last 3 results) No results for input(s): BNP in the last 8760 hours.  ProBNP (last 3 results) No results for input(s): PROBNP  in the last 8760 hours.  CBG: No results for input(s): GLUCAP in the last 168 hours.  Recent Results (from the past 240 hour(s))  TECHNOLOGIST REVIEW     Status: None   Collection Time: 12/24/14  8:36 AM  Result Value Ref Range Status   Technologist Review Rare metamyelocyte present  Final  Blood culture (routine x 2)     Status: None (Preliminary result)   Collection Time: 12/29/14  3:25 PM  Result Value Ref Range Status   Specimen Description BLOOD RIGHT ANTECUBITAL  Final   Special Requests BOTTLES DRAWN AEROBIC AND ANAEROBIC 5CC  Final   Culture   Final    NO GROWTH 2 DAYS Performed at [facility]    Report Status PENDING  Incomplete  Blood culture (routine x 2)     Status: None (Preliminary result)   Collection Time: 12/29/14  3:33 PM  Result Value Ref Range Status   Specimen Description BLOOD RIGHT HAND  Final   Special Requests BOTTLES DRAWN AEROBIC AND ANAEROBIC 3CC  Final   Culture  Setup Time   Final    GRAM POSITIVE RODS AEROBIC BOTTLE ONLY CRITICAL RESULT CALLED TO, READ BACK BY AND VERIFIED WITH: Kristie Cowman [id number] [place]    Culture   Final    BACILLUS SPECIES Standardized  susceptibility testing for this organism is not available. Performed at [facility]    Report Status PENDING  Incomplete  Urine culture     Status: None   Collection Time: 12/29/14  5:00 PM  Result Value Ref Range Status   Specimen Description URINE, CLEAN CATCH  Final   Special Requests NONE  Final   Culture   Final    NO GROWTH 1 DAY Performed at [facility]    Report Status 12/31/2014 FINAL  Final  Culture, sputum-assessment     Status: None   Collection Time: 12/29/14 11:15 PM  Result Value Ref Range Status   Specimen Description SPU  Final   Special Requests Immunocompromised  Final   Sputum evaluation THIS SPECIMEN IS ACCEPTABLE FOR SPUTUM CULTURE  Final   Report Status 12/29/2014 FINAL  Final  Culture, respiratory (NON-Expectorated)     Status: None   Collection Time: 12/29/14 11:45 PM  Result Value Ref Range Status   Specimen Description SPUTUM  Final   Special Requests NONE  Final   Gram Stain   Final    ABUNDANT WBC PRESENT, PREDOMINANTLY PMN RARE SQUAMOUS EPITHELIAL CELLS PRESENT MODERATE GRAM POSITIVE COCCI IN PAIRS FEW GRAM NEGATIVE RODS FEW GRAM NEGATIVE COCCI    Culture   Final    NORMAL OROPHARYNGEAL FLORA Performed at [facility]    Report Status 01/01/2015 FINAL  Final     Studies: No results found.  Scheduled Meds:  Scheduled Meds: . azithromycin  500 mg Intravenous Q24H  . benzonatate  100 mg Oral TID  . chlorpheniramine-HYDROcodone  5 mL Oral Q12H  . docusate sodium  100 mg Oral BID  . ferrous sulfate  325 mg Oral BID WC  . OxyCODONE  20 mg Oral Once  . OxyCODONE  20 mg Oral Once  . OxyCODONE  60 mg Oral Q12H  . piperacillin-tazobactam (ZOSYN)  IV  3.375 g Intravenous 3 times per day   Continuous Infusions: . sodium chloride 20 mL/hr at 12/29/14 1921    Time spent on care of this patient: 71 min   [place], MD 01/01/2015, 10:44 AM  LOS: 3 days  [facility] Office  [phone] Pager - Text  Page per [email or web address] If 7PM-7AM, please contact night-coverage [email or web address]

## 2015-01-01 NOTE — Progress Notes (Signed)
[place] for renally adjust abx - zosyn Indication: pneumonia  Allergies  Allergen Reactions  . Keflex [Cephalexin] Rash    Patient Measurements: Height: 5' (152.4 cm) Weight: 123 lb 8 oz (56.019 kg) IBW/kg (Calculated) : 45.5  Vital Signs: Temp: 98.9 F (37.2 C) (08/30 0646) Temp Source: Oral (08/30 0646) BP: 105/73 mmHg (08/30 0646) Pulse Rate: 80 (08/30 0646) Intake/Output from previous day: 08/29 0701 - 08/30 0700 In: 1533.3 [P.O.:1010; I.V.:210; Blood:313.3] Out: 1000 [Urine:1000] Intake/Output from this shift:    Labs:  Recent Labs  12/29/14 1535 12/30/14 0819 12/31/14 0421 12/31/14 1825 01/01/15 0420  WBC 22.1* 21.4* 18.3*  --  15.0*  HGB 7.3* 7.7* 7.6* 9.4* 8.9*  PLT 445* 498* 498*  --  474*  CREATININE 0.53 0.55 0.63  --   --    Estimated Creatinine Clearance: 69.7 mL/min (by C-G formula based on Cr of 0.63). No results for input(s): VANCOTROUGH, VANCOPEAK, VANCORANDOM, GENTTROUGH, GENTPEAK, GENTRANDOM, TOBRATROUGH, TOBRAPEAK, TOBRARND, AMIKACINPEAK, AMIKACINTROU, AMIKACIN in the last 72 hours.   Microbiology: Recent Results (from the past 720 hour(s))  TECHNOLOGIST REVIEW     Status: None   Collection Time: 12/03/14  8:57 AM  Result Value Ref Range Status   Technologist Review Occassional Metas and Myelocytes present  Final  TECHNOLOGIST REVIEW     Status: None   Collection Time: 12/24/14  8:36 AM  Result Value Ref Range Status   Technologist Review Rare metamyelocyte present  Final  Blood culture (routine x 2)     Status: None (Preliminary result)   Collection Time: 12/29/14  3:25 PM  Result Value Ref Range Status   Specimen Description BLOOD RIGHT ANTECUBITAL  Final   Special Requests BOTTLES DRAWN AEROBIC AND ANAEROBIC 5CC  Final   Culture   Final    NO GROWTH 2 DAYS Performed at [facility]    Report Status PENDING  Incomplete  Blood culture (routine x 2)     Status: None (Preliminary result)   Collection Time: 12/29/14  3:33 PM  Result Value Ref Range Status   Specimen Description BLOOD RIGHT HAND  Final   Special Requests BOTTLES DRAWN AEROBIC AND ANAEROBIC 3CC  Final   Culture  Setup Time   Final    GRAM POSITIVE RODS AEROBIC BOTTLE ONLY CRITICAL RESULT CALLED TO, READ BACK BY AND VERIFIED WITH: Kristie Cowman [id number] [place]    Culture   Final    BACILLUS SPECIES Standardized susceptibility testing for this organism is not available. Performed at [facility]    Report Status PENDING  Incomplete  Urine culture     Status: None   Collection Time: 12/29/14  5:00 PM  Result Value Ref Range Status   Specimen Description URINE, CLEAN CATCH  Final   Special Requests NONE  Final   Culture   Final    NO GROWTH 1 DAY Performed at [facility]    Report Status 12/31/2014 FINAL  Final  Culture, sputum-assessment     Status: None   Collection Time: 12/29/14 11:15 PM  Result Value Ref Range Status   Specimen Description SPU  Final   Special Requests Immunocompromised  Final   Sputum evaluation THIS SPECIMEN IS ACCEPTABLE FOR SPUTUM CULTURE  Final   Report Status 12/29/2014 FINAL  Final  Culture, respiratory (NON-Expectorated)     Status: None (Preliminary result)   Collection Time: 12/29/14 11:45 PM  Result Value Ref Range Status   Specimen Description  SPUTUM  Final   Special Requests NONE  Final   Gram Stain   Final    ABUNDANT WBC PRESENT, PREDOMINANTLY PMN RARE SQUAMOUS EPITHELIAL CELLS PRESENT MODERATE GRAM POSITIVE COCCI IN PAIRS FEW GRAM NEGATIVE RODS FEW GRAM NEGATIVE COCCI    Culture   Final    Culture reincubated for better growth Performed at [facility]    Report Status PENDING  Incomplete     Medications:  Anti-infectives    Start     Dose/Rate Route Frequency Ordered Stop   12/30/14 0400  vancomycin (VANCOCIN) IVPB 750 mg/150 ml premix     750 mg 150 mL/hr over 60 Minutes Intravenous Every 12 hours 12/29/14 1809      12/29/14 2200  piperacillin-tazobactam (ZOSYN) IVPB 3.375 g     3.375 g 12.5 mL/hr over 240 Minutes Intravenous 3 times per day 12/29/14 1807     12/29/14 1715  piperacillin-tazobactam (ZOSYN) IVPB 3.375 g  Status:  Discontinued     3.375 g 100 mL/hr over 30 Minutes Intravenous 3 times per day 12/29/14 1713 12/29/14 1807   12/29/14 1715  azithromycin (ZITHROMAX) 500 mg in dextrose 5 % 250 mL IVPB     500 mg 250 mL/hr over 60 Minutes Intravenous Every 24 hours 12/29/14 1713     12/29/14 1530  piperacillin-tazobactam (ZOSYN) IVPB 3.375 g     3.375 g 100 mL/hr over 30 Minutes Intravenous  Once 12/29/14 1518 12/29/14 1658   12/29/14 1515  vancomycin (VANCOCIN) IVPB 1000 mg/200 mL premix     1,000 mg 200 mL/hr over 60 Minutes Intravenous  Once 12/29/14 1514 12/29/14 1658   12/29/14 1515  piperacillin-tazobactam (ZOSYN) IVPB 4.5 g  Status:  Discontinued     4.5 g 200 mL/hr over 30 Minutes Intravenous  Once 12/29/14 1514 12/29/14 1517     Assessment: 44 y.o. female with stage 3+ NSCLC with large RUL mass Dx 06/2014 admitted 12/29/2014 for pneumonia.  Recent cough/fevers have worsened over past week despite treatment with Augmentin.  Coughing up yellow mucus and blood; reports R sided pleuritic pain.  Pharmacy to dose vancomycin and renally adjust antibiotics for pneumonia.  First doses given in ED.  8/27 >> vancomycin >> 8/30 8/27 >> Zosyn (MD) >>   8/27 >> azithromycin (MD) >>    Temp: 100.3 WBC:  Elevated but improving Renal: SCr wnl LA wnl  8/27 blood: bacillus spp 1/2 8/27 sputum: Normal flora 8/27 Urine: NG S. pneumo UAg: negative Legionella UAg: negative  Dose changes/levels:  Goal of Therapy:  Vancomycin trough level 15-20 mcg/ml  Eradication of infection Appropriate antibiotic dosing for indication and renal function  Plan:  Day #3 antibiotics Stop vancomycin today Continue Zosyn extended-infusion dosing; azithromycin dosing is appropriate  - No dose adjustment needed  at this time  Follow clinical course, renal function, culture results as available  Follow for de-escalation of antibiotics and LOT  Doreene Eland, PharmD, BCPS.   Pager: [phone] 01/01/2015, 7:27 AM

## 2015-01-01 NOTE — Progress Notes (Signed)
   Name: Barbara Frost MRN: [id number] DOB: 12-17-1970    ADMISSION DATE:  12/29/2014 CONSULTATION DATE:  01/01/2015  REFERRING MD :  [facility]  CHIEF COMPLAINT:  Bloody sputum   HISTORY OF PRESENT ILLNESS:  44 year old smoker who presented with 10 cm dense right upper lobe mass in February 2016-this was diagnosed as squamous cell carcinoma with a CT-guided biopsy PET scan did not show mediastinal invasion-felt to be stage IIIa. She underwent concurrent chemoradiation-6 cycles of chemotherapy with last dose 08/20/14 and then 3 cycles of consolidation chemotherapy.  Review of serial imaging scans show cavitation of the right upper lobe lesion. She complains of a chronic cough productive of clear sputum over the past few months-over the past few days this sputum has turned yellow to green, and finally bloody over the last 2 days She also reports right-sided chest pain nonradiating-she was started by her PCP on Augmentin, she was also maintained on MS Contin by her oncologist for chest pain. She reports a fever for 2 days prior to admission, MAXIMUM TEMPERATURE was 100 in the hospital   SIGNIFICANT EVENTS    STUDIES:  CT chest 12/21/14 - large right upper lobe cavity with thick walls-unchanged from prior . The consolidation in superior segment of the right lower lobe, with multiple nodular airspace disease throughout the lungs  Blood cx 8/27 >> bacillusSUBJECTIVE:   SUBJ - afebrile  C/o chest pain Cough with yellow sputum - no hemoptysis   VITAL SIGNS: Temp:  [98.7 F (37.1 C)-100.3 F (37.9 C)] 98.9 F (37.2 C) (08/30 0646) Pulse Rate:  [80-116] 80 (08/30 0646) Resp:  [18-20] 18 (08/30 0646) BP: (105-116)/(69-79) 105/73 mmHg (08/30 0646) SpO2:  [91 %-92 %] 91 % (08/30 0646)  PHYSICAL EXAMINATION: Gen. Pleasant, well-nourished, in no distress, normal affect ENT - no lesions, no post nasal drip Neck: No JVD, no thyromegaly, no carotid bruits Lungs: no use of accessory muscles, no  dullness to percussion, clear without rales or rhonchi , increased vocal resonance right suprascapular Cardiovascular: Rhythm regular, heart sounds  normal, no murmurs, no peripheral edema Abdomen: soft and non-tender, no hepatosplenomegaly, BS normal. Musculoskeletal: No deformities, no cyanosis or clubbing Neuro:  alert, non focal Skin:  Warm, no lesions/ rash     Recent Labs Lab 12/29/14 1535 12/30/14 0819 12/31/14 0421  NA 136 136 136  K 4.0 4.3 4.2  CL 102 101 98*  CO2 '25 29 28  '$ BUN 9 <5* 7  CREATININE 0.53 0.55 0.63  GLUCOSE 101* 112* 103*    Recent Labs Lab 12/30/14 0819 12/31/14 0421 12/31/14 1825 01/01/15 0420  HGB 7.7* 7.6* 9.4* 8.9*  HCT 26.6* 26.4* 30.9* 30.3*  WBC 21.4* 18.3*  --  15.0*  PLT 498* 498*  --  474*   No results found.  ASSESSMENT / PLAN:  Right upper lobe cavitary lesion- related to necrosis of tumor post chemoradiation We'll treat for superinfection as lung abscess -Bacillus 1/2 in blood culture could be contaminant  Recommend- agree with IV antibiotics  Until WBC normalised & symptoms improved , would then change to oral antibiotics for a longer period 2-4 weeks depending on clinical improvement -Can use oral Levaquin and flagyl -  - outpatient follow-up arranged  PCCM to sign off    Kara Mead MD. [place] Flood. [facility] Pager [phone] If no response call [phone]   01/01/2015, 12:57 PM

## 2015-01-02 DIAGNOSIS — J189 Pneumonia, unspecified organism: Secondary | ICD-10-CM

## 2015-01-02 LAB — CBC
HCT: 29.7 % — ABNORMAL LOW (ref 36.0–46.0)
Hemoglobin: 9.1 g/dL — ABNORMAL LOW (ref 12.0–15.0)
MCH: 27.4 pg (ref 26.0–34.0)
MCHC: 30.6 g/dL (ref 30.0–36.0)
MCV: 89.5 fL (ref 78.0–100.0)
Platelets: 491 10*3/uL — ABNORMAL HIGH (ref 150–400)
RBC: 3.32 MIL/uL — ABNORMAL LOW (ref 3.87–5.11)
RDW: 18.9 % — ABNORMAL HIGH (ref 11.5–15.5)
WBC: 14 10*3/uL — ABNORMAL HIGH (ref 4.0–10.5)

## 2015-01-02 MED ORDER — HYDROCODONE-HOMATROPINE 5-1.5 MG/5ML PO SYRP
5.0000 mL | ORAL_SOLUTION | ORAL | Status: DC | PRN
  Administered 2015-01-02 – 2015-01-04 (×12): 5 mL via ORAL
  Filled 2015-01-02 (×12): qty 5

## 2015-01-02 NOTE — Progress Notes (Signed)
OT Cancellation Note  Patient Details Name: Barbara Frost MRN: [id number] DOB: March 26, 1971   Cancelled Treatment:    Reason Eval/Treat Not Completed: PT screened, no needs identified, will sign off  Ayva Veilleux 01/02/2015, 3:11 PM  Lesle Chris, OTR/L [phone] 01/02/2015

## 2015-01-02 NOTE — Progress Notes (Signed)
PT Cancellation Note  Patient Details Name: Barbara Frost MRN: [id number] DOB: 1971/04/07   Cancelled Treatment:    Reason Eval/Treat Not Completed: PT screened, no needs identified, will sign off (walking with mother, no PT needs.)   Claretha Cooper 01/02/2015, 3:07 PM Tresa Endo PT [phone]

## 2015-01-02 NOTE — Progress Notes (Signed)
[facility] Progress Note   Barbara Frost  ZJQ:[id number]  DOB: 12-09-1970  DOA: 12/29/2014 PCP: [name], MD  Brief narrative: Barbara Frost is a 44 y.o. female  with "at least" a stage IIIa (T3, N2, M0) non-small cell lung cancer, squamous cell carcinoma presented with large right upper lobe mass with questionable mediastinal invasion diagnosed in February 2016 per Dr. Worthy Flank notes. Admitted for severe cough with bloody/yellow sputum and fevers of 101-104 despite being on Augmentin. Recent CT scan as outpatient revealed multifocal infiltrates and a air fluid level in RUL necrotic mass.   Subjective: No hemoptysis but still continues to have a lot of cough and chest pain when she coughs  Assessment/Plan: Principal Problem: Pneumonia-  fevers, leukocytosis, gross hemoptysis -Continues to be mildly tachycardic likely because of pain-not hypotensive- large amounts of yellow sputum -is bloody  -Temperature down to 99 - 100 -CT performed on 8/27 revealed multifocal infiltrates lung,  cavitation of a large right apical mass with air-fluid level which is likely the main source of infection -Failed Augmentin treatment-is likely immune compromised from recent chemotherapy and therefore, she is been placed on vancomycin, Zosyn,  Azithromycin- per pulmonary, DC vancomycin-we can switch to Levaquin and Flagyl (rather than clindamycin due to risk of C. difficile) on discharge - urinary strep neg -  Legionella negative Hycodan for cough Until WBC normalised & symptoms improved , would then change to oral antibiotics for a longer period 2-4 weeks depending on clinical improvement -Can use oral Levaquin and flagyl   Gr pos rods in 1 set of blood cultures -Bacillus species- likely contaminant-   Right-sided chest pain -in Relation to cancer and possibly also pleuritic -Increase OxyContin to 60 twice a day today -cont oxycodone (no percocet so that fevers are not masked) at a dose of 10  mg every 4 when necessary --Continue Colace to prevent constipation -when necessary morphine for severe breakthrough pain- hopefully we can get her pain controlled to a point where she does not need IV morphine and then can be discharged home    Non-small cell carcinoma of lung, stage 3 -Follows with Dr. Earlie Server - She has had radiation and has recently completed chemotherapy -Currently disease is "stable" and she will have a repeat CT in 3 months   Anemia-likely secondary to acute illness-underlying iron deficiency -Likely secondary to acute illness-transfused 1 unit packed red blood cells bringing hemoglobin from 7.6-8.9 -anemia panel shows low iron levels although ferritin is high (acute phase reactant)-we'll start twice a day ferrous sulfate-monitor for constipation     Code Status:     Code Status Orders        Start     Ordered   12/29/14 1713  Full code   Continuous     12/29/14 1713     Family Communication: mother Disposition Plan: home when pain is well controlled- discharge medications should be as follows: Anticipate discharge home tomorrow   DVT prophylaxis: SCDS Consultants: Pulm Procedures: Appt with PCP: requested Antibiotics: Anti-infectives    Start     Dose/Rate Route Frequency Ordered Stop   12/30/14 0400  vancomycin (VANCOCIN) IVPB 750 mg/150 ml premix  Status:  Discontinued     750 mg 150 mL/hr over 60 Minutes Intravenous Every 12 hours 12/29/14 1809 01/01/15 1044   12/29/14 2200  piperacillin-tazobactam (ZOSYN) IVPB 3.375 g     3.375 g 12.5 mL/hr over 240 Minutes Intravenous 3 times per day 12/29/14 1807     12/29/14 1715  piperacillin-tazobactam (ZOSYN)  IVPB 3.375 g  Status:  Discontinued     3.375 g 100 mL/hr over 30 Minutes Intravenous 3 times per day 12/29/14 1713 12/29/14 1807   12/29/14 1715  azithromycin (ZITHROMAX) 500 mg in dextrose 5 % 250 mL IVPB     500 mg 250 mL/hr over 60 Minutes Intravenous Every 24 hours 12/29/14 1713      12/29/14 1530  piperacillin-tazobactam (ZOSYN) IVPB 3.375 g     3.375 g 100 mL/hr over 30 Minutes Intravenous  Once 12/29/14 1518 12/29/14 1658   12/29/14 1515  vancomycin (VANCOCIN) IVPB 1000 mg/200 mL premix     1,000 mg 200 mL/hr over 60 Minutes Intravenous  Once 12/29/14 1514 12/29/14 1658   12/29/14 1515  piperacillin-tazobactam (ZOSYN) IVPB 4.5 g  Status:  Discontinued     4.5 g 200 mL/hr over 30 Minutes Intravenous  Once 12/29/14 1514 12/29/14 1517      Objective: Filed Weights   12/29/14 1805  Weight: 56.019 kg (123 lb 8 oz)    Intake/Output Summary (Last 24 hours) at 01/02/15 1001 Last data filed at 01/02/15 0612  Gross per 24 hour  Intake   1260 ml  Output      0 ml  Net   1260 ml     Vitals Filed Vitals:   01/01/15 1350 01/01/15 2053 01/01/15 2054 01/02/15 0402  BP: 100/62 105/73  121/70  Pulse: 93 112  119  Temp: 99.4 F (37.4 C) 99.6 F (37.6 C)  98.6 F (37 C)  TempSrc: Oral Oral  Oral  Resp: '20 20  18  '$ Height:      Weight:      SpO2: 90% 89% 97% 99%    Exam:  General:  Pt is alert, not in acute distress  HEENT: No icterus, No thrush, oral mucosa moist  Cardiovascular: regular rate and rhythm, S1/S2 No murmur  Respiratory: clear to auscultation bilaterally   Abdomen: Soft, +Bowel sounds, non tender, non distended, no guarding  MSK: No LE edema, cyanosis or clubbing  Data Reviewed: Basic Metabolic Panel:  Recent Labs Lab 12/29/14 1535 12/30/14 0819 12/31/14 0421  NA 136 136 136  K 4.0 4.3 4.2  CL 102 101 98*  CO2 '25 29 28  '$ GLUCOSE 101* 112* 103*  BUN 9 <5* 7  CREATININE 0.53 0.55 0.63  CALCIUM 8.5* 8.6* 8.7*   Liver Function Tests:  Recent Labs Lab 12/29/14 1535  AST 15  ALT 10*  ALKPHOS 82  BILITOT 0.5  PROT 7.4  ALBUMIN 2.4*   No results for input(s): LIPASE, AMYLASE in the last 168 hours. No results for input(s): AMMONIA in the last 168 hours. CBC:  Recent Labs Lab 12/29/14 1535 12/30/14 0819 12/31/14 0421  12/31/14 1825 01/01/15 0420 01/02/15 0355  WBC 22.1* 21.4* 18.3*  --  15.0* 14.0*  NEUTROABS 18.6*  --   --   --   --   --   HGB 7.3* 7.7* 7.6* 9.4* 8.9* 9.1*  HCT 24.7* 26.6* 26.4* 30.9* 30.3* 29.7*  MCV 91.1 89.6 89.5  --  88.9 89.5  PLT 445* 498* 498*  --  474* 491*   Cardiac Enzymes: No results for input(s): CKTOTAL, CKMB, CKMBINDEX, TROPONINI in the last 168 hours. BNP (last 3 results) No results for input(s): BNP in the last 8760 hours.  ProBNP (last 3 results) No results for input(s): PROBNP in the last 8760 hours.  CBG: No results for input(s): GLUCAP in the last 168 hours.  Recent Results (from  the past 240 hour(s))  TECHNOLOGIST REVIEW     Status: None   Collection Time: 12/24/14  8:36 AM  Result Value Ref Range Status   Technologist Review Rare metamyelocyte present  Final  Blood culture (routine x 2)     Status: None (Preliminary result)   Collection Time: 12/29/14  3:25 PM  Result Value Ref Range Status   Specimen Description BLOOD RIGHT ANTECUBITAL  Final   Special Requests BOTTLES DRAWN AEROBIC AND ANAEROBIC 5CC  Final   Culture   Final    NO GROWTH 3 DAYS Performed at [facility]    Report Status PENDING  Incomplete  Blood culture (routine x 2)     Status: None (Preliminary result)   Collection Time: 12/29/14  3:33 PM  Result Value Ref Range Status   Specimen Description BLOOD RIGHT HAND  Final   Special Requests BOTTLES DRAWN AEROBIC AND ANAEROBIC 3CC  Final   Culture  Setup Time   Final    GRAM POSITIVE RODS AEROBIC BOTTLE ONLY CRITICAL RESULT CALLED TO, READ BACK BY AND VERIFIED WITH: Kristie Cowman [id number] [place]    Culture   Final    BACILLUS SPECIES Standardized susceptibility testing for this organism is not available. Performed at [facility]    Report Status PENDING  Incomplete  Urine culture     Status: None   Collection Time: 12/29/14  5:00 PM  Result Value Ref Range Status   Specimen Description URINE, CLEAN CATCH   Final   Special Requests NONE  Final   Culture   Final    NO GROWTH 1 DAY Performed at [facility]    Report Status 12/31/2014 FINAL  Final  Culture, sputum-assessment     Status: None   Collection Time: 12/29/14 11:15 PM  Result Value Ref Range Status   Specimen Description SPU  Final   Special Requests Immunocompromised  Final   Sputum evaluation THIS SPECIMEN IS ACCEPTABLE FOR SPUTUM CULTURE  Final   Report Status 12/29/2014 FINAL  Final  Culture, respiratory (NON-Expectorated)     Status: None   Collection Time: 12/29/14 11:45 PM  Result Value Ref Range Status   Specimen Description SPUTUM  Final   Special Requests NONE  Final   Gram Stain   Final    ABUNDANT WBC PRESENT, PREDOMINANTLY PMN RARE SQUAMOUS EPITHELIAL CELLS PRESENT MODERATE GRAM POSITIVE COCCI IN PAIRS FEW GRAM NEGATIVE RODS FEW GRAM NEGATIVE COCCI    Culture   Final    NORMAL OROPHARYNGEAL FLORA Performed at [facility]    Report Status 01/01/2015 FINAL  Final     Studies: No results found.  Scheduled Meds:  Scheduled Meds: . azithromycin  500 mg Intravenous Q24H  . benzonatate  100 mg Oral TID  . chlorpheniramine-HYDROcodone  5 mL Oral Q12H  . docusate sodium  100 mg Oral BID  . ferrous sulfate  325 mg Oral BID WC  . OxyCODONE  60 mg Oral Q12H  . piperacillin-tazobactam (ZOSYN)  IV  3.375 g Intravenous 3 times per day   Continuous Infusions: . sodium chloride 20 mL/hr at 12/29/14 1921    Time spent on care of this patient: 20 min   Cashis Rill, MD 01/02/2015, 10:01 AM  LOS: 4 days   [facility] Office  [phone] Pager - Text Page per [email or web address] If 7PM-7AM, please contact night-coverage [email or web address]

## 2015-01-03 LAB — CBC
HCT: 30.3 % — ABNORMAL LOW (ref 36.0–46.0)
HEMOGLOBIN: 9.1 g/dL — AB (ref 12.0–15.0)
MCH: 27.1 pg (ref 26.0–34.0)
MCHC: 30 g/dL (ref 30.0–36.0)
MCV: 90.2 fL (ref 78.0–100.0)
PLATELETS: 443 10*3/uL — AB (ref 150–400)
RBC: 3.36 MIL/uL — AB (ref 3.87–5.11)
RDW: 18.9 % — ABNORMAL HIGH (ref 11.5–15.5)
WBC: 13.5 10*3/uL — ABNORMAL HIGH (ref 4.0–10.5)

## 2015-01-03 LAB — COMPREHENSIVE METABOLIC PANEL
ALK PHOS: 86 U/L (ref 38–126)
ALT: 15 U/L (ref 14–54)
ANION GAP: 7 (ref 5–15)
AST: 25 U/L (ref 15–41)
Albumin: 2.3 g/dL — ABNORMAL LOW (ref 3.5–5.0)
BILIRUBIN TOTAL: 0.6 mg/dL (ref 0.3–1.2)
BUN: 7 mg/dL (ref 6–20)
CALCIUM: 8.8 mg/dL — AB (ref 8.9–10.3)
CO2: 31 mmol/L (ref 22–32)
CREATININE: 0.65 mg/dL (ref 0.44–1.00)
Chloride: 95 mmol/L — ABNORMAL LOW (ref 101–111)
GFR calc non Af Amer: >60 mL/min (ref >60)
Glucose, Bld: 113 mg/dL — ABNORMAL HIGH (ref 65–99)
Potassium: 4.5 mmol/L (ref 3.5–5.1)
Sodium: 133 mmol/L — ABNORMAL LOW (ref 135–145)
TOTAL PROTEIN: 7.7 g/dL (ref 6.5–8.1)

## 2015-01-03 LAB — CULTURE, BLOOD (ROUTINE X 2): Culture: NO GROWTH

## 2015-01-03 MED ORDER — OXYCODONE HCL ER 40 MG PO T12A
75.0000 mg | EXTENDED_RELEASE_TABLET | Freq: Two times a day (BID) | ORAL | Status: DC
  Administered 2015-01-03 – 2015-01-04 (×2): 75 mg via ORAL
  Filled 2015-01-03 (×6): qty 1

## 2015-01-03 MED ORDER — LEVOFLOXACIN 750 MG PO TABS
750.0000 mg | ORAL_TABLET | Freq: Every day | ORAL | Status: DC
  Administered 2015-01-03 – 2015-01-04 (×2): 750 mg via ORAL
  Filled 2015-01-03 (×2): qty 1

## 2015-01-03 MED ORDER — METRONIDAZOLE 500 MG PO TABS
500.0000 mg | ORAL_TABLET | Freq: Three times a day (TID) | ORAL | Status: DC
  Administered 2015-01-03 – 2015-01-04 (×3): 500 mg via ORAL
  Filled 2015-01-03 (×3): qty 1

## 2015-01-03 NOTE — Progress Notes (Signed)
[facility] Progress Note   Barbara Frost  IOE:[id number]  DOB: January 15, 1971  DOA: 12/29/2014 PCP: [name], MD  Brief narrative: Barbara Frost is a 44 y.o. female  with "at least" a stage IIIa (T3, N2, M0) non-small cell lung cancer, squamous cell carcinoma presented with large right upper lobe mass with questionable mediastinal invasion diagnosed in February 2016 per Dr. Worthy Flank notes. Admitted for severe cough with bloody/yellow sputum and fevers of 101-104 despite being on Augmentin. Recent CT scan as outpatient revealed multifocal infiltrates and a air fluid level in RUL necrotic mass.   Subjective: Patient still complaining of a lot of chest pain and unable to sleep at night because of persistent coughing  Assessment/Plan: Principal Problem: Pneumonia-  fevers, leukocytosis, gross hemoptysis -Continues to be mildly tachycardic likely because of pain-not hypotensive- large amounts of yellow sputum -is bloody  -CT performed on 8/27 revealed multifocal infiltrates lung,  cavitation of a large right apical mass with air-fluid level which is likely the main source of infection -Failed Augmentin treatment-is likely immune compromised from recent chemotherapy and therefore, she was placed on vancomycin, Zosyn,  Azithromycin- per pulmonary, today we can switch to Levaquin and Flagyl for another 2 weeks - urinary strep neg -  Legionella negative Hycodan for cough Improving WBC with some symptomatic improvement , anticipate discharge tomorrow   Gr pos rods in 1 set of blood cultures -Bacillus species- likely contaminant-   Right-sided chest pain -in Relation to cancer and possibly also pleuritic -Increase OxyContin to 75 twice a day today -cont oxycodone (no percocet so that fevers are not masked) at a dose of 10 mg every 4 when necessary --Continue Colace to prevent constipation -when necessary morphine for severe breakthrough pain- hopefully we can get her pain controlled to  a point where she does not need IV morphine and then can be discharged home    Non-small cell carcinoma of lung, stage 3 -Follows with Dr. Earlie Server - She has had radiation and has recently completed chemotherapy -Currently disease is "stable" and she will have a repeat CT in 3 months   Anemia-likely secondary to acute illness-underlying iron deficiency -Likely secondary to acute illness-transfused 1 unit packed red blood cells bringing hemoglobin from 7.6-8.9 -anemia panel shows low iron levels although ferritin is high (acute phase reactant)-we'll start twice a day ferrous sulfate-monitor for constipation     Code Status:     Code Status Orders        Start     Ordered   12/29/14 1713  Full code   Continuous     12/29/14 1713     Family Communication: mother Disposition Plan: home when pain is well controlled- discharge medications should be as follows: Anticipate discharge home tomorrow   DVT prophylaxis: SCDS Consultants: Pulm Procedures: Appt with PCP: requested Antibiotics: Anti-infectives    Start     Dose/Rate Route Frequency Ordered Stop   12/30/14 0400  vancomycin (VANCOCIN) IVPB 750 mg/150 ml premix  Status:  Discontinued     750 mg 150 mL/hr over 60 Minutes Intravenous Every 12 hours 12/29/14 1809 01/01/15 1044   12/29/14 2200  piperacillin-tazobactam (ZOSYN) IVPB 3.375 g     3.375 g 12.5 mL/hr over 240 Minutes Intravenous 3 times per day 12/29/14 1807     12/29/14 1715  piperacillin-tazobactam (ZOSYN) IVPB 3.375 g  Status:  Discontinued     3.375 g 100 mL/hr over 30 Minutes Intravenous 3 times per day 12/29/14 1713 12/29/14 1807   12/29/14 1715  azithromycin (ZITHROMAX) 500 mg in dextrose 5 % 250 mL IVPB     500 mg 250 mL/hr over 60 Minutes Intravenous Every 24 hours 12/29/14 1713     12/29/14 1530  piperacillin-tazobactam (ZOSYN) IVPB 3.375 g     3.375 g 100 mL/hr over 30 Minutes Intravenous  Once 12/29/14 1518 12/29/14 1658   12/29/14 1515   vancomycin (VANCOCIN) IVPB 1000 mg/200 mL premix     1,000 mg 200 mL/hr over 60 Minutes Intravenous  Once 12/29/14 1514 12/29/14 1658   12/29/14 1515  piperacillin-tazobactam (ZOSYN) IVPB 4.5 g  Status:  Discontinued     4.5 g 200 mL/hr over 30 Minutes Intravenous  Once 12/29/14 1514 12/29/14 1517      Objective: Filed Weights   12/29/14 1805  Weight: 56.019 kg (123 lb 8 oz)    Intake/Output Summary (Last 24 hours) at 01/03/15 1228 Last data filed at 01/03/15 0856  Gross per 24 hour  Intake    960 ml  Output      0 ml  Net    960 ml     Vitals Filed Vitals:   01/02/15 0402 01/02/15 1422 01/02/15 2055 01/03/15 0507  BP: 121/70 1'12/86 97/66 96/64 '$  Pulse: 119 118 108 107  Temp: 98.6 F (37 C) 98.6 F (37 C) 98.6 F (37 C) 98.4 F (36.9 C)  TempSrc: Oral Oral Oral Oral  Resp: '18 16 16 16  '$ Height:      Weight:      SpO2: 99% 98% 99% 90%    Exam:  General:  Pt is alert, not in acute distress  HEENT: No icterus, No thrush, oral mucosa moist  Cardiovascular: regular rate and rhythm, S1/S2 No murmur  Respiratory: clear to auscultation bilaterally   Abdomen: Soft, +Bowel sounds, non tender, non distended, no guarding  MSK: No LE edema, cyanosis or clubbing  Data Reviewed: Basic Metabolic Panel:  Recent Labs Lab 12/29/14 1535 12/30/14 0819 12/31/14 0421 01/03/15 0410  NA 136 136 136 133*  K 4.0 4.3 4.2 4.5  CL 102 101 98* 95*  CO2 '25 29 28 31  '$ GLUCOSE 101* 112* 103* 113*  BUN 9 <5* 7 7  CREATININE 0.53 0.55 0.63 0.65  CALCIUM 8.5* 8.6* 8.7* 8.8*   Liver Function Tests:  Recent Labs Lab 12/29/14 1535 01/03/15 0410  AST 15 25  ALT 10* 15  ALKPHOS 82 86  BILITOT 0.5 0.6  PROT 7.4 7.7  ALBUMIN 2.4* 2.3*   No results for input(s): LIPASE, AMYLASE in the last 168 hours. No results for input(s): AMMONIA in the last 168 hours. CBC:  Recent Labs Lab 12/29/14 1535 12/30/14 0819 12/31/14 0421 12/31/14 1825 01/01/15 0420 01/02/15 0355  01/03/15 0410  WBC 22.1* 21.4* 18.3*  --  15.0* 14.0* 13.5*  NEUTROABS 18.6*  --   --   --   --   --   --   HGB 7.3* 7.7* 7.6* 9.4* 8.9* 9.1* 9.1*  HCT 24.7* 26.6* 26.4* 30.9* 30.3* 29.7* 30.3*  MCV 91.1 89.6 89.5  --  88.9 89.5 90.2  PLT 445* 498* 498*  --  474* 491* 443*   Cardiac Enzymes: No results for input(s): CKTOTAL, CKMB, CKMBINDEX, TROPONINI in the last 168 hours. BNP (last 3 results) No results for input(s): BNP in the last 8760 hours.  ProBNP (last 3 results) No results for input(s): PROBNP in the last 8760 hours.  CBG: No results for input(s): GLUCAP in the last 168 hours.  Recent Results (  from the past 240 hour(s))  Blood culture (routine x 2)     Status: None (Preliminary result)   Collection Time: 12/29/14  3:25 PM  Result Value Ref Range Status   Specimen Description BLOOD RIGHT ANTECUBITAL  Final   Special Requests BOTTLES DRAWN AEROBIC AND ANAEROBIC 5CC  Final   Culture   Final    NO GROWTH 4 DAYS Performed at [facility]    Report Status PENDING  Incomplete  Blood culture (routine x 2)     Status: None   Collection Time: 12/29/14  3:33 PM  Result Value Ref Range Status   Specimen Description BLOOD RIGHT HAND  Final   Special Requests BOTTLES DRAWN AEROBIC AND ANAEROBIC 3CC  Final   Culture  Setup Time   Final    GRAM POSITIVE RODS AEROBIC BOTTLE ONLY CRITICAL RESULT CALLED TO, READ BACK BY AND VERIFIED WITH: Kristie Cowman [id number] [place]    Culture   Final    BACILLUS SPECIES Standardized susceptibility testing for this organism is not available. Performed at [facility]    Report Status 01/03/2015 FINAL  Final  Urine culture     Status: None   Collection Time: 12/29/14  5:00 PM  Result Value Ref Range Status   Specimen Description URINE, CLEAN CATCH  Final   Special Requests NONE  Final   Culture   Final    NO GROWTH 1 DAY Performed at [facility]    Report Status 12/31/2014 FINAL  Final  Culture,  sputum-assessment     Status: None   Collection Time: 12/29/14 11:15 PM  Result Value Ref Range Status   Specimen Description SPU  Final   Special Requests Immunocompromised  Final   Sputum evaluation THIS SPECIMEN IS ACCEPTABLE FOR SPUTUM CULTURE  Final   Report Status 12/29/2014 FINAL  Final  Culture, respiratory (NON-Expectorated)     Status: None   Collection Time: 12/29/14 11:45 PM  Result Value Ref Range Status   Specimen Description SPUTUM  Final   Special Requests NONE  Final   Gram Stain   Final    ABUNDANT WBC PRESENT, PREDOMINANTLY PMN RARE SQUAMOUS EPITHELIAL CELLS PRESENT MODERATE GRAM POSITIVE COCCI IN PAIRS FEW GRAM NEGATIVE RODS FEW GRAM NEGATIVE COCCI    Culture   Final    NORMAL OROPHARYNGEAL FLORA Performed at [facility]    Report Status 01/01/2015 FINAL  Final     Studies: No results found.  Scheduled Meds:  Scheduled Meds: . azithromycin  500 mg Intravenous Q24H  . benzonatate  100 mg Oral TID  . docusate sodium  100 mg Oral BID  . ferrous sulfate  325 mg Oral BID WC  . OxyCODONE  75 mg Oral Q12H  . piperacillin-tazobactam (ZOSYN)  IV  3.375 g Intravenous 3 times per day   Continuous Infusions: . sodium chloride 20 mL/hr at 12/29/14 1921    Time spent on care of this patient: 70 min   Iara Monds, MD 01/03/2015, 12:28 PM  LOS: 5 days   [facility] Office  [phone] Pager - Text Page per [email or web address] If 7PM-7AM, please contact night-coverage [email or web address]

## 2015-01-03 NOTE — Progress Notes (Signed)
SATURATION QUALIFICATIONS: (This note is used to comply with regulatory documentation for home oxygen)  Patient Saturations on Room Air at Rest = 94%  Patient Saturations on Room Air while Ambulating = 94%  Patient Saturations on 0 Liters of oxygen while Ambulating = 94%  Please briefly explain why patient needs home oxygen: 

## 2015-01-03 NOTE — Progress Notes (Signed)
Pt ambulated 300' in hall. O2 sat and pulse measured at rest and while ambulating. At rest- 94% /106. Ambulating- 94%/ 118.

## 2015-01-04 LAB — CBC
HCT: 29.3 % — ABNORMAL LOW (ref 36.0–46.0)
HEMOGLOBIN: 8.8 g/dL — AB (ref 12.0–15.0)
MCH: 27.2 pg (ref 26.0–34.0)
MCHC: 30 g/dL (ref 30.0–36.0)
MCV: 90.7 fL (ref 78.0–100.0)
Platelets: 385 10*3/uL (ref 150–400)
RBC: 3.23 MIL/uL — AB (ref 3.87–5.11)
RDW: 18.9 % — ABNORMAL HIGH (ref 11.5–15.5)
WBC: 10.4 10*3/uL (ref 4.0–10.5)

## 2015-01-04 MED ORDER — METRONIDAZOLE 500 MG PO TABS: 500.0000 mg | ORAL_TABLET | Freq: Three times a day (TID) | ORAL | Status: DC

## 2015-01-04 MED ORDER — HYDROCODONE-HOMATROPINE 5-1.5 MG/5ML PO SYRP: 5.0000 mL | ORAL_SOLUTION | ORAL | Status: DC | PRN

## 2015-01-04 MED ORDER — FERROUS SULFATE 325 (65 FE) MG PO TABS: 325.0000 mg | ORAL_TABLET | Freq: Two times a day (BID) | ORAL | Status: DC

## 2015-01-04 MED ORDER — OXYCODONE HCL 10 MG PO TABS: 10.0000 mg | ORAL_TABLET | Freq: Four times a day (QID) | ORAL | Status: DC | PRN

## 2015-01-04 MED ORDER — OXYCODONE HCL ER 15 MG PO T12A: 75.0000 mg | EXTENDED_RELEASE_TABLET | Freq: Two times a day (BID) | ORAL | Status: DC

## 2015-01-04 MED ORDER — BENZONATATE 100 MG PO CAPS: 100.0000 mg | ORAL_CAPSULE | Freq: Three times a day (TID) | ORAL | Status: DC

## 2015-01-04 MED ORDER — LEVOFLOXACIN 750 MG PO TABS: 750.0000 mg | ORAL_TABLET | Freq: Every day | ORAL | Status: DC

## 2015-01-04 NOTE — Discharge Summary (Signed)
Physician Discharge Summary  Barbara Frost MRN: [id number] DOB/AGE: 05/18/1970 44 y.o.  PCP: [name], MD   Admit date: 12/29/2014 Discharge date: 01/04/2015  Discharge Diagnoses:   Principal Problem:   Community acquired pneumonia Active Problems:   Non-small cell carcinoma of lung, stage 3   Pneumonia   Lung abscess    Follow-up recommendations Follow-up with PCP in 3-5 days , including all  additional recommended appointments as below Follow-up CBC, CMP in 3-5 days      Medication List    STOP taking these medications        amoxicillin 500 MG capsule  Commonly known as:  AMOXIL     aspirin 325 MG EC tablet     ibuprofen 200 MG tablet  Commonly known as:  ADVIL,MOTRIN     ibuprofen 600 MG tablet  Commonly known as:  ADVIL,MOTRIN     morphine 30 MG 12 hr tablet  Commonly known as:  MS CONTIN     oxyCODONE-acetaminophen 10-325 MG per tablet  Commonly known as:  PERCOCET     oxyCODONE-acetaminophen 5-325 MG per tablet  Commonly known as:  PERCOCET/ROXICET      TAKE these medications        ADULT GUMMY Chew  Chew 1 capsule by mouth daily.     albuterol 108 (90 BASE) MCG/ACT inhaler  Commonly known as:  PROVENTIL HFA;VENTOLIN HFA  Inhale 1-2 puffs into the lungs every 6 (six) hours as needed for wheezing or shortness of breath. Use 2 puffs 3 times daily x 4 days, then every 6 hours as needed.     benzonatate 100 MG capsule  Commonly known as:  TESSALON  Take 1 capsule (100 mg total) by mouth 3 (three) times daily.     docusate sodium 100 MG capsule  Commonly known as:  COLACE  Take 1 capsule (100 mg total) by mouth 2 (two) times daily.     ferrous sulfate 325 (65 FE) MG tablet  Take 1 tablet (325 mg total) by mouth 2 (two) times daily with a meal.     HYDROcodone-homatropine 5-1.5 MG/5ML syrup  Commonly known as:  HYCODAN  Take 5 mLs by mouth every 4 (four) hours as needed for cough.     levofloxacin 750 MG tablet  Commonly known as:   LEVAQUIN  Take 1 tablet (750 mg total) by mouth daily.     metroNIDAZOLE 500 MG tablet  Commonly known as:  FLAGYL  Take 1 tablet (500 mg total) by mouth every 8 (eight) hours.     Oxycodone HCl 10 MG Tabs  Take 1 tablet (10 mg total) by mouth every 6 (six) hours as needed for breakthrough pain.     OxyCODONE 15 mg T12a 12 hr tablet  Commonly known as:  OXYCONTIN  Take 5 tablets (75 mg total) by mouth every 12 (twelve) hours.     Potassium Chloride ER 20 MEQ Tbcr  Take 20 mEq by mouth daily.     PRESCRIPTION MEDICATION  Chemo - [facility]     prochlorperazine 10 MG tablet  Commonly known as:  COMPAZINE  Take 1 tablet (10 mg total) by mouth every 6 (six) hours as needed for nausea or vomiting.         Discharge Condition: Stable  Disposition: 01-Home or Self Care   Consults:    Significant Diagnostic Studies:  Dg Chest 2 View  12/29/2014   CLINICAL DATA:  Constant right chest pain for 3-4 months. Coughing up blood. History  of fevers. History of lung cancer. Last chemotherapy 12/03/2014.  EXAM: CHEST  2 VIEW  COMPARISON:  09/01/2014; 08/13/2014; chest CT- 12/21/2014  FINDINGS: Grossly unchanged cardiac silhouette and mediastinal contours. The amount of air and fluid within the cavitary right upper lobe mass appears grossly unchanged when compared to chest CT performed 12/21/2014 with persistent irregular rind like opacification. There is persistent right-sided volume loss with slight worsening of right medial basilar heterogeneous opacities, likely atelectasis. There is grossly unchanged mild diffuse nodular thickening of the pulmonary interstitium as better demonstrated on preceding chest CT. No definite pleural effusion or evidence of edema. Unchanged bones.  IMPRESSION: No definitive interval change since chest CT performed 12/21/2014. Specifically, no definitive change in known cavitary air and fluid containing mass within the right upper lobe.   Electronically Signed   By: Sandi Mariscal M.D.   On: 12/29/2014 15:05   Ct Chest W Contrast  12/21/2014   CLINICAL DATA:  Lung cancer diagnosed 5 months ago. Chemotherapy and radiation therapy completed. Upper chest pain and shortness of breath. Subsequent encounter)  EXAM: CT CHEST WITH CONTRAST  TECHNIQUE: Multidetector CT imaging of the chest was performed during intravenous contrast administration.  CONTRAST:  30mL OMNIPAQUE IOHEXOL 300 MG/ML  SOLN  COMPARISON:  CTs 09/24/2014 and 09/02/2014.  PET-CT 07/11/2014.  FINDINGS: Mediastinum/Nodes: 11 mm right hilar node on image 22 appears about the same. No enlarged mediastinal or axillary lymph nodes are demonstrated. There are probable small reactive right paratracheal nodes. There is increased edema throughout the mediastinal fat with mild proximal esophageal wall thickening, likely related to radiation therapy. The heart size is normal. There is a new small pericardial effusion. There are no significant vascular findings.  Lungs/Pleura: There has been further cavitation of the large right apical mass which occupies most of the upper lobe and is difficult to accurately measure. This cavitary mass has persistent irregularly thick walls. The central air has increased. There is a small amount of adjacent loculated pleural fluid. The extrapleural fat planes are indistinct, especially posteriorly. There is no chest wall destruction. There is new focal consolidation laterally in the superior segment of the right lower lobe. There are multiple nodular airspace opacities elsewhere throughout the lungs. There is no central bronchovascular nodularity to suggest lymphangitic spread of tumor.  Upper abdomen:  Unremarkable.  There is no adrenal mass.  Musculoskeletal/Chest wall: There is no chest wall mass or suspicious osseous finding. No definite chest wall extension of tumor identified. Mild Schmorl's node formation in the upper lumbar spine appears unchanged.  IMPRESSION: 1. New consolidation laterally in  the superior segment of the right lower lobe, likely related to radiation therapy. 2. There are multiple other ill-defined nodular airspace opacities throughout the lungs which appear inflammatory, likely infectious. Drug reaction would be a consideration. This appearance is atypical for metastatic disease. 3. The large cavitary right upper lobe mass has not grossly changed in size and has persistent thickened walls, although does show increased central cavitation. The extrapleural and mediastinal fat planes surrounding this lesion are ill-defined, probably related to radiation therapy. No definite chest wall invasion or distant metastases identified. 4. New small pericardial effusion. 5. Follow-up PET-CT may be helpful to assess for residual metabolic activity within the right apical lesion.   Electronically Signed   By: Richardean Sale M.D.   On: 12/21/2014 10:40       Filed Weights   12/29/14 1805  Weight: 56.019 kg (123 lb 8 oz)  Microbiology: Recent Results (from the past 240 hour(s))  Blood culture (routine x 2)     Status: None   Collection Time: 12/29/14  3:25 PM  Result Value Ref Range Status   Specimen Description BLOOD RIGHT ANTECUBITAL  Final   Special Requests BOTTLES DRAWN AEROBIC AND ANAEROBIC 5CC  Final   Culture   Final    NO GROWTH 5 DAYS Performed at [facility]    Report Status 01/03/2015 FINAL  Final  Blood culture (routine x 2)     Status: None   Collection Time: 12/29/14  3:33 PM  Result Value Ref Range Status   Specimen Description BLOOD RIGHT HAND  Final   Special Requests BOTTLES DRAWN AEROBIC AND ANAEROBIC 3CC  Final   Culture  Setup Time   Final    GRAM POSITIVE RODS AEROBIC BOTTLE ONLY CRITICAL RESULT CALLED TO, READ BACK BY AND VERIFIED WITH: Kristie Cowman [id number] Chauncey    Culture   Final    BACILLUS SPECIES Standardized susceptibility testing for this organism is not available. Performed at [facility]    Report Status  01/03/2015 FINAL  Final  Urine culture     Status: None   Collection Time: 12/29/14  5:00 PM  Result Value Ref Range Status   Specimen Description URINE, CLEAN CATCH  Final   Special Requests NONE  Final   Culture   Final    NO GROWTH 1 DAY Performed at [facility]    Report Status 12/31/2014 FINAL  Final  Culture, sputum-assessment     Status: None   Collection Time: 12/29/14 11:15 PM  Result Value Ref Range Status   Specimen Description SPU  Final   Special Requests Immunocompromised  Final   Sputum evaluation THIS SPECIMEN IS ACCEPTABLE FOR SPUTUM CULTURE  Final   Report Status 12/29/2014 FINAL  Final  Culture, respiratory (NON-Expectorated)     Status: None   Collection Time: 12/29/14 11:45 PM  Result Value Ref Range Status   Specimen Description SPUTUM  Final   Special Requests NONE  Final   Gram Stain   Final    ABUNDANT WBC PRESENT, PREDOMINANTLY PMN RARE SQUAMOUS EPITHELIAL CELLS PRESENT MODERATE GRAM POSITIVE COCCI IN PAIRS FEW GRAM NEGATIVE RODS FEW GRAM NEGATIVE COCCI    Culture   Final    NORMAL OROPHARYNGEAL FLORA Performed at [facility]    Report Status 01/01/2015 FINAL  Final       Blood Culture    Component Value Date/Time   SDES SPUTUM 12/29/2014 2345   SPECREQUEST NONE 12/29/2014 2345   CULT  12/29/2014 2345    NORMAL OROPHARYNGEAL FLORA Performed at [facility] 01/01/2015 FINAL 12/29/2014 2345      Labs: Results for orders placed or performed during the hospital encounter of 12/29/14 (from the past 48 hour(s))  Comprehensive metabolic panel     Status: Abnormal   Collection Time: 01/03/15  4:10 AM  Result Value Ref Range   Sodium 133 (L) 135 - 145 mmol/L   Potassium 4.5 3.5 - 5.1 mmol/L   Chloride 95 (L) 101 - 111 mmol/L   CO2 31 22 - 32 mmol/L   Glucose, Bld 113 (H) 65 - 99 mg/dL   BUN 7 6 - 20 mg/dL   Creatinine, Ser 0.65 0.44 - 1.00 mg/dL   Calcium 8.8 (L) 8.9 - 10.3 mg/dL   Total Protein  7.7 6.5 - 8.1 g/dL   Albumin 2.3 (  L) 3.5 - 5.0 g/dL   AST 25 15 - 41 U/L   ALT 15 14 - 54 U/L   Alkaline Phosphatase 86 38 - 126 U/L   Total Bilirubin 0.6 0.3 - 1.2 mg/dL   GFR calc non Af Amer >60 >60 mL/min   GFR calc Af Amer >60 >60 mL/min    Comment: (NOTE) The eGFR has been calculated using the CKD EPI equation. This calculation has not been validated in all clinical situations. eGFR's persistently <60 mL/min signify possible Chronic Kidney Disease.    Anion gap 7 5 - 15  CBC     Status: Abnormal   Collection Time: 01/03/15  4:10 AM  Result Value Ref Range   WBC 13.5 (H) 4.0 - 10.5 K/uL   RBC 3.36 (L) 3.87 - 5.11 MIL/uL   Hemoglobin 9.1 (L) 12.0 - 15.0 g/dL   HCT 30.3 (L) 36.0 - 46.0 %   MCV 90.2 78.0 - 100.0 fL   MCH 27.1 26.0 - 34.0 pg   MCHC 30.0 30.0 - 36.0 g/dL   RDW 18.9 (H) 11.5 - 15.5 %   Platelets 443 (H) 150 - 400 K/uL  CBC     Status: Abnormal   Collection Time: 01/04/15  5:01 AM  Result Value Ref Range   WBC 10.4 4.0 - 10.5 K/uL   RBC 3.23 (L) 3.87 - 5.11 MIL/uL   Hemoglobin 8.8 (L) 12.0 - 15.0 g/dL   HCT 29.3 (L) 36.0 - 46.0 %   MCV 90.7 78.0 - 100.0 fL   MCH 27.2 26.0 - 34.0 pg   MCHC 30.0 30.0 - 36.0 g/dL   RDW 18.9 (H) 11.5 - 15.5 %   Platelets 385 150 - 400 K/uL     Lipid Panel  No results found for: CHOL, TRIG, HDL, CHOLHDL, VLDL, LDLCALC, LDLDIRECT   No results found for: HGBA1C   Lab Results  Component Value Date   CREATININE 0.65 01/03/2015     HPI :*Barbara Frost is a 44 y.o. female with "at least" a stage IIIa (T3, N2, M0) non-small cell lung cancer, squamous cell carcinoma presented with large right upper lobe mass with questionable mediastinal invasion diagnosed in February 2016 per Dr. Worthy Flank notes. Admitted for severe cough with bloody/yellow sputum and fevers of 101-104 despite being on Augmentin. Recent CT scan as outpatient revealed multifocal infiltrates and a air fluid level in RUL necrotic mass.   HOSPITAL COURSE:   Pneumonia- fevers, leukocytosis, gross hemoptysis CT chest 12/21/14 - large right upper lobe cavity with thick walls-unchanged from prior . The consolidation in superior segment of the right lower lobe, with multiple nodular airspace disease throughout the lungs Patient presented with tachycardia,- large amounts of yellow sputum -is bloody  Pulmonary consultation was obtained -Failed Augmentin treatment-is likely immune compromised from recent chemotherapy and therefore, she was placed on vancomycin, Zosyn, Azithromycin- per pulmonary patient can be discharged home once the white count has normalized and symptoms have improved, patient has been switched to Levaquin and Flagyl for another 3 weeks, outpatient follow-up with pulmonary has been arranged by Dr. Elsworth Soho - urinary strep neg - Legionella negative Hycodan for cough    Gr pos rods in 1 set of blood cultures -Bacillus species- likely contaminant-   Right-sided chest pain -in Relation to cancer and possibly also pleuritic -Increase OxyContin to 75 twice a day today -cont oxycodone (no percocet so that fevers are not masked) at a dose of 10 mg every 4 when necessary --Continue Colace  to prevent constipation -when necessary morphine for severe breakthrough pain- hopefully we can get her pain controlled to a point where she does not need IV morphine and then can be discharged home    Non-small cell carcinoma of lung, stage 3 -Follows with Dr. Earlie Server - She has had radiation and has recently completed chemotherapy -Currently disease is "stable" and she will have a repeat CT in 3 months   Anemia-likely secondary to acute illness-underlying iron deficiency -Likely secondary to acute illness-transfused 1 unit packed red blood cells bringing hemoglobin from 7.6-8.9 -anemia panel shows low iron levels although ferritin is high (acute phase reactant)-patient has been provided with a prescription for twice a day ferrous sulfate-monitor  for constipation    Discharge Exam:Blood pressure 94/56, pulse 104, temperature 100 F (37.8 C), temperature source Oral, resp. rate 16, height 5' (1.524 m), weight 56.019 kg (123 lb 8 oz), last menstrual period 07/18/2014, SpO2 94 %.  General: Pt is alert, not in acute distress  HEENT: No icterus, No thrush, oral mucosa moist  Cardiovascular: regular rate and rhythm, S1/S2 No murmur  Respiratory: clear to auscultation bilaterally   Abdomen: Soft, +Bowel sounds, non tender, non distended, no guarding  MSK: No LE edema, cyanosis or clubbing        Discharge Instructions    Diet - low sodium heart healthy    Complete by:  As directed      Increase activity slowly    Complete by:  As directed            Follow-up Information    Follow up with [name], MD.   Specialty:  Internal Medicine   Why:  Pt has an appt on Tuesday Sept 6 at 11:45.   Contact information:   [place]. [place] [address] [phone]       Follow up with PARRETT,TAMMY, NP On 01/22/2015.   Specialty:  Nurse Practitioner   Why:  2 pm   Contact information:   [address] [place] [address] [phone]       Follow up with [name], MD. Schedule an appointment as soon as possible for a visit in 3 weeks.   Specialty:  Internal Medicine   Contact information:   [place] [address] [phone]       Signed: Reyne Dumas 01/04/2015, 12:18 PM        Time spent >45 mins

## 2015-01-04 NOTE — Progress Notes (Signed)
Nursing Discharge Summary  Patient ID: Barbara Frost MRN: [id number] DOB/AGE: [birth year] 44 y.o.  Admit date: 12/29/2014 Discharge date: 01/04/2015  Discharged Condition: good  Disposition: 01-Home or Self Care  Follow-up Information    Follow up with [name], MD.   Specialty:  Internal Medicine   Why:  Pt has an appt on Tuesday Sept 6 at 11:45.   Contact information:   [address] [place] [address] [phone]       Follow up with PARRETT,TAMMY, NP On 01/22/2015.   Specialty:  Nurse Practitioner   Why:  2 pm   Contact information:   [address] [place] [address] [phone]       Follow up with [name], MD. Schedule an appointment as soon as possible for a visit in 3 weeks.   Specialty:  Internal Medicine   Contact information:   [place] [address] [phone]       Prescriptions Given: Patient medications discussed.  Patient given prescriptions for Oxycontin, Oxycodone, Hycodan, Flagyl and Levaquin.  Patient follow up appointments discussed.  Patient and mother at bedside both verbalized understanding without further questions.    Means of Discharge: Patient transported downstairs via wheelchair to be discharged home via private vehicle.   Signed: Buel Ream 01/04/2015, 2:38 PM

## 2015-01-22 ENCOUNTER — Encounter: Payer: Self-pay | Admitting: Adult Health

## 2015-01-22 ENCOUNTER — Encounter: Payer: Self-pay | Admitting: Radiation Oncology

## 2015-01-22 ENCOUNTER — Ambulatory Visit (INDEPENDENT_AMBULATORY_CARE_PROVIDER_SITE_OTHER)
Admission: RE | Admit: 2015-01-22 | Discharge: 2015-01-22 | Disposition: A | Discharge status: Home / Self Care | Payer: Medicaid Other | Source: Ambulatory Visit | Attending: Adult Health | Admitting: Adult Health

## 2015-01-22 ENCOUNTER — Ambulatory Visit
Admission: RE | Admit: 2015-01-22 | Discharge: 2015-01-22 | Disposition: A | Discharge status: Home / Self Care | Payer: Medicaid Other | Source: Ambulatory Visit | Attending: Radiation Oncology | Admitting: Radiation Oncology

## 2015-01-22 ENCOUNTER — Ambulatory Visit (INDEPENDENT_AMBULATORY_CARE_PROVIDER_SITE_OTHER): Payer: Medicaid Other | Admitting: Adult Health

## 2015-01-22 VITALS — BP 104/73 | HR 109 | Temp 98.7°F | Ht 60.0 in | Wt 118.5 lb

## 2015-01-22 VITALS — BP 106/80 | HR 107 | Temp 98.8°F | Ht 62.0 in | Wt 119.0 lb

## 2015-01-22 DIAGNOSIS — J852 Abscess of lung without pneumonia: Secondary | ICD-10-CM | POA: Diagnosis not present

## 2015-01-22 DIAGNOSIS — J189 Pneumonia, unspecified organism: Secondary | ICD-10-CM | POA: Diagnosis not present

## 2015-01-22 DIAGNOSIS — C3491 Malignant neoplasm of unspecified part of right bronchus or lung: Secondary | ICD-10-CM

## 2015-01-22 MED ORDER — METRONIDAZOLE 500 MG PO TABS: 500.0000 mg | ORAL_TABLET | Freq: Three times a day (TID) | ORAL | Status: DC

## 2015-01-22 MED ORDER — HYDROCODONE-HOMATROPINE 5-1.5 MG/5ML PO SYRP: 5.0000 mL | ORAL_SOLUTION | ORAL | Status: DC | PRN

## 2015-01-22 MED ORDER — LEVOFLOXACIN 500 MG PO TABS: 500.0000 mg | ORAL_TABLET | Freq: Every day | ORAL | Status: DC

## 2015-01-22 NOTE — Progress Notes (Signed)
Subjective:    Patient ID: Barbara Frost, female    DOB: [birth year], 44 y.o.   MRN: [id number]  HPI 44 yo former smoker with recently dx Stage IIIa  squamous cell carcinoma in Feb  2016 . Tx w/ concurrent chemoradiation w/ last dose in 08/20/14 and then 3 cycles of chemo in August. Seen for pulmonary consult for lung abscess during hopsital admission .   TEST  CT chest 12/21/14  Large right upper lobe cavity with thick walls , consolidation in RLL and multiple nodular airspace disease throughout the lungs.   01/22/2015 [facility] follow up : Lung Abscess/ Lung Cancer  Pt returns for post hospital follow up . She was admitted 8/27-01/04/15 for lung abscess.  Pt presented with increased cough and congestion w/ bloody mucus. CT showed a large right upper lobe cavity and PNA . She was tx w/ aggressive IV abx . Discharged on Levaquin and Flagyl  She has 2 days left of abx.  She is starting to feel better. Congestion is better but still has cough throughout the day.  CXR today shows decreased fluid in cavitary mass in RUL .  She denies fever , hemoptysis or n/v/d.  Appetite is fair  She is no longer smoking.  On O2 2l/m At bedtime  .  Quit smoking in April , smoked 1/2-1 PPD for 25-30 yrs.  Uses albuterol inhaler As needed  .  Does get dyspnea with walking, none at rest .   Follows with Oncology . Plans for repeat CT chest in Nov 2016 .  Chemo on hold    Review of Systems  Constitutional:   No  weight loss, night sweats,  Fevers, chills,  +fatigue, or  lassitude.  HEENT:   No headaches,  Difficulty swallowing,  Tooth/dental problems, or  Sore throat,                No sneezing, itching, ear ache, nasal congestion, post nasal drip,   CV:  No chest pain,  Orthopnea, PND, swelling in lower extremities, anasarca, dizziness, palpitations, syncope.   GI  No heartburn, indigestion, abdominal pain, nausea, vomiting, diarrhea, change in bowel habits, loss of appetite, bloody stools.   Resp:     No chest wall deformity  Skin: no rash or lesions.  GU: no dysuria, change in color of urine, no urgency or frequency.  No flank pain, no hematuria   MS:  No joint pain or swelling.  No decreased range of motion.  No back pain.  Psych:  No change in mood or affect. No depression or anxiety.  No memory loss.          Objective:   Physical Exam  GEN: A/Ox3; pleasant , NAD   HEENT:  [place]/AT,  EACs-clear, TMs-wnl, NOSE-clear, THROAT-clear, no lesions, no postnasal drip or exudate noted. , poor dentition   NECK:  Supple w/ fair ROM; no JVD; normal carotid impulses w/o bruits; no thyromegaly or nodules palpated; no lymphadenopathy.  RESP  Decreased BS in bases  no accessory muscle use, no dullness to percussion  CARD:  RRR, no m/r/g  , no peripheral edema, pulses intact, no cyanosis or clubbing.  GI:   Soft & nt; nml bowel sounds; no organomegaly or masses detected.  Musco: Warm bil, no deformities or joint swelling noted.   Neuro: alert, no focal deficits noted.    Skin: Warm, no lesions or rashes  CXR 01/22/2015 reviewed independently   Decreased fluid in the cavitary mass in  the right upper lobe. 2. Decreased adjacent pneumonia. 3. Interval linear atelectasis at the left lung base.     Assessment & Plan:

## 2015-01-22 NOTE — Addendum Note (Signed)
Addended by: Osa Craver on: 01/22/2015 03:20 PM   Modules accepted: Orders

## 2015-01-22 NOTE — Progress Notes (Signed)
Barbara Frost here for reassessment s/p xrt to her right lung.  She recently was hospitalized for pneumonia and anemia, discharged on 01/04/15 .  Currently oxygen at bedtime.  Yellow expectorant at this time.

## 2015-01-22 NOTE — Assessment & Plan Note (Signed)
Right sided lung abscess improving on abx  Will extend for 1 additional week   Plan   Extend Levaquin and Flagyl for 1 additional week  Continue on Oxygen At bedtime   Follow up Dr. Elsworth Soho  In 4 weeks with chest xray and As needed   Please contact office for sooner follow up if symptoms do not improve or worsen or seek emergency care

## 2015-01-22 NOTE — Patient Instructions (Signed)
Extend Levaquin and Flagyl for 1 additional week  Continue on Oxygen At bedtime   Follow up Dr. Elsworth Soho  In 4 weeks with chest xray and As needed   Please contact office for sooner follow up if symptoms do not improve or worsen or seek emergency care

## 2015-01-22 NOTE — Progress Notes (Signed)
Reviewed & agree with plan  

## 2015-01-22 NOTE — Assessment & Plan Note (Signed)
Cont follow up with oncology  CT planned for NOV

## 2015-01-22 NOTE — Progress Notes (Addendum)
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  She was recently discharged from the hospital on September 2 after being treated for a community-acquired pneumonia.  His 2 more days of antibiotic.  She'll see Dr. Julien Nordmann for a follow-up visit on November 21.  She continues to have a cough for which she takes Hycodan syrup.  She lost approximately 12 pounds over the past 3 months, but recently gained back 4 pounds taking diet supplements.  Her appetite is slowly improving.  She is scheduled for a follow-up CT scan in November.  She'll see pulmonary medicine later today.  She does use supplemental oxygen at night.  Her CT scan on 12/21/2014 showed new consolidation laterally in the superior segment of the right lower lobe felt to be related to radiation therapy.  There were other multiple ill-defined nodular opacities throughout the lungs which appeared to be inflammatory rather than representing metastatic disease.  The large cavitary right upper lobe mass had not changed in size.  Physical examination: Alert and oriented. Filed Vitals:   01/22/15 1016  BP: 104/73  Pulse: 109  Temp: 98.7 F (37.1 C)   Nodes: There is no palpable cervical, or supraclavicular lymphadenopathy.  Chest: There are diminished breath sounds along the right upper lung zone, lungs are otherwise clear.  Extremities: Without edema.  Impression: Clinically stable, recovering from community-acquired pneumonia.  Plan: She'll maintain follow-up with Dr. Julien Nordmann he will see her in November.  She is scheduled for a CT scan at that time.  I've not scheduled the patient for a formal follow-up visit, but we would be more than happy to see her in the future should the need arise.

## 2015-02-06 ENCOUNTER — Telehealth: Payer: Self-pay | Admitting: Medical Oncology

## 2015-02-06 ENCOUNTER — Other Ambulatory Visit: Payer: Self-pay | Admitting: Internal Medicine

## 2015-02-06 DIAGNOSIS — R110 Nausea: Secondary | ICD-10-CM

## 2015-02-06 DIAGNOSIS — R059 Cough, unspecified: Secondary | ICD-10-CM

## 2015-02-06 DIAGNOSIS — R05 Cough: Secondary | ICD-10-CM

## 2015-02-06 DIAGNOSIS — C3490 Malignant neoplasm of unspecified part of unspecified bronchus or lung: Secondary | ICD-10-CM

## 2015-02-06 MED ORDER — FERROUS SULFATE 325 (65 FE) MG PO TABS: 325.0000 mg | ORAL_TABLET | Freq: Two times a day (BID) | ORAL | Status: DC

## 2015-02-06 MED ORDER — BENZONATATE 100 MG PO CAPS: 100.0000 mg | ORAL_CAPSULE | Freq: Three times a day (TID) | ORAL | Status: DC

## 2015-02-06 MED ORDER — HYDROCODONE-HOMATROPINE 5-1.5 MG/5ML PO SYRP: 5.0000 mL | ORAL_SOLUTION | ORAL | Status: DC | PRN

## 2015-02-06 MED ORDER — PROCHLORPERAZINE MALEATE 10 MG PO TABS: 10.0000 mg | ORAL_TABLET | Freq: Four times a day (QID) | ORAL | Status: DC | PRN

## 2015-02-06 MED ORDER — OXYCODONE HCL 10 MG PO TABS: 10.0000 mg | ORAL_TABLET | Freq: Four times a day (QID) | ORAL | Status: DC | PRN

## 2015-02-06 NOTE — Telephone Encounter (Signed)
Request pain med refill , benzononate for coughing esp HS , iron pills and compazine. Rx sent to local pharmacy adn pt notified to pick up rx tomorrow for pain and cough. Albuterol needs to be filled by PCP

## 2015-02-08 ENCOUNTER — Encounter: Payer: Self-pay | Admitting: *Deleted

## 2015-02-19 ENCOUNTER — Ambulatory Visit (INDEPENDENT_AMBULATORY_CARE_PROVIDER_SITE_OTHER): Payer: Medicaid Other | Admitting: Adult Health

## 2015-02-19 ENCOUNTER — Encounter: Payer: Self-pay | Admitting: Adult Health

## 2015-02-19 ENCOUNTER — Ambulatory Visit (INDEPENDENT_AMBULATORY_CARE_PROVIDER_SITE_OTHER)
Admission: RE | Admit: 2015-02-19 | Discharge: 2015-02-19 | Disposition: A | Discharge status: Home / Self Care | Payer: Medicaid Other | Source: Ambulatory Visit | Attending: Adult Health | Admitting: Adult Health

## 2015-02-19 VITALS — BP 134/78 | HR 107 | Temp 97.7°F | Ht 62.0 in | Wt 130.6 lb

## 2015-02-19 DIAGNOSIS — J851 Abscess of lung with pneumonia: Secondary | ICD-10-CM

## 2015-02-19 DIAGNOSIS — Z23 Encounter for immunization: Secondary | ICD-10-CM | POA: Diagnosis not present

## 2015-02-19 DIAGNOSIS — M7989 Other specified soft tissue disorders: Secondary | ICD-10-CM | POA: Diagnosis not present

## 2015-02-19 DIAGNOSIS — J189 Pneumonia, unspecified organism: Secondary | ICD-10-CM

## 2015-02-19 DIAGNOSIS — C3490 Malignant neoplasm of unspecified part of unspecified bronchus or lung: Secondary | ICD-10-CM

## 2015-02-19 NOTE — Assessment & Plan Note (Signed)
Right arm swelling ? Etiology  No sign swelling noted on exam  Recent CT in August with no chest wall invasion or axillary lymph nodes with underlying cancer  No hx of porta cath  Will check venous doppler to r/o upper extremity DVT  follow up for CT next month  If not improving will need to see PCP .

## 2015-02-19 NOTE — Assessment & Plan Note (Signed)
RUL lung abscess in setting of lung cancer  She is clinically improved . cxr shows stable abscess .  No further abx at this time  follow up for CT chest next month as planned

## 2015-02-19 NOTE — Patient Instructions (Signed)
We are setting you up for venous doppler of right arm.  If swelling is not improving will need to see primary doctor.  Flu shot today  Follow up for CT chest next month as planned  Follow up with Oncology next month as planned  Follow up Dr. Elsworth Soho  In 2 months and As needed

## 2015-02-19 NOTE — Assessment & Plan Note (Addendum)
Continue follow up with oncology next month as planned with CT chest  Spirometry shows mild restrictive disease with FVC at 76%, FEV1 67%, ratio 74.

## 2015-02-19 NOTE — Progress Notes (Signed)
Subjective:    Patient ID: Barbara Frost, female    DOB: 1970/08/29, 44 y.o.   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 Tx w/ concurrent chemoradiation w/ last dose in 08/20/14 and then 3 cycles of chemo in August. Seen for pulmonary consult for lung abscess during [place] admission 12/31/14 .   TEST  CT chest 12/21/14  Large right upper lobe cavity with thick walls , consolidation in RLL and multiple nodular airspace disease throughout the lungs.   02/19/2015 Follow up : : Lung Abscess/ Lung Cancer  Pt returns for 4 week follow up . She has Stage IIIa squamous cell carcinoma, followed by Dr. Julien Nordmann .  Was admitted admitted 8/27-01/04/15 for lung abscess.  CT showed a large right upper lobe cavity and PNA . She was tx w/ aggressive IV abx . Discharged on Levaquin and Flagyl. This was extended for total of 5 weeks of abx . She is feeling much better. Cough is almost totally gone. Appetite is good w/ no n/v/d. Wt has increased .  CXR today showed stable RUL cavitary mass with small air fluid level.   She is no longer smoking. No longer using O2 At bedtime  . -wants o2 removed.  Quit smoking in April , smoked 1/2-1 PPD for 25-30 yrs.  Uses albuterol inhaler As needed  .  Does get dyspnea with walking, none at rest . Dyspnea is much less.  Spirometry today shows FEV1 67%, ratio 74 , FVC 76%.   Follows with Oncology next month . Plans for repeat CT chest in Nov 2016 .   Does complain of right arm swelling for last week. Some soreness along upper shoulder.  No known injury . Swelling seems to come and go.  Previous CT reviewed in August did not show chest wall invasion or distant mets.  No enlarged axillary nodes .  No hx of porta cath    Review of Systems  Constitutional:   No  weight loss, night sweats,  Fevers, chills,  +fatigue, or  lassitude.  HEENT:   No headaches,  Difficulty swallowing,  Tooth/dental problems, or  Sore  throat,                No sneezing, itching, ear ache, nasal congestion, post nasal drip,   CV:  No chest pain,  Orthopnea, PND, swelling in lower extremities, anasarca, dizziness, palpitations, syncope.   GI  No heartburn, indigestion, abdominal pain, nausea, vomiting, diarrhea, change in bowel habits, loss of appetite, bloody stools.   Resp:    No chest wall deformity  Skin: no rash or lesions.  GU: no dysuria, change in color of urine, no urgency or frequency.  No flank pain, no hematuria   MS: +right shoulder pain.   No decreased range of motion.  No back pain.  Psych:  No change in mood or affect. No depression or anxiety.  No memory loss.          Objective:   Physical Exam  GEN: A/Ox3; pleasant , NAD   HEENT:  /AT,  EACs-clear, TMs-wnl, NOSE-clear, THROAT-clear, no lesions, no postnasal drip or exudate noted. , poor dentition   NECK:  Supple w/ fair ROM; no JVD; normal carotid impulses w/o bruits; no thyromegaly or nodules palpated; no lymphadenopathy.  RESP  Decreased BS in bases , no accessory muscle use, no dullness to percussion  CHEST : no Chest wall deformity  noted, no palpable axillary nodes.   CARD:  RRR, no m/r/g  , no peripheral edema, pulses intact, no cyanosis or clubbing. Pulses intact bilaterally   GI:   Soft & nt; nml bowel sounds; no organomegaly or masses detected.  Musco: Warm bil, no deformities or joint swelling noted. , nml grips bilaterally , right shoulder mild tenderness, nml rom   Neuro: alert, no focal deficits noted.    Skin: Warm, no lesions or rashes, no redness noted   CXR 02/19/2015 reviewed independently  Stable right upper lobe cavitary mass and adjacent airspace disease. No change.     Assessment & Plan:

## 2015-02-20 NOTE — Progress Notes (Signed)
Reviewed & agree with plan  

## 2015-02-25 ENCOUNTER — Encounter: Payer: Self-pay | Admitting: Adult Health

## 2015-02-25 ENCOUNTER — Ambulatory Visit (INDEPENDENT_AMBULATORY_CARE_PROVIDER_SITE_OTHER): Payer: Medicaid Other | Admitting: Adult Health

## 2015-02-25 ENCOUNTER — Ambulatory Visit (HOSPITAL_COMMUNITY)
Admission: RE | Admit: 2015-02-25 | Discharge: 2015-02-25 | Disposition: A | Discharge status: Home / Self Care | Payer: Medicaid Other | Source: Ambulatory Visit | Attending: Adult Health | Admitting: Adult Health

## 2015-02-25 VITALS — BP 122/88 | HR 94 | Ht 62.0 in | Wt 131.4 lb

## 2015-02-25 DIAGNOSIS — J851 Abscess of lung with pneumonia: Secondary | ICD-10-CM | POA: Diagnosis not present

## 2015-02-25 DIAGNOSIS — M7989 Other specified soft tissue disorders: Secondary | ICD-10-CM | POA: Diagnosis not present

## 2015-02-25 DIAGNOSIS — I82721 Chronic embolism and thrombosis of deep veins of right upper extremity: Secondary | ICD-10-CM | POA: Diagnosis not present

## 2015-02-25 DIAGNOSIS — C3490 Malignant neoplasm of unspecified part of unspecified bronchus or lung: Secondary | ICD-10-CM

## 2015-02-25 DIAGNOSIS — M79601 Pain in right arm: Secondary | ICD-10-CM | POA: Diagnosis present

## 2015-02-25 MED ORDER — RIVAROXABAN (XARELTO) VTE STARTER PACK (15 & 20 MG): ORAL_TABLET | ORAL | Status: DC

## 2015-02-25 MED ORDER — RIVAROXABAN 20 MG PO TABS: 20.0000 mg | ORAL_TABLET | Freq: Every day | ORAL | Status: DC

## 2015-02-25 NOTE — Patient Instructions (Signed)
Begin Xarelto starter pack , Xarelto '15mg'$  Twice daily  For 21 days then '20mg'$  daily  Report any signs of bleeding .  Do not take any NSAIDS- advil , motrin, aleve, etc.  follow up Dr. Elsworth Soho  In 2 months as planned and As needed  Please contact office for sooner follow up if symptoms do not improve or worsen or seek emergency care

## 2015-02-25 NOTE — Progress Notes (Signed)
*  Preliminary Results* Right upper extremity venous duplex completed. 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  Preliminary results discussed with Rexene Edison, NP.  02/25/2015 9:09 AM  Maudry Mayhew, RVT, RDCS, RDMS

## 2015-02-25 NOTE — Progress Notes (Signed)
Subjective:    Patient ID: Barbara Frost, female    DOB: December 31, 1970, 44 y.o.   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 Tx w/ concurrent chemoradiation w/ last dose in 08/20/14 and then 3 cycles of chemo in August. Seen for pulmonary consult for lung abscess during [place] admission 12/31/14 .   TEST  CT chest 12/21/14  Large right upper lobe cavity with thick walls , consolidation in RLL and multiple nodular airspace disease throughout the lungs.   02/25/2015 Follow up : : Lung Abscess/ Lung Cancer Serita Butcher swelling/DVT  Pt returns to discuss venous doppler results of her right arm  Seen last week for post hospital follow up for lung abscess. She has Stage IIIa squamous cell carcinoma, followed by Dr. Julien Nordmann . Was admitted admitted 8/27-01/04/15 for lung abscess.  CT showed a large right upper lobe cavity and PNA . She was tx w/ aggressive IV abx . Discharged on Levaquin and Flagyl.CXR last week  showed stable RUL cavitary mass with small air fluid level.    Follows with Oncology next month . Plans for repeat CT chest in Nov 2016 .   She complained of right arm swelling on/off for last 2 months, worse for last 2 weeks. . Some soreness along upper shoulder.  She had known injury . Swelling seems to come and go.  Previous CT reviewed in August did not show chest wall invasion or distant mets.  No enlarged axillary nodes . No hx of porta cath or PICC. She had frequent PIV for chemo earlier this year.   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 We discussed these results.  Discussed beginning treatment with anticoagulation. She would prefer Xarelto to Coumadin .  We discussed birth control options as she is beginning anticogulation, she has not had menses since chemo.      Review of Systems  Constitutional:   No  weight loss, night sweats,  Fevers, chills,  +fatigue, or  lassitude.  HEENT:   No headaches,   Difficulty swallowing,  Tooth/dental problems, or  Sore throat,                No sneezing, itching, ear ache, nasal congestion, post nasal drip,   CV:  No chest pain,  Orthopnea, PND, swelling in lower extremities, anasarca, dizziness, palpitations, syncope.   GI  No heartburn, indigestion, abdominal pain, nausea, vomiting, diarrhea, change in bowel habits, loss of appetite, bloody stools.   Resp:    No chest wall deformity  Skin: no rash or lesions.  GU: no dysuria, change in color of urine, no urgency or frequency.  No flank pain, no hematuria   MS: +right shoulder pain.   No decreased range of motion.  No back pain.  Psych:  No change in mood or affect. No depression or anxiety.  No memory loss.          Objective:   Physical Exam  GEN: A/Ox3; pleasant , NAD   HEENT:  [place]/AT,  EACs-clear, TMs-wnl, NOSE-clear, THROAT-clear, no lesions, no postnasal drip or exudate noted. , poor dentition   NECK:  Supple w/ fair ROM; no JVD; normal carotid impulses w/o bruits; no thyromegaly or nodules palpated; no lymphadenopathy.  RESP  Decreased BS in bases , no accessory muscle use, no dullness to percussion  CHEST : no Chest wall deformity noted, no palpable axillary nodes.  CARD:  RRR, no m/r/g  , no peripheral edema, pulses intact, no cyanosis or clubbing. Pulses intact bilaterally   GI:   Soft & nt; nml bowel sounds; no organomegaly or masses detected.  Musco: Warm bil, no deformities or joint swelling noted. , nml grips bilaterally , right shoulder mild tenderness, nml rom   Neuro: alert, no focal deficits noted.    Skin: Warm, no lesions or rashes, no redness noted   CXR 02/25/2015 reviewed independently  Stable right upper lobe cavitary mass and adjacent airspace disease. No change.     Assessment & Plan:

## 2015-02-27 ENCOUNTER — Ambulatory Visit (INDEPENDENT_AMBULATORY_CARE_PROVIDER_SITE_OTHER): Payer: Medicaid Other | Admitting: Internal Medicine

## 2015-02-27 ENCOUNTER — Telehealth: Payer: Self-pay | Admitting: Adult Health

## 2015-02-27 ENCOUNTER — Encounter: Payer: Self-pay | Admitting: Internal Medicine

## 2015-02-27 VITALS — BP 132/94 | HR 113 | Ht 62.0 in | Wt 129.0 lb

## 2015-02-27 DIAGNOSIS — I82721 Chronic embolism and thrombosis of deep veins of right upper extremity: Secondary | ICD-10-CM | POA: Diagnosis not present

## 2015-02-27 DIAGNOSIS — I82729 Chronic embolism and thrombosis of deep veins of unspecified upper extremity: Secondary | ICD-10-CM | POA: Insufficient documentation

## 2015-02-27 MED ORDER — HYDROCODONE-ACETAMINOPHEN 5-325 MG PO TABS: ORAL_TABLET | ORAL | Status: DC

## 2015-02-27 MED ORDER — OXYCODONE HCL ER 20 MG PO T12A: 20.0000 mg | EXTENDED_RELEASE_TABLET | Freq: Two times a day (BID) | ORAL | Status: DC

## 2015-02-27 NOTE — Assessment & Plan Note (Signed)
Cont follow up with Oncology .  

## 2015-02-27 NOTE — Progress Notes (Signed)
Subjective:    Patient ID: Barbara Frost, female    DOB: 04-23-1971, 44 y.o.   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 Tx w/ concurrent chemoradiation w/ last dose in 08/20/14 and then 3 cycles of chemo in August 2016 . Seen for pulmonary consult for lung abscess during [place] admission 12/31/14 .   TEST  CT chest 12/21/14  Large right upper lobe cavity with thick walls , consolidation in RLL and multiple nodular airspace disease throughout the lungs.   02/25/2015 Follow up : : Lung Abscess/ Lung Cancer Serita Butcher swelling/DVT  Pt returns to discuss venous doppler results of her right arm  Seen last week for post hospital follow up for lung abscess. She has Stage IIIa squamous cell carcinoma, followed by Dr. Julien Nordmann . Was admitted admitted 8/27-01/04/15 for lung abscess.  CT showed a large right upper lobe cavity and PNA . She was tx w/ aggressive IV abx . Discharged on Levaquin and Flagyl.CXR last week  showed stable RUL cavitary mass with small air fluid level.    Follows with Oncology next month . Plans for repeat CT chest in Nov 2016 .   She complained of right arm swelling on/off for last 2 months, worse for last 2 weeks. . Some soreness along upper shoulder.  She had known injury . Swelling seems to come and go.  Previous CT reviewed in August did not show chest wall invasion or distant mets.  No enlarged axillary nodes . No hx of porta cath or PICC. She had frequent PIV for chemo earlier this year.   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  Discussed beginning treatment with anticoagulation. She would prefer Xarelto to Coumadin .  We discussed birth control options as she is beginning anticogulation, she has not had menses since chemo.   rec Begin Xarelto starter pack , Xarelto '15mg'$  Twice daily  For 21 days then '20mg'$  daily  Report any signs of bleeding .  Do not  take any NSAIDS- advil , motrin, aleve, etc.    02/27/2015 acute extended ov/Wert re: R shoulder pain/ ? Acute vs chronic dvt  Chief Complaint  Patient presents with  . Acute Visit    Pt c/o worsening right arm/shoulder pain. She states pain radiates down her right side. Tylenol not effective.   Pain made worse by any movement of R shoulder/ intermittently swelling esp in am / no problems with numbness or hand grip  No obvious day to day or daytime variability or assoc chronic cough or cp or chest tightness, subjective wheeze or overt sinus or hb symptoms. No unusual exp hx or h/o childhood pna/ asthma or knowledge of premature birth.  Sleeping ok without nocturnal  or early am exacerbation  of respiratory  c/o's or need for noct saba. Also denies any obvious fluctuation of symptoms with weather or environmental changes or other aggravating or alleviating factors except as outlined above   Current Medications, Allergies, Complete Past Medical History, Past Surgical History, Family History, and Social History were reviewed in [employer] record.  ROS  The following are not active complaints unless bolded sore throat, dysphagia, dental problems, itching, sneezing,  nasal congestion or excess/ purulent secretions, ear ache,   fever, chills, sweats, unintended wt loss, classically pleuritic or exertional cp, hemoptysis,  orthopnea pnd or leg swelling, presyncope, palpitations, abdominal  pain, anorexia, nausea, vomiting, diarrhea  or change in bowel or bladder habits, change in stools or urine, dysuria,hematuria,  rash, arthralgias, visual complaints, headache, numbness, weakness or ataxia or problems with walking or coordination,  change in mood/affect or memory.          Objective:   Physical Exam  GEN: A/Ox3; chronically ill appearing amb wf in mild/mod  Discomfort with movement of R shoulder  Wt Readings from Last 3 Encounters:  02/27/15 129 lb (58.514 kg)  02/25/15  131 lb 6.4 oz (59.603 kg)  02/19/15 130 lb 9.6 oz (59.24 kg)    Vital signs reviewed   HEENT:  [place]/AT,  EACs-clear, TMs-wnl, NOSE-clear, THROAT-clear, no lesions, no postnasal drip or exudate noted. , poor dentition   NECK:  Supple w/ fair ROM; no JVD; normal carotid impulses w/o bruits; no thyromegaly or nodules palpated; no lymphadenopathy.  RESP  Decreased BS in bases , no accessory muscle use, no dullness to percussion  CHEST : no Chest wall deformity noted, no palpable axillary nodes.   CARD:  RRR, no m/r/g  , no peripheral edema, pulses intact, no cyanosis or clubbing. Pulses intact bilaterally   GI:   Soft & nt; nml bowel sounds; no organomegaly or masses detected.  Musco: Warm bil, no deformities or joint swelling noted. , nml grips bilaterally , right shoulder mild tenderness and limited to 45 degrees abd, rot flex and ext but no sts or hand / arm swelling at all   Neuro: alert, no focal deficits noted.    Skin: Warm, no lesions or rashes, no redness noted   CXR 02/25/2015 reviewed independently  Stable right upper lobe cavitary mass and adjacent airspace disease. No change.     Assessment & Plan:

## 2015-02-27 NOTE — Telephone Encounter (Signed)
Spoke with the pt's Mother  She states that pt's right arm pain is worse  Last night she could barely sleep due to pain and pain is now radiating down her right side  She states the swelling is no worse, but arm and shoulder are warm to the touch  She has been taking tylenol with no relief  She denies any other new co's Please advise thanks!

## 2015-02-27 NOTE — Assessment & Plan Note (Signed)
Venous doppler showed chronic right UE DVT in brachial vein This is new dx for pt.  She does have underlying cancer  Case discussed with Dr. Elsworth Soho   Pt will begin anticoagulation for at least 3 months   Plan  Begin Xarelto starter pack , Xarelto '15mg'$  Twice daily  For 21 days then '20mg'$  daily  Report any signs of bleeding .  Do not take any NSAIDS- advil , motrin, aleve, etc.  follow up Dr. Elsworth Soho  In 2 months as planned and As needed  Please contact office for sooner follow up if symptoms do not improve or worsen or seek emergency care

## 2015-02-27 NOTE — Telephone Encounter (Signed)
Needs ov for evaluation  See if Dr. Melvyn Novas  Has openings?

## 2015-02-27 NOTE — Assessment & Plan Note (Signed)
Finish Abx as discussed

## 2015-02-27 NOTE — Patient Instructions (Signed)
Start oxycontin 20 mg twice daily about 12 hours apart  For breakthru pain ok to take narco up to 2 every 4 hours if needed  I will send Dr Earlie Server your note today with ? Do you need a different anticoagulant in view of your clot and cancer diagnosis and he may be in touch with you to change this but for now rec: no change in xarelto dosing

## 2015-02-27 NOTE — Telephone Encounter (Signed)
Spoke with pt's mother. Pt has been scheduled to see MW today at 3pm. Nothing further was needed.

## 2015-02-27 NOTE — Progress Notes (Signed)
Reviewed & agree with plan  

## 2015-02-28 ENCOUNTER — Encounter: Payer: Self-pay | Admitting: Internal Medicine

## 2015-02-28 NOTE — Assessment & Plan Note (Signed)
It is not entirely clear that the pain and restricted motion of the R shoulder have anything to do with the blood clot because she doesn't really have any soft tissue swelling or edema in the right arm. Further, I'm not sure that treating her with Xarelto is going to work if this is a paraneoplastic problem where Lovenox would probably be the better choice. I will ask Dr. Julien Nordmann for his opinion in the meantime treat her pain with OxyContin 20 mg twice a day with when necessary number Vicodin and consider orthopedic evaluation/ defer to Dr Earlie Server with plans to f/u with him next  I had an extended discussion with the patient reviewing all relevant studies completed to date and  lasting 15 to 20 minutes of a 25 minute visit    Each maintenance medication was reviewed in detail including most importantly the difference between maintenance and prns and under what circumstances the prns are to be triggered using an action plan format that is not reflected in the computer generated alphabetically organized AVS.    Please see instructions for details which were reviewed in writing and the patient given a copy highlighting the part that I personally wrote and discussed at today's ov.

## 2015-03-25 ENCOUNTER — Other Ambulatory Visit (HOSPITAL_BASED_OUTPATIENT_CLINIC_OR_DEPARTMENT_OTHER): Payer: Medicaid Other

## 2015-03-25 ENCOUNTER — Ambulatory Visit (HOSPITAL_COMMUNITY): Payer: MEDICAID

## 2015-03-25 DIAGNOSIS — C3411 Malignant neoplasm of upper lobe, right bronchus or lung: Secondary | ICD-10-CM | POA: Diagnosis present

## 2015-03-25 DIAGNOSIS — D509 Iron deficiency anemia, unspecified: Secondary | ICD-10-CM

## 2015-03-25 DIAGNOSIS — C3491 Malignant neoplasm of unspecified part of right bronchus or lung: Secondary | ICD-10-CM

## 2015-03-25 LAB — COMPREHENSIVE METABOLIC PANEL (CC13)
ALT: 97 U/L — AB (ref 0–55)
AST: 71 U/L — AB (ref 5–34)
Albumin: 3.2 g/dL — ABNORMAL LOW (ref 3.5–5.0)
Alkaline Phosphatase: 108 U/L (ref 40–150)
Anion Gap: 8 mEq/L (ref 3–11)
BUN: 14.9 mg/dL (ref 7.0–26.0)
CHLORIDE: 107 meq/L (ref 98–109)
CO2: 23 meq/L (ref 22–29)
CREATININE: 0.6 mg/dL (ref 0.6–1.1)
Calcium: 9.4 mg/dL (ref 8.4–10.4)
EGFR: >90 mL/min/{1.73_m2} (ref >90)
Glucose: 97 mg/dl (ref 70–140)
POTASSIUM: 3.8 meq/L (ref 3.5–5.1)
Sodium: 139 mEq/L (ref 136–145)
Total Bilirubin: <0.3 mg/dL (ref 0.20–1.20)
Total Protein: 8.2 g/dL (ref 6.4–8.3)

## 2015-03-25 LAB — CBC WITH DIFFERENTIAL/PLATELET
BASO%: 0.4 % (ref 0.0–2.0)
BASOS ABS: 0 10*3/uL (ref 0.0–0.1)
EOS%: 1.8 % (ref 0.0–7.0)
Eosinophils Absolute: 0.1 10*3/uL (ref 0.0–0.5)
HCT: 40.1 % (ref 34.8–46.6)
HEMOGLOBIN: 13.2 g/dL (ref 11.6–15.9)
LYMPH%: 19.2 % (ref 14.0–49.7)
MCH: 29.4 pg (ref 25.1–34.0)
MCHC: 33 g/dL (ref 31.5–36.0)
MCV: 89.2 fL (ref 79.5–101.0)
MONO#: 0.6 10*3/uL (ref 0.1–0.9)
MONO%: 8.1 % (ref 0.0–14.0)
NEUT#: 4.9 10*3/uL (ref 1.5–6.5)
NEUT%: 70.5 % (ref 38.4–76.8)
Platelets: 301 10*3/uL (ref 145–400)
RBC: 4.5 10*6/uL (ref 3.70–5.45)
RDW: 16 % — AB (ref 11.2–14.5)
WBC: 7 10*3/uL (ref 3.9–10.3)
lymph#: 1.3 10*3/uL (ref 0.9–3.3)

## 2015-03-29 ENCOUNTER — Encounter (HOSPITAL_COMMUNITY): Payer: Self-pay

## 2015-03-29 ENCOUNTER — Ambulatory Visit (HOSPITAL_COMMUNITY)
Admission: RE | Admit: 2015-03-29 | Discharge: 2015-03-29 | Disposition: A | Discharge status: Home / Self Care | Payer: Medicaid Other | Source: Ambulatory Visit | Attending: Internal Medicine | Admitting: Internal Medicine

## 2015-03-29 DIAGNOSIS — I313 Pericardial effusion (noninflammatory): Secondary | ICD-10-CM | POA: Diagnosis not present

## 2015-03-29 DIAGNOSIS — D509 Iron deficiency anemia, unspecified: Secondary | ICD-10-CM

## 2015-03-29 DIAGNOSIS — Z923 Personal history of irradiation: Secondary | ICD-10-CM | POA: Insufficient documentation

## 2015-03-29 DIAGNOSIS — C3491 Malignant neoplasm of unspecified part of right bronchus or lung: Secondary | ICD-10-CM | POA: Insufficient documentation

## 2015-03-29 MED ORDER — IOHEXOL 300 MG/ML  SOLN
75.0000 mL | Freq: Once | INTRAMUSCULAR | Status: AC | PRN
  Administered 2015-03-29: 75 mL via INTRAVENOUS

## 2015-04-01 ENCOUNTER — Encounter: Payer: Self-pay | Admitting: Internal Medicine

## 2015-04-01 ENCOUNTER — Telehealth: Payer: Self-pay | Admitting: Internal Medicine

## 2015-04-01 ENCOUNTER — Ambulatory Visit (HOSPITAL_BASED_OUTPATIENT_CLINIC_OR_DEPARTMENT_OTHER): Payer: Medicaid Other | Admitting: Internal Medicine

## 2015-04-01 VITALS — BP 137/79 | HR 91 | Temp 98.7°F | Resp 18 | Ht 62.0 in | Wt 121.1 lb

## 2015-04-01 DIAGNOSIS — I82621 Acute embolism and thrombosis of deep veins of right upper extremity: Secondary | ICD-10-CM | POA: Diagnosis not present

## 2015-04-01 DIAGNOSIS — C3411 Malignant neoplasm of upper lobe, right bronchus or lung: Secondary | ICD-10-CM | POA: Diagnosis not present

## 2015-04-01 DIAGNOSIS — C3491 Malignant neoplasm of unspecified part of right bronchus or lung: Secondary | ICD-10-CM

## 2015-04-01 DIAGNOSIS — R079 Chest pain, unspecified: Secondary | ICD-10-CM

## 2015-04-01 MED ORDER — OXYCODONE HCL ER 20 MG PO T12A: 20.0000 mg | EXTENDED_RELEASE_TABLET | Freq: Two times a day (BID) | ORAL | Status: DC

## 2015-04-01 MED ORDER — HYDROCODONE-ACETAMINOPHEN 5-325 MG PO TABS: ORAL_TABLET | ORAL | Status: DC

## 2015-04-01 NOTE — Progress Notes (Signed)
[place] [phone]   Fax:(336) [place], MD [place] [address]  DIAGNOSIS:  1) Locally advanced questionable for a stage IIIA (T3, N2, M0) non-small cell lung cancer, squamous cell carcinoma diagnosed in February 2016 presented with large right upper lobe lung mass with mediastinal invasion. 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 Melvyn Novas.  PRIOR THERAPY:  1) Concurrent chemoradiation with weekly carboplatin for AUC of 2 and paclitaxel 45 MG/M2. First dose 07/16/2014. Status post 6 cycles of chemotherapy last dose was given 08/20/2014 with partial response. 2) Consolidation chemotherapy with carboplatin for AUC of 5 on day 1 and gemcitabine 1000 MG/M2 on days 1 and 8 every 3 weeks. First dose 10/08/2014. Status post 3 cycles.  CURRENT THERAPY: Observation.  INTERVAL HISTORY: Barbara Frost 44 y.o. female returns to the clinic today for hospital follow-up visit accompanied by her mother. The patient is doing fine today with no specific complaints except for the persistent right-sided chest pain with no significant shortness breath, cough or hemoptysis. She is currently on OxyContin and Vicodin. She denied having any significant weight loss or night sweats. She has no significant nausea or vomiting, no fever or chills. She had repeat CT scan of the chest performed recently and she is here for evaluation and discussion of her scan results.  MEDICAL HISTORY: Past Medical History  Diagnosis Date  . S/P radiation therapy 07/10/2014 through 08/20/2014     Right lung/mediastinum 6000 cGy in 30 sessions   . Squamous cell carcinoma of right lung ([facility]) 06/29/14    ALLERGIES:  is allergic to keflex.  MEDICATIONS:  Current Outpatient Prescriptions    Medication Sig Dispense Refill  . albuterol (PROVENTIL HFA;VENTOLIN HFA) 108 (90 BASE) MCG/ACT inhaler Inhale 1-2 puffs into the lungs every 6 (six) hours as needed for wheezing or shortness of breath. Use 2 puffs 3 times daily x 4 days, then every 6 hours as needed. 1 Inhaler 0  . benzonatate (TESSALON) 100 MG capsule Take 1 capsule (100 mg total) by mouth 3 (three) times daily. 20 capsule 0  . DiphenhydrAMINE HCl (BENADRYL ALLERGY PO) Take 1 tablet by mouth daily as needed.    . docusate sodium (COLACE) 100 MG capsule Take 1 capsule (100 mg total) by mouth 2 (two) times daily. (Patient taking differently: Take 100 mg by mouth daily. ) 10 capsule 0  . ferrous sulfate 325 (65 FE) MG tablet Take 1 tablet (325 mg total) by mouth 2 (two) times daily with a meal. 60 tablet 3  . HYDROcodone-acetaminophen (NORCO) 5-325 MG tablet 1-2 up to every 4 hours as needed 60 tablet 0  . HYDROcodone-homatropine (HYCODAN) 5-1.5 MG/5ML syrup TAKE 1 TEASPOONFUL BY MOUTH EVERY 4 HOURS AS NEEDED FOR COUGH  0  . levofloxacin (LEVAQUIN) 500 MG tablet TAKE 1 TABLET (500 MG TOTAL) BY MOUTH DAILY.  0  . metroNIDAZOLE (FLAGYL) 500 MG tablet TAKE 1 TABLET (500 MG TOTAL) BY MOUTH 3 (THREE) TIMES DAILY.  0  . Multiple Vitamins-Minerals (ADULT GUMMY) CHEW Chew 1 capsule by mouth daily.    . OxyCODONE (OXYCONTIN) 20 mg T12A 12 hr tablet Take 1 tablet (20 mg total) by mouth every 12 (twelve) hours. 60 tablet 0  . OXYGEN Inhale 2 L into the lungs at bedtime.     . prochlorperazine (COMPAZINE) 10 MG tablet Take  1 tablet (10 mg total) by mouth every 6 (six) hours as needed for nausea or vomiting. 30 tablet 0  . Rivaroxaban (XARELTO STARTER PACK) 15 & 20 MG TBPK Take as directed: Start with one '15mg'$  tablet by mouth twice a day with food. On Day 22, switch to one '20mg'$  tablet once a day with food. 51 each 0  . rivaroxaban (XARELTO) 20 MG TABS tablet Take 1 tablet (20 mg total) by mouth daily with supper. Begin on day 22 30 tablet 2   No  current facility-administered medications for this visit.    SURGICAL HISTORY:  Past Surgical History  Procedure Laterality Date  . Femur closed reduction Right     REVIEW OF SYSTEMS:  A comprehensive review of systems was negative except for: Respiratory: positive for pleurisy/chest pain   PHYSICAL EXAMINATION: General appearance: alert, cooperative, fatigued and no distress Head: Normocephalic, without obvious abnormality, atraumatic Neck: no adenopathy, no JVD, supple, symmetrical, trachea midline and thyroid not enlarged, symmetric, no tenderness/mass/nodules Lymph nodes: Cervical, supraclavicular, and axillary nodes normal. Resp: diminished breath sounds RUL and dullness to percussion RUL Back: symmetric, no curvature. ROM normal. No CVA tenderness. Cardio: regular rate and rhythm, S1, S2 normal, no murmur, click, rub or gallop GI: soft, non-tender; bowel sounds normal; no masses,  no organomegaly Extremities: extremities normal, atraumatic, no cyanosis or edema Neurologic: Alert and oriented X 3, normal strength and tone. Normal symmetric reflexes. Normal coordination and gait  ECOG PERFORMANCE STATUS: 1 - Symptomatic but completely ambulatory  Blood pressure 137/79, pulse 91, temperature 98.7 F (37.1 C), temperature source Oral, resp. rate 18, height '5\' 2"'$  (1.575 m), weight 121 lb 1.6 oz (54.931 kg), last menstrual period 03/18/2015, SpO2 100 %.  LABORATORY DATA: Lab Results  Component Value Date   WBC 7.0 03/25/2015   HGB 13.2 03/25/2015   HCT 40.1 03/25/2015   MCV 89.2 03/25/2015   PLT 301 03/25/2015      Chemistry      Component Value Date/Time   NA 139 03/25/2015 0801   NA 133* 01/03/2015 0410   K 3.8 03/25/2015 0801   K 4.5 01/03/2015 0410   CL 95* 01/03/2015 0410   CO2 23 03/25/2015 0801   CO2 31 01/03/2015 0410   BUN 14.9 03/25/2015 0801   BUN 7 01/03/2015 0410   CREATININE 0.6 03/25/2015 0801   CREATININE 0.65 01/03/2015 0410      Component Value  Date/Time   CALCIUM 9.4 03/25/2015 0801   CALCIUM 8.8* 01/03/2015 0410   ALKPHOS 108 03/25/2015 0801   ALKPHOS 86 01/03/2015 0410   AST 71* 03/25/2015 0801   AST 25 01/03/2015 0410   ALT 97* 03/25/2015 0801   ALT 15 01/03/2015 0410   BILITOT <0.30 03/25/2015 0801   BILITOT 0.6 01/03/2015 0410       RADIOGRAPHIC STUDIES: Ct Chest W Contrast  03/29/2015  CLINICAL DATA:  Subsequent encounter for non-small-cell lung cancer. EXAM: CT CHEST WITH CONTRAST TECHNIQUE: Multidetector CT imaging of the chest was performed during intravenous contrast administration. CONTRAST:  46m OMNIPAQUE IOHEXOL 300 MG/ML  SOLN COMPARISON:  12/21/2014. FINDINGS: Mediastinum / Lymph Nodes: Small axillary lymph nodes bilaterally are stable. The mm short axis right hilar lymph node measured previously is stable at 11 mm. No left hilar lymphadenopathy. Small pericardial effusion is stable. The esophagus has normal imaging features. Lungs / Pleura: Previous eat described large cavitary lesion in the right apex persists. The irregular wall thickening associated with this cavitary lesion has decreased  in the interval although walls do remain thick and somewhat irregular today. Communication of a right upper lobe bronchus to the cavitary lesion is evident on image 18 series 5 and image 43 series 602. There is some interstitial and air space consolidation along the inferior margin of the lesion, as before. The right middle lobe left upper lobe, and bilateral lower lobe ill-defined nodularity seen on the previous study in a peribronchovascular distribution has resolved in the interval. Changes of emphysema are underlying. Upper Abdomen: No focal abnormality is seen within the visualized portions of the liver or spleen. No adrenal nodule or mass. MSK / Soft Tissues: Bone windows reveal no worrisome lytic or sclerotic osseous lesions. IMPRESSION: 1. The walls of the large cavitary lesion in the right upper lobe are slightly decreased  in thickness and irregularity in the interval without a substantial interval change in size of this lesion. Direct communication of the cavitary component of the lesion to a right upper lobe airway is evident. 2. Interval resolution of the diffuse peribronchovascular nodularity seen in both lungs previously, consistent with interval resolution of infectious/inflammatory process. 3. Confluent airspace consolidation along the inferior aspect of the right apical lesion likely secondary to radiation therapy. 4. Stable small pericardial effusion. Electronically Signed   By: Misty Stanley M.D.   On: 03/29/2015 08:57    ASSESSMENT AND PLAN: This is a very pleasant 44 years old white female recently diagnosed with at least a stage IIIa (T3, N2, M0) non-small cell lung cancer, squamous cell carcinoma presented with large right upper lobe mass with questionable mediastinal invasion diagnosed in February 2016. The patient underwent a course of concurrent chemoradiation with weekly carboplatin and paclitaxel and tolerated her treatment fairly well.  This was followed by a course of concurrent chemoradiation with carboplatin and gemcitabine and the patient tolerated her treatment well. The patient CT scan of the chest showed no evidence for disease progression. I discussed the scan results and showed the images to the patient and her mother. I recommended for her to continue on observation for now with repeat CT scan of the chest in 3 months. For pain management, she will continue on OxyContin 20 mg mg by mouth every 12 hours in addition to Vicodin for breakthrough pain. She was advised to call immediately if she has any concerning symptoms in the interval. The patient voices understanding of current disease status and treatment options and is in agreement with the current care plan.  All questions were answered. The patient knows to call the clinic with any problems, questions or concerns. We can certainly see the  patient much sooner if necessary.  Disclaimer: This note was dictated with voice recognition software. Similar sounding words can inadvertently be transcribed and may not be corrected upon review.

## 2015-04-01 NOTE — Telephone Encounter (Signed)
per pof to sch pt appt-gave pt copy of avs-adv [facility] Scheduling will call to sch scan

## 2015-04-30 ENCOUNTER — Other Ambulatory Visit: Payer: Self-pay

## 2015-04-30 ENCOUNTER — Telehealth: Payer: Self-pay | Admitting: *Deleted

## 2015-04-30 ENCOUNTER — Ambulatory Visit: Payer: Medicaid Other | Admitting: Adult Health

## 2015-04-30 DIAGNOSIS — C3491 Malignant neoplasm of unspecified part of right bronchus or lung: Secondary | ICD-10-CM

## 2015-04-30 DIAGNOSIS — C3490 Malignant neoplasm of unspecified part of unspecified bronchus or lung: Secondary | ICD-10-CM

## 2015-04-30 MED ORDER — OXYCODONE HCL ER 20 MG PO T12A: 20.0000 mg | EXTENDED_RELEASE_TABLET | Freq: Two times a day (BID) | ORAL | Status: DC

## 2015-04-30 MED ORDER — HYDROCODONE-ACETAMINOPHEN 5-325 MG PO TABS: ORAL_TABLET | ORAL | Status: DC

## 2015-04-30 NOTE — Addendum Note (Signed)
Addended by: Lucile Crater on: 04/30/2015 04:48 PM   Modules accepted: Orders

## 2015-04-30 NOTE — Telephone Encounter (Signed)
Reviewed with MD ok to refill pain meds. Pt will need to contact PCP for Albuterol refills. Pt notified.

## 2015-04-30 NOTE — Telephone Encounter (Signed)
Patient needs a refill for NORCO, OXYCONTIN, and ALBUTEROL medication. Message sent to RN [facility]

## 2015-05-15 ENCOUNTER — Encounter: Payer: Self-pay | Admitting: Pulmonary Disease

## 2015-05-15 ENCOUNTER — Ambulatory Visit (INDEPENDENT_AMBULATORY_CARE_PROVIDER_SITE_OTHER): Payer: Medicaid Other | Admitting: Pulmonary Disease

## 2015-05-15 VITALS — BP 100/70 | HR 97 | Ht 60.0 in | Wt 115.2 lb

## 2015-05-15 DIAGNOSIS — J851 Abscess of lung with pneumonia: Secondary | ICD-10-CM | POA: Diagnosis not present

## 2015-05-15 DIAGNOSIS — I82721 Chronic embolism and thrombosis of deep veins of right upper extremity: Secondary | ICD-10-CM | POA: Diagnosis not present

## 2015-05-15 NOTE — Patient Instructions (Signed)
FU in 19month CT scan in Feb 2017

## 2015-05-15 NOTE — Assessment & Plan Note (Signed)
-   resolved Cavitary changes s/p chemoRT for cancer Monitor , CT planned in feb 2017

## 2015-05-15 NOTE — Progress Notes (Signed)
   Subjective:    Patient ID: Barbara Frost, female    DOB: May 05, 1970, 45 y.o.   MRN: [id number]  HPI  45 yo former smoker with Dx Stage IIIa squamous cell carcinoma in Feb 2016 . Tx w/ concurrent chemoradiation w/ last dose in 08/20/14 and then 3 cycles of chemo in August 2016 . Seen for pulmonary consult for lung abscess during [facility] admission 12/31/14 .   Significant tests/ events  CT chest 12/21/14  Large right upper lobe cavity with thick walls , consolidation in RLL and multiple nodular airspace disease throughout the lungs.    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 Follow-up    recheck DVT on right arm; Small cell carcinoma; breathing doing well; no concerns.   Feels well No cough Breathing ok Wt stable Tolerating xarelto well, compliant, no swelling RUE   Review of Systems neg for any significant sore throat, dysphagia, itching, sneezing, nasal congestion or excess/ purulent secretions, fever, chills, sweats, unintended wt loss, pleuritic or exertional cp, hempoptysis, orthopnea pnd or change in chronic leg swelling. Also denies presyncope, palpitations, heartburn, abdominal pain, nausea, vomiting, diarrhea or change in bowel or urinary habits, dysuria,hematuria, rash, arthralgias, visual complaints, headache, numbness weakness or ataxia.     Objective:   Physical Exam  Gen. Pleasant, thin woman, in no distress ENT - no lesions, no post nasal drip Neck: No JVD, no thyromegaly, no carotid bruits Lungs: no use of accessory muscles, no dullness to percussion, clear without rales or rhonchi  Cardiovascular: Rhythm regular, heart sounds  normal, no murmurs or gallops, no peripheral edema Musculoskeletal: No deformities, no cyanosis or clubbing        Assessment & Plan:

## 2015-05-15 NOTE — Assessment & Plan Note (Signed)
Ct xarelto ? lifelong

## 2015-05-30 ENCOUNTER — Telehealth: Payer: Self-pay | Admitting: *Deleted

## 2015-05-30 DIAGNOSIS — C3491 Malignant neoplasm of unspecified part of right bronchus or lung: Secondary | ICD-10-CM

## 2015-05-30 MED ORDER — OXYCODONE HCL ER 20 MG PO T12A: 20.0000 mg | EXTENDED_RELEASE_TABLET | Freq: Two times a day (BID) | ORAL | Status: DC

## 2015-05-30 MED ORDER — HYDROCODONE-ACETAMINOPHEN 5-325 MG PO TABS: ORAL_TABLET | ORAL | Status: DC

## 2015-05-30 NOTE — Telephone Encounter (Signed)
Pt called request for pain meds. Reviewed with MD ok to refill. Instructed pt to pick up

## 2015-06-28 ENCOUNTER — Telehealth: Payer: Self-pay | Admitting: *Deleted

## 2015-06-28 ENCOUNTER — Other Ambulatory Visit: Payer: Self-pay | Admitting: Medical Oncology

## 2015-06-28 DIAGNOSIS — C3491 Malignant neoplasm of unspecified part of right bronchus or lung: Secondary | ICD-10-CM

## 2015-06-28 MED ORDER — HYDROCODONE-ACETAMINOPHEN 5-325 MG PO TABS: ORAL_TABLET | ORAL | Status: DC

## 2015-06-28 MED ORDER — OXYCODONE HCL ER 20 MG PO T12A: 20.0000 mg | EXTENDED_RELEASE_TABLET | Freq: Two times a day (BID) | ORAL | Status: DC

## 2015-06-28 NOTE — Telephone Encounter (Signed)
Spoke with patient.  She would like refills of both her oxycodone and hydrocodone.  She will pick up the scripts on Monday 07/01/15.  Her call back is [phone]

## 2015-06-28 NOTE — Progress Notes (Signed)
rx locked up for pick up-pt notified.

## 2015-07-01 ENCOUNTER — Ambulatory Visit (HOSPITAL_COMMUNITY): Payer: Medicaid Other

## 2015-07-02 ENCOUNTER — Other Ambulatory Visit (HOSPITAL_BASED_OUTPATIENT_CLINIC_OR_DEPARTMENT_OTHER): Payer: Medicaid Other

## 2015-07-02 DIAGNOSIS — C3411 Malignant neoplasm of upper lobe, right bronchus or lung: Secondary | ICD-10-CM

## 2015-07-02 DIAGNOSIS — C3491 Malignant neoplasm of unspecified part of right bronchus or lung: Secondary | ICD-10-CM

## 2015-07-02 LAB — CBC WITH DIFFERENTIAL/PLATELET
BASO%: 0.2 % (ref 0.0–2.0)
BASOS ABS: 0 10*3/uL (ref 0.0–0.1)
EOS ABS: 0 10*3/uL (ref 0.0–0.5)
EOS%: 0.7 % (ref 0.0–7.0)
HCT: 41.1 % (ref 34.8–46.6)
HGB: 13.5 g/dL (ref 11.6–15.9)
LYMPH#: 0.4 10*3/uL — AB (ref 0.9–3.3)
LYMPH%: 6.7 % — AB (ref 14.0–49.7)
MCH: 30.5 pg (ref 25.1–34.0)
MCHC: 32.8 g/dL (ref 31.5–36.0)
MCV: 93 fL (ref 79.5–101.0)
MONO#: 0.6 10*3/uL (ref 0.1–0.9)
MONO%: 10.4 % (ref 0.0–14.0)
NEUT%: 82 % — AB (ref 38.4–76.8)
NEUTROS ABS: 4.6 10*3/uL (ref 1.5–6.5)
PLATELETS: 202 10*3/uL (ref 145–400)
RBC: 4.42 10*6/uL (ref 3.70–5.45)
RDW: 14.2 % (ref 11.2–14.5)
WBC: 5.7 10*3/uL (ref 3.9–10.3)

## 2015-07-02 LAB — COMPREHENSIVE METABOLIC PANEL
ALT: 49 U/L (ref 0–55)
ANION GAP: 9 meq/L (ref 3–11)
AST: 33 U/L (ref 5–34)
Albumin: 3.2 g/dL — ABNORMAL LOW (ref 3.5–5.0)
Alkaline Phosphatase: 100 U/L (ref 40–150)
BILIRUBIN TOTAL: 0.39 mg/dL (ref 0.20–1.20)
BUN: 13.8 mg/dL (ref 7.0–26.0)
CHLORIDE: 104 meq/L (ref 98–109)
CO2: 25 meq/L (ref 22–29)
Calcium: 8.8 mg/dL (ref 8.4–10.4)
Creatinine: 0.7 mg/dL (ref 0.6–1.1)
GLUCOSE: 82 mg/dL (ref 70–140)
POTASSIUM: 4.2 meq/L (ref 3.5–5.1)
SODIUM: 138 meq/L (ref 136–145)
Total Protein: 7.6 g/dL (ref 6.4–8.3)

## 2015-07-09 ENCOUNTER — Ambulatory Visit: Payer: Medicaid Other | Admitting: Internal Medicine

## 2015-07-10 ENCOUNTER — Ambulatory Visit (HOSPITAL_COMMUNITY): Payer: Medicaid Other

## 2015-07-10 ENCOUNTER — Telehealth: Payer: Self-pay | Admitting: Internal Medicine

## 2015-07-10 NOTE — Telephone Encounter (Signed)
pt called to r/s appt...done....pt ok and aware of d.t

## 2015-07-30 ENCOUNTER — Telehealth: Payer: Self-pay | Admitting: Internal Medicine

## 2015-07-30 ENCOUNTER — Ambulatory Visit (HOSPITAL_BASED_OUTPATIENT_CLINIC_OR_DEPARTMENT_OTHER): Payer: Medicaid Other | Admitting: Internal Medicine

## 2015-07-30 ENCOUNTER — Encounter: Payer: Self-pay | Admitting: Internal Medicine

## 2015-07-30 VITALS — BP 108/76 | HR 78 | Temp 97.6°F | Resp 18 | Ht 60.0 in | Wt 111.0 lb

## 2015-07-30 DIAGNOSIS — C3411 Malignant neoplasm of upper lobe, right bronchus or lung: Secondary | ICD-10-CM | POA: Diagnosis present

## 2015-07-30 DIAGNOSIS — R05 Cough: Secondary | ICD-10-CM | POA: Diagnosis not present

## 2015-07-30 DIAGNOSIS — R0602 Shortness of breath: Secondary | ICD-10-CM

## 2015-07-30 DIAGNOSIS — R079 Chest pain, unspecified: Secondary | ICD-10-CM

## 2015-07-30 DIAGNOSIS — C3491 Malignant neoplasm of unspecified part of right bronchus or lung: Secondary | ICD-10-CM

## 2015-07-30 MED ORDER — HYDROCODONE-ACETAMINOPHEN 5-325 MG PO TABS: ORAL_TABLET | ORAL | Status: DC

## 2015-07-30 MED ORDER — OXYCODONE HCL ER 20 MG PO T12A: 20.0000 mg | EXTENDED_RELEASE_TABLET | Freq: Two times a day (BID) | ORAL | Status: DC

## 2015-07-30 NOTE — Progress Notes (Signed)
[facility] Telephone:([phone]   Fax:(336) [place], [place] [address]  DIAGNOSIS:  1) Locally advanced questionable for a stage IIIA (T3, N2, M0) non-small cell lung cancer, squamous cell carcinoma diagnosed in February 2016 presented with large right upper lobe lung mass with mediastinal invasion. 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 Melvyn Novas.  PRIOR THERAPY:  1) Concurrent chemoradiation with weekly carboplatin for AUC of 2 and paclitaxel 45 MG/M2. First dose 07/16/2014. Status post 6 cycles of chemotherapy last dose was given 08/20/2014 with partial response. 2) Consolidation chemotherapy with carboplatin for AUC of 5 on day 1 and gemcitabine 1000 MG/M2 on days 1 and 8 every 3 weeks. First dose 10/08/2014. Status post 3 cycles.  CURRENT THERAPY: Observation.  INTERVAL HISTORY: Barbara Frost 45 y.o. female returns to the clinic today for follow-up visit. The patient is doing fine today with no specific complaints except for worsening right-sided chest pain with mild shortness breath and cough with no hemoptysis. She is currently on OxyContin and Vicodin. She is requesting refill of her medication. She denied having any significant weight loss or night sweats. She has no significant nausea or vomiting, no fever or chills. She was supposed to have repeat CT scan of the chest before her visit for restaging of her disease but this was declined by her insurance.  MEDICAL HISTORY: Past Medical History  Diagnosis Date  . S/P radiation therapy 07/10/2014 through 08/20/2014     Right lung/mediastinum 6000 cGy in 30 sessions   . Squamous cell carcinoma of right lung ([facility]) 06/29/14    ALLERGIES:  is allergic to keflex.  MEDICATIONS:  Current Outpatient  Prescriptions  Medication Sig Dispense Refill  . albuterol (PROVENTIL HFA;VENTOLIN HFA) 108 (90 BASE) MCG/ACT inhaler Inhale 1-2 puffs into the lungs every 6 (six) hours as needed for wheezing or shortness of breath. Use 2 puffs 3 times daily x 4 days, then every 6 hours as needed. 1 Inhaler 0  . HYDROcodone-acetaminophen (NORCO) 5-325 MG tablet 1-2 up to every 4 hours as needed 60 tablet 0  . oxyCODONE (OXYCONTIN) 20 mg 12 hr tablet Take 1 tablet (20 mg total) by mouth every 12 (twelve) hours. 60 tablet 0  . rivaroxaban (XARELTO) 20 MG TABS tablet Take 1 tablet (20 mg total) by mouth daily with supper. Begin on day 22 30 tablet 2   No current facility-administered medications for this visit.    SURGICAL HISTORY:  Past Surgical History  Procedure Laterality Date  . Femur closed reduction Right     REVIEW OF SYSTEMS:  A comprehensive review of systems was negative except for: Respiratory: positive for cough, dyspnea on exertion and pleurisy/chest pain   PHYSICAL EXAMINATION: General appearance: alert, cooperative, fatigued and no distress Head: Normocephalic, without obvious abnormality, atraumatic Neck: no adenopathy, no JVD, supple, symmetrical, trachea midline and thyroid not enlarged, symmetric, no tenderness/mass/nodules Lymph nodes: Cervical, supraclavicular, and axillary nodes normal. Resp: diminished breath sounds RUL and dullness to percussion RUL Back: symmetric, no curvature. ROM normal. No CVA tenderness. Cardio: regular rate and rhythm, S1, S2 normal, no murmur, click, rub or gallop GI: soft, non-tender; bowel sounds normal; no masses,  no organomegaly Extremities: extremities normal, atraumatic, no cyanosis or edema Neurologic: Alert and oriented X 3, normal strength and tone. Normal symmetric reflexes. Normal coordination and gait  ECOG PERFORMANCE STATUS: 1 - Symptomatic but completely ambulatory  Blood pressure 108/76, pulse 78, temperature 97.6 F (36.4 C),  temperature source Oral, resp. rate 18, height 5' (1.524 m), weight 111 lb (50.349 kg), SpO2 100 %.  LABORATORY DATA: Lab Results  Component Value Date   WBC 5.7 07/02/2015   HGB 13.5 07/02/2015   HCT 41.1 07/02/2015   MCV 93.0 07/02/2015   PLT 202 07/02/2015      Chemistry      Component Value Date/Time   NA 138 07/02/2015 0805   NA 133* 01/03/2015 0410   K 4.2 07/02/2015 0805   K 4.5 01/03/2015 0410   CL 95* 01/03/2015 0410   CO2 25 07/02/2015 0805   CO2 31 01/03/2015 0410   BUN 13.8 07/02/2015 0805   BUN 7 01/03/2015 0410   CREATININE 0.7 07/02/2015 0805   CREATININE 0.65 01/03/2015 0410      Component Value Date/Time   CALCIUM 8.8 07/02/2015 0805   CALCIUM 8.8* 01/03/2015 0410   ALKPHOS 100 07/02/2015 0805   ALKPHOS 86 01/03/2015 0410   AST 33 07/02/2015 0805   AST 25 01/03/2015 0410   ALT 49 07/02/2015 0805   ALT 15 01/03/2015 0410   BILITOT 0.39 07/02/2015 0805   BILITOT 0.6 01/03/2015 0410       RADIOGRAPHIC STUDIES: No results found.  ASSESSMENT AND PLAN: This is a very pleasant 45 years old white female recently diagnosed with at least a stage IIIa (T3, N2, M0) non-small cell lung cancer, squamous cell carcinoma presented with large right upper lobe mass with questionable mediastinal invasion diagnosed in February 2016. The patient underwent a course of concurrent chemoradiation with weekly carboplatin and paclitaxel and tolerated her treatment fairly well.  This was followed by a course of concurrent chemoradiation with carboplatin and gemcitabine and the patient tolerated her treatment well. She is currently on observation and the patient presented today with worsening right-sided chest pain as well as shortness of breath and cough. I will arrange for the patient to have repeat CT scan of the chest in the next few days for evaluation of her right-sided chest pain as well as shortness of breath. For pain management, she will continue on OxyContin 20 mg mg  by mouth every 12 hours in addition to Vicodin for breakthrough pain. I will see her back for follow-up visit in less than 2 weeks for reevaluation and discussion of her scan results. She was advised to call immediately if she has any concerning symptoms in the interval. The patient voices understanding of current disease status and treatment options and is in agreement with the current care plan.  All questions were answered. The patient knows to call the clinic with any problems, questions or concerns. We can certainly see the patient much sooner if necessary.  Disclaimer: This note was dictated with voice recognition software. Similar sounding words can inadvertently be transcribed and may not be corrected upon review.

## 2015-07-30 NOTE — Telephone Encounter (Signed)
per pof to sch pt appt-gave pt copy of avs-adv [facility] all to sch scan

## 2015-08-02 ENCOUNTER — Ambulatory Visit (HOSPITAL_COMMUNITY)
Admission: RE | Admit: 2015-08-02 | Discharge: 2015-08-02 | Disposition: A | Discharge status: Home / Self Care | Payer: Medicaid Other | Source: Ambulatory Visit | Attending: Internal Medicine | Admitting: Internal Medicine

## 2015-08-02 DIAGNOSIS — J984 Other disorders of lung: Secondary | ICD-10-CM | POA: Diagnosis not present

## 2015-08-02 DIAGNOSIS — C3491 Malignant neoplasm of unspecified part of right bronchus or lung: Secondary | ICD-10-CM | POA: Insufficient documentation

## 2015-08-02 DIAGNOSIS — Z923 Personal history of irradiation: Secondary | ICD-10-CM | POA: Diagnosis not present

## 2015-08-02 MED ORDER — IOPAMIDOL (ISOVUE-300) INJECTION 61%
75.0000 mL | Freq: Once | INTRAVENOUS | Status: AC | PRN
  Administered 2015-08-02: 75 mL via INTRAVENOUS

## 2015-08-09 ENCOUNTER — Encounter: Payer: Self-pay | Admitting: Internal Medicine

## 2015-08-09 ENCOUNTER — Telehealth: Payer: Self-pay | Admitting: Internal Medicine

## 2015-08-09 ENCOUNTER — Ambulatory Visit (HOSPITAL_BASED_OUTPATIENT_CLINIC_OR_DEPARTMENT_OTHER): Payer: Medicaid Other | Admitting: Internal Medicine

## 2015-08-09 VITALS — BP 129/88 | HR 84 | Temp 98.4°F | Resp 18 | Ht 60.0 in | Wt 110.4 lb

## 2015-08-09 DIAGNOSIS — R079 Chest pain, unspecified: Secondary | ICD-10-CM | POA: Diagnosis not present

## 2015-08-09 DIAGNOSIS — C3411 Malignant neoplasm of upper lobe, right bronchus or lung: Secondary | ICD-10-CM

## 2015-08-09 DIAGNOSIS — C3491 Malignant neoplasm of unspecified part of right bronchus or lung: Secondary | ICD-10-CM

## 2015-08-09 DIAGNOSIS — I82621 Acute embolism and thrombosis of deep veins of right upper extremity: Secondary | ICD-10-CM | POA: Diagnosis not present

## 2015-08-09 NOTE — Progress Notes (Signed)
[facility] Telephone:([phone]   Fax:(336) [place], MD [place] [address]  DIAGNOSIS:  1) Locally advanced questionable for a stage IIIA (T3, N2, M0) non-small cell lung cancer, squamous cell carcinoma diagnosed in February 2016 presented with large right upper lobe lung mass with mediastinal invasion. 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 Melvyn Novas.  PRIOR THERAPY:  1) Concurrent chemoradiation with weekly carboplatin for AUC of 2 and paclitaxel 45 MG/M2. First dose 07/16/2014. Status post 6 cycles of chemotherapy last dose was given 08/20/2014 with partial response. 2) Consolidation chemotherapy with carboplatin for AUC of 5 on day 1 and gemcitabine 1000 MG/M2 on days 1 and 8 every 3 weeks. First dose 10/08/2014. Status post 3 cycles.  CURRENT THERAPY: Observation.  INTERVAL HISTORY: Barbara Frost 45 y.o. female returns to the clinic today for follow-up visit accompanied by a family member, Barbara Frost. The patient is doing fine today with no specific complaints except for tightness in the right side of the chest with mild shortness breath and cough with no hemoptysis. She is currently on OxyContin and Vicodin for pain management. She denied having any significant weight loss or night sweats. She has no significant nausea or vomiting, no fever or chills. She had repeat CT scan of the chest performed recently for evaluation of the right chest pain and she is here for discussion of her scan results.  MEDICAL HISTORY: Past Medical History  Diagnosis Date  . S/P radiation therapy 07/10/2014 through 08/20/2014     Right lung/mediastinum 6000 cGy in 30 sessions   . Squamous cell carcinoma of right lung ([place]) 06/29/14    ALLERGIES:  is allergic to  keflex.  MEDICATIONS:  Current Outpatient Prescriptions  Medication Sig Dispense Refill  . albuterol (PROVENTIL HFA;VENTOLIN HFA) 108 (90 BASE) MCG/ACT inhaler Inhale 1-2 puffs into the lungs every 6 (six) hours as needed for wheezing or shortness of breath. Use 2 puffs 3 times daily x 4 days, then every 6 hours as needed. 1 Inhaler 0  . HYDROcodone-acetaminophen (NORCO) 5-325 MG tablet 1-2 up to every 4 hours as needed 30 tablet 0  . oxyCODONE (OXYCONTIN) 20 mg 12 hr tablet Take 1 tablet (20 mg total) by mouth every 12 (twelve) hours. 20 tablet 0  . rivaroxaban (XARELTO) 20 MG TABS tablet Take 1 tablet (20 mg total) by mouth daily with supper. Begin on day 22 30 tablet 2   No current facility-administered medications for this visit.    SURGICAL HISTORY:  Past Surgical History  Procedure Laterality Date  . Femur closed reduction Right     REVIEW OF SYSTEMS:  A comprehensive review of systems was negative except for: Respiratory: positive for cough, dyspnea on exertion and pleurisy/chest pain   PHYSICAL EXAMINATION: General appearance: alert, cooperative, fatigued and no distress Head: Normocephalic, without obvious abnormality, atraumatic Neck: no adenopathy, no JVD, supple, symmetrical, trachea midline and thyroid not enlarged, symmetric, no tenderness/mass/nodules Lymph nodes: Cervical, supraclavicular, and axillary nodes normal. Resp: diminished breath sounds RUL and dullness to percussion RUL Back: symmetric, no curvature. ROM normal. No CVA tenderness. Cardio: regular rate and rhythm, S1, S2 normal, no murmur, click, rub or gallop GI: soft, non-tender; bowel sounds normal; no masses,  no organomegaly Extremities: extremities normal, atraumatic, no cyanosis or edema Neurologic: Alert and oriented X 3, normal strength and tone.  Normal symmetric reflexes. Normal coordination and gait  ECOG PERFORMANCE STATUS: 1 - Symptomatic but completely ambulatory  Blood pressure 129/88, pulse  84, temperature 98.4 F (36.9 C), temperature source Oral, resp. rate 18, height 5' (1.524 m), weight 110 lb 6.4 oz (50.077 kg), SpO2 100 %.  LABORATORY DATA: Lab Results  Component Value Date   WBC 5.7 07/02/2015   HGB 13.5 07/02/2015   HCT 41.1 07/02/2015   MCV 93.0 07/02/2015   PLT 202 07/02/2015      Chemistry      Component Value Date/Time   NA 138 07/02/2015 0805   NA 133* 01/03/2015 0410   K 4.2 07/02/2015 0805   K 4.5 01/03/2015 0410   CL 95* 01/03/2015 0410   CO2 25 07/02/2015 0805   CO2 31 01/03/2015 0410   BUN 13.8 07/02/2015 0805   BUN 7 01/03/2015 0410   CREATININE 0.7 07/02/2015 0805   CREATININE 0.65 01/03/2015 0410      Component Value Date/Time   CALCIUM 8.8 07/02/2015 0805   CALCIUM 8.8* 01/03/2015 0410   ALKPHOS 100 07/02/2015 0805   ALKPHOS 86 01/03/2015 0410   AST 33 07/02/2015 0805   AST 25 01/03/2015 0410   ALT 49 07/02/2015 0805   ALT 15 01/03/2015 0410   BILITOT 0.39 07/02/2015 0805   BILITOT 0.6 01/03/2015 0410       RADIOGRAPHIC STUDIES: Ct Chest W Contrast  08/02/2015  CLINICAL DATA:  Restaging lung cancer. Non small cell lung cancer stage III status post radiation therapy and chemotherapy. Cough and weight loss. EXAM: CT CHEST WITH CONTRAST TECHNIQUE: Multidetector CT imaging of the chest was performed during intravenous contrast administration. CONTRAST:  11m ISOVUE-300 IOPAMIDOL (ISOVUE-300) INJECTION 61% COMPARISON:  03/29/2015 FINDINGS: Mediastinum/Nodes: No chest wall mass, breast mass, supraclavicular or axillary lymphadenopathy. Small scattered lymph nodes appears stable. The thyroid gland appears normal. The heart is normal in size. No pericardial effusion. Stable mild fusiform enlargement of the ascending aorta. No dissection. The branch vessels are patent. Matted soft tissue density in the mediastinal and right hilar region consistent with treated disease. I do not see any discrete measurable lymphadenopathy. Ill-defined soft tissue  density between the right mainstem bronchus and the right upper lobe bronchus measures 14 mm and previously measured 11 mm. The esophagus is grossly normal. Lungs/Pleura: Large right upper lobe cavitary lesion is unchanged. A rim of soft tissue thickening appears relatively stable. Stable low dense radiation changes in the right paramediastinal. No free-flowing right pleural effusion. Stable advanced emphysematous changes. No metastatic pulmonary nodules are identified. Stable 4 mm nodule along the right major fissure most consistent with a lymph node. Upper abdomen: No upper abdominal metastatic disease is identified. Musculoskeletal: No findings for osseous metastatic disease. IMPRESSION: Overall stable CT appearance of the chest since November 2016. No findings for progressive tumor, adenopathy or pulmonary or upper abdominal metastatic disease. Stable large right upper lobe cavitary lesion and radiation changes in the right lung. Electronically Signed   By: PMarijo SanesM.D.   On: 08/02/2015 12:39    ASSESSMENT AND PLAN: This is a very pleasant 45years old white female recently diagnosed with at least a stage IIIA (T3, N2, M0) non-small cell lung cancer, squamous cell carcinoma presented with large right upper lobe mass with questionable mediastinal invasion diagnosed in February 2016. The patient underwent a course of concurrent chemoradiation with weekly carboplatin and paclitaxel and tolerated her treatment fairly well.  This was followed by a course of concurrent chemoradiation  with carboplatin and gemcitabine and the patient tolerated her treatment well. She is currently on observation and the patient presented today with worsening right-sided chest pain as well as shortness of breath and cough. The recent CT scan of the chest showed no significant evidence for disease progression. I discussed the scan results with the patient today. I recommended for her to continue on observation with repeat CT  scan of the chest in 3 months. For pain management, she will continue on OxyContin 20 mg mg by mouth every 12 hours in addition to Vicodin for breakthrough pain. She was advised to call immediately if she has any concerning symptoms in the interval. The patient voices understanding of current disease status and treatment options and is in agreement with the current care plan.  All questions were answered. The patient knows to call the clinic with any problems, questions or concerns. We can certainly see the patient much sooner if necessary.  Disclaimer: This note was dictated with voice recognition software. Similar sounding words can inadvertently be transcribed and may not be corrected upon review.

## 2015-08-09 NOTE — Telephone Encounter (Signed)
Gave and printed appt sched and avs for pt for June and July  °

## 2015-08-30 ENCOUNTER — Other Ambulatory Visit: Payer: Self-pay | Admitting: Medical Oncology

## 2015-08-30 DIAGNOSIS — C3491 Malignant neoplasm of unspecified part of right bronchus or lung: Secondary | ICD-10-CM

## 2015-08-30 MED ORDER — HYDROCODONE-ACETAMINOPHEN 5-325 MG PO TABS: ORAL_TABLET | ORAL | Status: DC

## 2015-08-30 MED ORDER — OXYCODONE HCL ER 20 MG PO T12A: 20.0000 mg | EXTENDED_RELEASE_TABLET | Freq: Two times a day (BID) | ORAL | Status: DC

## 2015-08-30 NOTE — Progress Notes (Signed)
2 narcotic rx locked up and pt notified to pick up.

## 2015-09-23 ENCOUNTER — Other Ambulatory Visit: Payer: Self-pay | Admitting: *Deleted

## 2015-09-23 DIAGNOSIS — C3491 Malignant neoplasm of unspecified part of right bronchus or lung: Secondary | ICD-10-CM

## 2015-09-23 MED ORDER — HYDROCODONE-ACETAMINOPHEN 5-325 MG PO TABS: ORAL_TABLET | ORAL | Status: DC

## 2015-09-23 MED ORDER — OXYCODONE HCL ER 20 MG PO T12A: 20.0000 mg | EXTENDED_RELEASE_TABLET | Freq: Two times a day (BID) | ORAL | Status: DC

## 2015-09-23 NOTE — Telephone Encounter (Signed)
Pt called [facility] with request for pain meds. Notified pt refill on pain meds ready to pick up

## 2015-10-31 ENCOUNTER — Other Ambulatory Visit: Payer: Self-pay | Admitting: Medical Oncology

## 2015-10-31 DIAGNOSIS — C3491 Malignant neoplasm of unspecified part of right bronchus or lung: Secondary | ICD-10-CM

## 2015-10-31 MED ORDER — OXYCODONE HCL ER 20 MG PO T12A: 20.0000 mg | EXTENDED_RELEASE_TABLET | Freq: Two times a day (BID) | ORAL | Status: DC

## 2015-11-01 ENCOUNTER — Ambulatory Visit (HOSPITAL_COMMUNITY): Payer: Medicaid Other

## 2015-11-01 ENCOUNTER — Other Ambulatory Visit (HOSPITAL_BASED_OUTPATIENT_CLINIC_OR_DEPARTMENT_OTHER): Payer: Medicaid Other

## 2015-11-01 DIAGNOSIS — C3491 Malignant neoplasm of unspecified part of right bronchus or lung: Secondary | ICD-10-CM

## 2015-11-01 DIAGNOSIS — C3411 Malignant neoplasm of upper lobe, right bronchus or lung: Secondary | ICD-10-CM | POA: Diagnosis present

## 2015-11-01 LAB — COMPREHENSIVE METABOLIC PANEL
ALBUMIN: 3.5 g/dL (ref 3.5–5.0)
ALK PHOS: 79 U/L (ref 40–150)
ALT: 39 U/L (ref 0–55)
AST: 34 U/L (ref 5–34)
Anion Gap: 10 mEq/L (ref 3–11)
BILIRUBIN TOTAL: 0.61 mg/dL (ref 0.20–1.20)
BUN: 9.3 mg/dL (ref 7.0–26.0)
CALCIUM: 9.1 mg/dL (ref 8.4–10.4)
CO2: 25 mEq/L (ref 22–29)
CREATININE: 0.8 mg/dL (ref 0.6–1.1)
Chloride: 108 mEq/L (ref 98–109)
EGFR: >90 mL/min/{1.73_m2} (ref >90)
GLUCOSE: 87 mg/dL (ref 70–140)
Potassium: 3.3 mEq/L — ABNORMAL LOW (ref 3.5–5.1)
SODIUM: 143 meq/L (ref 136–145)
TOTAL PROTEIN: 7.5 g/dL (ref 6.4–8.3)

## 2015-11-01 LAB — CBC WITH DIFFERENTIAL/PLATELET
BASO%: 0.4 % (ref 0.0–2.0)
Basophils Absolute: 0 10*3/uL (ref 0.0–0.1)
EOS%: 0.5 % (ref 0.0–7.0)
Eosinophils Absolute: 0 10*3/uL (ref 0.0–0.5)
HEMATOCRIT: 41.4 % (ref 34.8–46.6)
HEMOGLOBIN: 14.2 g/dL (ref 11.6–15.9)
LYMPH#: 2 10*3/uL (ref 0.9–3.3)
LYMPH%: 25.1 % (ref 14.0–49.7)
MCH: 32.4 pg (ref 25.1–34.0)
MCHC: 34.4 g/dL (ref 31.5–36.0)
MCV: 94.2 fL (ref 79.5–101.0)
MONO#: 0.8 10*3/uL (ref 0.1–0.9)
MONO%: 9.8 % (ref 0.0–14.0)
NEUT%: 64.2 % (ref 38.4–76.8)
NEUTROS ABS: 5.1 10*3/uL (ref 1.5–6.5)
Platelets: 221 10*3/uL (ref 145–400)
RBC: 4.39 10*6/uL (ref 3.70–5.45)
RDW: 13.7 % (ref 11.2–14.5)
WBC: 8 10*3/uL (ref 3.9–10.3)

## 2015-11-08 ENCOUNTER — Telehealth: Payer: Self-pay | Admitting: Internal Medicine

## 2015-11-08 NOTE — Telephone Encounter (Signed)
S/w pt, advised appt chg from 7/12 to 8/1 @ 10.45am due to md pal. Pt verbalized understanding.

## 2015-11-13 ENCOUNTER — Ambulatory Visit (INDEPENDENT_AMBULATORY_CARE_PROVIDER_SITE_OTHER): Payer: Medicaid Other | Admitting: Adult Health

## 2015-11-13 ENCOUNTER — Ambulatory Visit: Payer: Medicaid Other | Admitting: Internal Medicine

## 2015-11-13 ENCOUNTER — Other Ambulatory Visit: Payer: Self-pay | Admitting: Medical Oncology

## 2015-11-13 ENCOUNTER — Encounter: Payer: Self-pay | Admitting: Adult Health

## 2015-11-13 VITALS — BP 134/82 | HR 78 | Temp 97.7°F | Ht 60.0 in | Wt 112.0 lb

## 2015-11-13 DIAGNOSIS — C3490 Malignant neoplasm of unspecified part of unspecified bronchus or lung: Secondary | ICD-10-CM | POA: Diagnosis not present

## 2015-11-13 DIAGNOSIS — I82721 Chronic embolism and thrombosis of deep veins of right upper extremity: Secondary | ICD-10-CM

## 2015-11-13 DIAGNOSIS — C3491 Malignant neoplasm of unspecified part of right bronchus or lung: Secondary | ICD-10-CM

## 2015-11-13 DIAGNOSIS — J851 Abscess of lung with pneumonia: Secondary | ICD-10-CM | POA: Diagnosis not present

## 2015-11-13 MED ORDER — ALBUTEROL SULFATE HFA 108 (90 BASE) MCG/ACT IN AERS: 1.0000 | INHALATION_SPRAY | Freq: Four times a day (QID) | RESPIRATORY_TRACT | Status: AC | PRN

## 2015-11-13 MED ORDER — RIVAROXABAN 20 MG PO TABS: 20.0000 mg | ORAL_TABLET | Freq: Every day | ORAL | Status: AC

## 2015-11-13 MED ORDER — HYDROCODONE-ACETAMINOPHEN 5-325 MG PO TABS: ORAL_TABLET | ORAL | Status: DC

## 2015-11-13 NOTE — Assessment & Plan Note (Signed)
Cont follow up and serial CT chest planned on 8/1 /17 and with Dr. Julien Nordmann.

## 2015-11-13 NOTE — Assessment & Plan Note (Signed)
Stable chronic changes.  Follow CT chest planned 12/03/15.

## 2015-11-13 NOTE — Addendum Note (Signed)
Addended by: Osa Craver on: 11/13/2015 10:38 AM   Modules accepted: Orders

## 2015-11-13 NOTE — Patient Instructions (Signed)
Keep on xarelto Follow up for CT chest in August Refill your albuterol  Please follow up with Dentist  Follow up with Dr. Elsworth Soho in 6 months and As needed

## 2015-11-13 NOTE — Assessment & Plan Note (Signed)
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  Refills x 6 month sent to pharm

## 2015-11-13 NOTE — Progress Notes (Signed)
Subjective:    Patient ID: Barbara Frost, female    DOB: February 02, 1971, 45 y.o.   MRN: [id number]  HPI 45 yo former smoker with Dx Stage IIIa  squamous cell carcinoma in Feb  2016 . Tx w/ concurrent chemoradiation w/ last dose in 08/20/14 and then 3 cycles of chemo in August. Seen for pulmonary consult for lung abscess during [facility] admission 12/31/14 .   TEST  CT chest 12/21/14  Large right upper lobe cavity with thick walls , consolidation in RLL and multiple nodular airspace disease throughout the lungs.   11/13/2015 Follow up : : Lung Abscess/ Lung Cancer  /R Arm DVT  Pt returns for 6 month follow up .  RA pt here for a 6 month follow up. Pt c/o chest tightness, dry cough, sinus drainage, SOB and wheezing at times but overall breathing is doing okay.  Denies any fever, chest or leg pain, nausea or vomiting.   She has been treated for Stage IIIa squamous cell carcinoma, followed by Dr. Julien Nordmann beginning in 08/2014.  . Was admitted admitted 8/27-01/04/15 for lung abscess.  CT showed a large right upper lobe cavity and PNA . She was tx w/ aggressive IV abx . Discharged on Levaquin and Flagyl. Serial, CT chest in November 2016 showed a decrease in the large cavitary lesion in the right upper lobe. There was interval resolution of nodularity in both lungs. A confluent airspace consolidation along the inferior aspect of the right apical lesion felt secondary to radiation therapy. CT chest in March 2017 showed overall stable CT appearance since 2016. There was no findings for progressive tumor adenopathy or metastatic disease. Stable large right upper lobe cavitary lesion and radiation changes in the right lung. She has a follow-up CT chest planned on 12/03/15.  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 Oct  2016. She was started on Xarelto. This is presumed to be lifelong.  Uses albuterol inhaler occcasionally .    Past Medical History  Diagnosis Date  . S/P radiation therapy  07/10/2014 through 08/20/2014     Right lung/mediastinum 6000 cGy in 30 sessions   . Squamous cell carcinoma of right lung ([facility]) 06/29/14   Current Outpatient Prescriptions on File Prior to Visit  Medication Sig Dispense Refill  . albuterol (PROVENTIL HFA;VENTOLIN HFA) 108 (90 BASE) MCG/ACT inhaler Inhale 1-2 puffs into the lungs every 6 (six) hours as needed for wheezing or shortness of breath. Use 2 puffs 3 times daily x 4 days, then every 6 hours as needed. 1 Inhaler 0  . HYDROcodone-acetaminophen (NORCO) 5-325 MG tablet 1-2 up to every 4 hours as needed 30 tablet 0  . oxyCODONE (OXYCONTIN) 20 mg 12 hr tablet Take 1 tablet (20 mg total) by mouth every 12 (twelve) hours. 20 tablet 0  . rivaroxaban (XARELTO) 20 MG TABS tablet Take 1 tablet (20 mg total) by mouth daily with supper. Begin on day 22 30 tablet 2   No current facility-administered medications on file prior to visit.       Review of Systems  Constitutional:   No  weight loss, night sweats,  Fevers, chills, fatigue, or  lassitude.  HEENT:   No headaches,  Difficulty swallowing,  Tooth/dental problems, or  Sore throat,                No sneezing, itching, ear ache, nasal congestion, post nasal drip,   CV:  No chest pain,  Orthopnea, PND, swelling in  lower extremities, anasarca, dizziness, palpitations, syncope.   GI  No heartburn, indigestion, abdominal pain, nausea, vomiting, diarrhea, change in bowel habits, loss of appetite, bloody stools.   Resp:    No chest wall deformity  Skin: no rash or lesions.  GU: no dysuria, change in color of urine, no urgency or frequency.  No flank pain, no hematuria   MS:    No decreased range of motion.  No back pain.  Psych:  No change in mood or affect. No depression or anxiety.  No memory loss.          Objective:   Physical Exam  Filed Vitals:   11/13/15 0942  BP: 134/82  Pulse: 78  Temp: 97.7 F (36.5  C)  TempSrc: Oral  Height: 5' (1.524 m)  Weight: 112 lb (50.803 kg)  SpO2: 98%    GEN: A/Ox3; pleasant , NAD   HEENT:  [place]/AT,  EACs-clear, TMs-wnl, NOSE-clear, THROAT-clear, no lesions, no postnasal drip or exudate noted. , poor dentition   NECK:  Supple w/ fair ROM; no JVD; normal carotid impulses w/o bruits; no thyromegaly or nodules palpated; no lymphadenopathy.  RESP  Decreased BS in bases , no accessory muscle use, no dullness to percussion  CHEST : no Chest wall deformity noted, no palpable axillary nodes.   CARD:  RRR, no m/r/g  , no peripheral edema, pulses intact, no cyanosis or clubbing. Pulses intact bilaterally   GI:   Soft & nt; nml bowel sounds; no organomegaly or masses detected.  Musco: Warm bil, no deformities  , nml grips bilaterally ,    Neuro: alert, no focal deficits noted.    Skin: Warm, no lesions or rashes, no redness noted      Saleh Ulbrich NP-C  [facility]  11/13/2015

## 2015-12-03 ENCOUNTER — Ambulatory Visit (HOSPITAL_BASED_OUTPATIENT_CLINIC_OR_DEPARTMENT_OTHER): Payer: Medicaid Other | Admitting: Internal Medicine

## 2015-12-03 ENCOUNTER — Telehealth: Payer: Self-pay | Admitting: Internal Medicine

## 2015-12-03 ENCOUNTER — Encounter: Payer: Self-pay | Admitting: Internal Medicine

## 2015-12-03 DIAGNOSIS — I82401 Acute embolism and thrombosis of unspecified deep veins of right lower extremity: Secondary | ICD-10-CM

## 2015-12-03 DIAGNOSIS — C3411 Malignant neoplasm of upper lobe, right bronchus or lung: Secondary | ICD-10-CM

## 2015-12-03 DIAGNOSIS — C3491 Malignant neoplasm of unspecified part of right bronchus or lung: Secondary | ICD-10-CM

## 2015-12-03 MED ORDER — OXYCODONE HCL ER 20 MG PO T12A: 20.0000 mg | EXTENDED_RELEASE_TABLET | Freq: Two times a day (BID) | ORAL | 0 refills | Status: AC

## 2015-12-03 MED ORDER — HYDROCODONE-ACETAMINOPHEN 5-325 MG PO TABS: ORAL_TABLET | ORAL | 0 refills | Status: AC

## 2015-12-03 NOTE — Telephone Encounter (Signed)
per pof to sch pt appt-pt to get updated copy b4 leaving trmt

## 2015-12-03 NOTE — Progress Notes (Signed)
[facility] Telephone:(336) [phone]   Fax:(336) [place], MD [place] [address]  DIAGNOSIS:  1) Locally advanced questionable for a stage IIIA (T3, N2, M0) non-small cell lung cancer, squamous cell carcinoma diagnosed in February 2016 presented with large right upper lobe lung mass with mediastinal invasion. 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 Melvyn Novas.  PRIOR THERAPY:  1) Concurrent chemoradiation with weekly carboplatin for AUC of 2 and paclitaxel 45 MG/M2. First dose 07/16/2014. Status post 6 cycles of chemotherapy last dose was given 08/20/2014 with partial response. 2) Consolidation chemotherapy with carboplatin for AUC of 5 on day 1 and gemcitabine 1000 MG/M2 on days 1 and 8 every 3 weeks. First dose 10/08/2014. Status post 3 cycles.  CURRENT THERAPY: Observation.  INTERVAL HISTORY: Barbara Frost 45 y.o. female returns to the clinic today for follow-up visit accompanied by her mother. The patient is doing fine today with no specific complaints except for tightness in the right side of the chest with mild shortness of breath. She also has mild cough with no hemoptysis. She is currently on OxyContin and Norco for pain management and she is requesting refill of her medication. She denied having any significant weight loss or night sweats. She has no significant nausea or vomiting, no fever or chills. She was supposed to have repeat CT scan of the chest for restaging of her disease before this visit but this was declined by her insurance.  MEDICAL HISTORY: Past Medical History:  Diagnosis Date  . S/P radiation therapy 07/10/2014 through 08/20/2014    Right lung/mediastinum 6000 cGy in 30 sessions   . Squamous cell carcinoma of right lung ([place]) 06/29/14     ALLERGIES:  is allergic to keflex [cephalexin].  MEDICATIONS:  Current Outpatient Prescriptions  Medication Sig Dispense Refill  . albuterol (PROVENTIL HFA;VENTOLIN HFA) 108 (90 Base) MCG/ACT inhaler Inhale 1-2 puffs into the lungs every 6 (six) hours as needed for wheezing or shortness of breath. Use 2 puffs 3 times daily x 4 days, then every 6 hours as needed. 1 Inhaler 5  . HYDROcodone-acetaminophen (NORCO) 5-325 MG tablet 1-2 up to every 4 hours as needed 30 tablet 0  . oxyCODONE (OXYCONTIN) 20 mg 12 hr tablet Take 1 tablet (20 mg total) by mouth every 12 (twelve) hours. 20 tablet 0  . rivaroxaban (XARELTO) 20 MG TABS tablet Take 1 tablet (20 mg total) by mouth daily with supper. Begin on day 22 30 tablet 5   No current facility-administered medications for this visit.     SURGICAL HISTORY:  Past Surgical History:  Procedure Laterality Date  . FEMUR CLOSED REDUCTION Right     REVIEW OF SYSTEMS:  A comprehensive review of systems was negative except for: Respiratory: positive for cough, dyspnea on exertion and pleurisy/chest pain   PHYSICAL EXAMINATION: General appearance: alert, cooperative, fatigued and no distress Head: Normocephalic, without obvious abnormality, atraumatic Neck: no adenopathy, no JVD, supple, symmetrical, trachea midline and thyroid not enlarged, symmetric, no tenderness/mass/nodules Lymph nodes: Cervical, supraclavicular, and axillary nodes normal. Resp: diminished breath sounds RUL and dullness to percussion RUL Back: symmetric, no curvature. ROM normal. No CVA tenderness. Cardio: regular rate and rhythm, S1, S2 normal, no murmur, click, rub or gallop GI: soft, non-tender; bowel sounds normal; no masses,  no organomegaly Extremities: extremities normal, atraumatic, no cyanosis or edema  Neurologic: Alert and oriented X 3, normal strength and tone. Normal symmetric reflexes. Normal coordination and gait  ECOG PERFORMANCE STATUS: 1 - Symptomatic but  completely ambulatory  Blood pressure 104/69, pulse 81, temperature 98 F (36.7 C), temperature source Oral, resp. rate 18, weight 109 lb 9.6 oz (49.7 kg), SpO2 100 %.  LABORATORY DATA: Lab Results  Component Value Date   WBC 8.0 11/01/2015   HGB 14.2 11/01/2015   HCT 41.4 11/01/2015   MCV 94.2 11/01/2015   PLT 221 11/01/2015      Chemistry      Component Value Date/Time   NA 143 11/01/2015 1344   K 3.3 (L) 11/01/2015 1344   CL 95 (L) 01/03/2015 0410   CO2 25 11/01/2015 1344   BUN 9.3 11/01/2015 1344   CREATININE 0.8 11/01/2015 1344      Component Value Date/Time   CALCIUM 9.1 11/01/2015 1344   ALKPHOS 79 11/01/2015 1344   AST 34 11/01/2015 1344   ALT 39 11/01/2015 1344   BILITOT 0.61 11/01/2015 1344       RADIOGRAPHIC STUDIES: No results found.  ASSESSMENT AND PLAN: This is a very pleasant 45 years old white female recently diagnosed with at least a stage IIIA (T3, N2, M0) non-small cell lung cancer, squamous cell carcinoma presented with large right upper lobe mass with questionable mediastinal invasion diagnosed in February 2016. The patient underwent a course of concurrent chemoradiation with weekly carboplatin and paclitaxel and tolerated her treatment fairly well.  This was followed by a course of concurrent chemoradiation with carboplatin and gemcitabine and the patient tolerated her treatment well. She is currently on observation with stable condition.  I recommended for her to continue on observation with repeat CT scan of the chest in 3 months. For pain management, she will continue on OxyContin 20 mg mg by mouth every 12 hours in addition to [place] for breakthrough pain. She was given a refill of her medication today. She was advised to call immediately if she has any concerning symptoms in the interval. The patient voices understanding of current disease status and treatment options and is in agreement with the current care plan.  All questions were answered.  The patient knows to call the clinic with any problems, questions or concerns. We can certainly see the patient much sooner if necessary.  Disclaimer: This note was dictated with voice recognition software. Similar sounding words can inadvertently be transcribed and may not be corrected upon review.

## 2016-01-21 ENCOUNTER — Telehealth: Payer: Self-pay | Admitting: *Deleted

## 2016-01-21 NOTE — Telephone Encounter (Signed)
On 01-21-16 fax medical records [place] [facility] [place] [facility] follow up note, sim & planning note, end of tx note, consult note.

## 2016-03-02 ENCOUNTER — Other Ambulatory Visit (HOSPITAL_BASED_OUTPATIENT_CLINIC_OR_DEPARTMENT_OTHER): Payer: Medicaid Other

## 2016-03-02 ENCOUNTER — Ambulatory Visit (HOSPITAL_COMMUNITY)
Admission: RE | Admit: 2016-03-02 | Discharge: 2016-03-02 | Disposition: A | Discharge status: Home / Self Care | Payer: Medicaid Other | Source: Ambulatory Visit | Attending: Internal Medicine | Admitting: Internal Medicine

## 2016-03-02 ENCOUNTER — Encounter (HOSPITAL_COMMUNITY): Payer: Self-pay

## 2016-03-02 DIAGNOSIS — C3411 Malignant neoplasm of upper lobe, right bronchus or lung: Secondary | ICD-10-CM | POA: Diagnosis not present

## 2016-03-02 DIAGNOSIS — J984 Other disorders of lung: Secondary | ICD-10-CM | POA: Insufficient documentation

## 2016-03-02 DIAGNOSIS — J439 Emphysema, unspecified: Secondary | ICD-10-CM | POA: Insufficient documentation

## 2016-03-02 DIAGNOSIS — C3491 Malignant neoplasm of unspecified part of right bronchus or lung: Secondary | ICD-10-CM | POA: Insufficient documentation

## 2016-03-02 LAB — CBC WITH DIFFERENTIAL/PLATELET
BASO%: 0.4 % (ref 0.0–2.0)
Basophils Absolute: 0 10*3/uL (ref 0.0–0.1)
EOS%: 1.9 % (ref 0.0–7.0)
Eosinophils Absolute: 0.1 10*3/uL (ref 0.0–0.5)
HEMATOCRIT: 44.4 % (ref 34.8–46.6)
HEMOGLOBIN: 14.8 g/dL (ref 11.6–15.9)
LYMPH#: 1.6 10*3/uL (ref 0.9–3.3)
LYMPH%: 21 % (ref 14.0–49.7)
MCH: 32 pg (ref 25.1–34.0)
MCHC: 33.2 g/dL (ref 31.5–36.0)
MCV: 96.3 fL (ref 79.5–101.0)
MONO#: 0.8 10*3/uL (ref 0.1–0.9)
MONO%: 10.2 % (ref 0.0–14.0)
NEUT%: 66.5 % (ref 38.4–76.8)
NEUTROS ABS: 5.1 10*3/uL (ref 1.5–6.5)
Platelets: 281 10*3/uL (ref 145–400)
RBC: 4.62 10*6/uL (ref 3.70–5.45)
RDW: 13.2 % (ref 11.2–14.5)
WBC: 7.7 10*3/uL (ref 3.9–10.3)

## 2016-03-02 LAB — COMPREHENSIVE METABOLIC PANEL
ALBUMIN: 3.5 g/dL (ref 3.5–5.0)
ALK PHOS: 90 U/L (ref 40–150)
ALT: 56 U/L — AB (ref 0–55)
AST: 43 U/L — AB (ref 5–34)
Anion Gap: 10 mEq/L (ref 3–11)
BUN: 12.5 mg/dL (ref 7.0–26.0)
CALCIUM: 9.2 mg/dL (ref 8.4–10.4)
CHLORIDE: 105 meq/L (ref 98–109)
CO2: 27 mEq/L (ref 22–29)
CREATININE: 0.8 mg/dL (ref 0.6–1.1)
EGFR: >90 mL/min/{1.73_m2} (ref >90)
GLUCOSE: 83 mg/dL (ref 70–140)
POTASSIUM: 4.3 meq/L (ref 3.5–5.1)
SODIUM: 142 meq/L (ref 136–145)
Total Bilirubin: 0.57 mg/dL (ref 0.20–1.20)
Total Protein: 8 g/dL (ref 6.4–8.3)

## 2016-03-02 MED ORDER — IOPAMIDOL (ISOVUE-300) INJECTION 61%
75.0000 mL | Freq: Once | INTRAVENOUS | Status: AC | PRN
  Administered 2016-03-02: 75 mL via INTRAVENOUS

## 2016-03-09 ENCOUNTER — Ambulatory Visit: Payer: Medicaid Other | Admitting: Internal Medicine

## 2016-04-29 ENCOUNTER — Other Ambulatory Visit: Payer: Self-pay | Admitting: Nurse Practitioner

## 2016-09-14 IMAGING — PT NM PET TUM IMG INITIAL (PI) SKULL BASE T - THIGH
8 series · 25 of 25 positions shown · non-contrast
Comparison: None.

CLINICAL DATA: INITIAL treatment strategy for RIGHT UPPER LOBE LUNG
MASS.

EXAM:
NUCLEAR MEDICINE PET SKULL BASE TO THIGH
TECHNIQUE: 6.1 mCi F-18 FDG was injected intravenously. Full-ring PET imaging
was performed from the skull base to thigh after the radiotracer. CT
data was obtained and used for attenuation correction and anatomic
localization.
FASTING BLOOD GLUCOSE:  Value: 115 mg/dl

[Series 3: pet sk_thigh ac · axial · 5.0mm · 4.07mm/px · z∈[-1407,-539]mm · 5 of 218 slices shown]
[im 1/218]
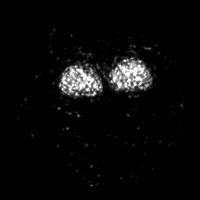
[im 55/218]
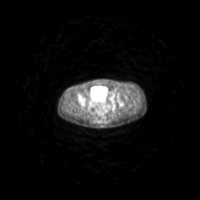
[im 109/218]
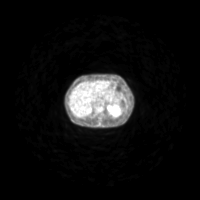
[im 163/218]
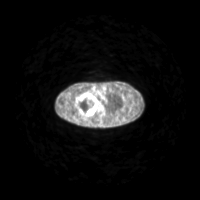
[im 218/218]
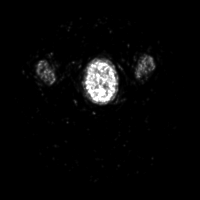

[Series 4: ct sk_thigh 5.0 b31f · axial · 5.0mm · 0.82mm/px · z∈[-1407,-539]mm · 5 of 218 slices shown]
[im 1/218]
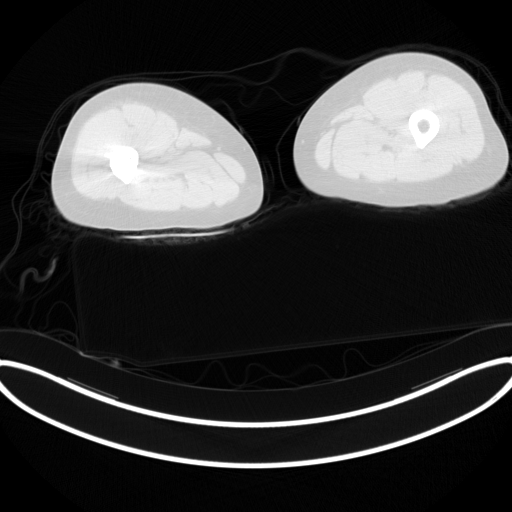
[im 55/218]
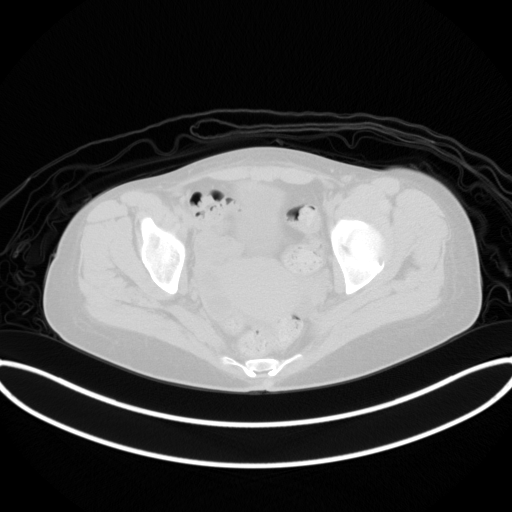
[im 109/218]
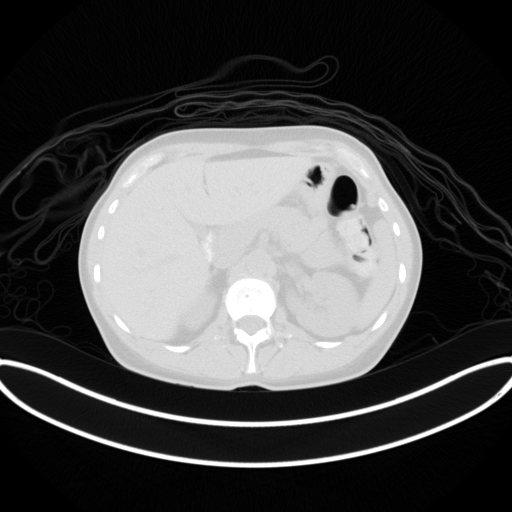
[im 163/218]
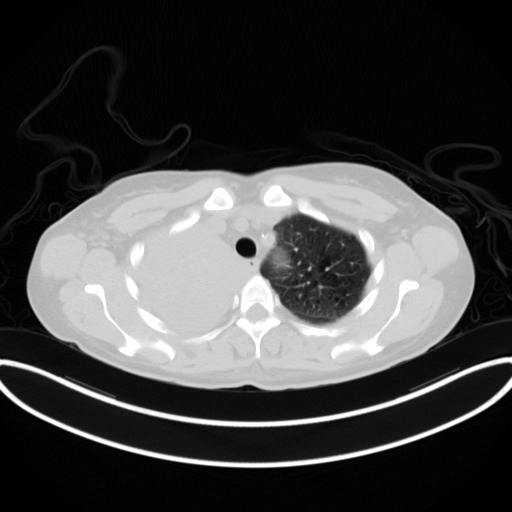
[im 218/218  brain]
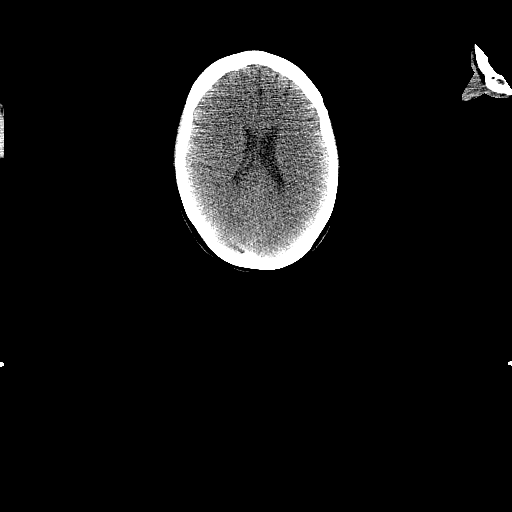

[Series 6: ct sk_thigh 5.0 b70f (id)_bone · axial · 5.0mm · 0.65mm/px · z∈[-937,-681]mm · 2 of 65 slices shown]
[im 1/65  bone]
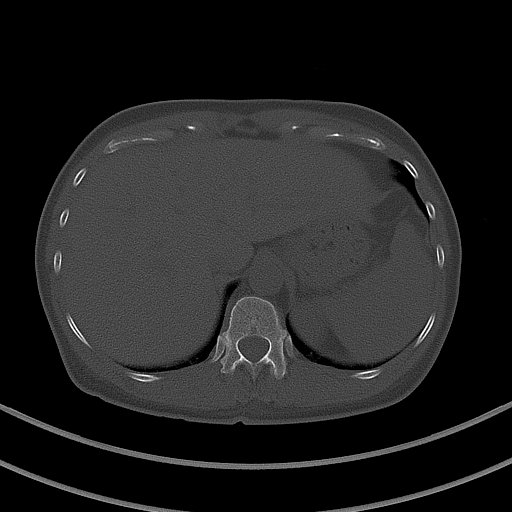
[im 65/65  bone]
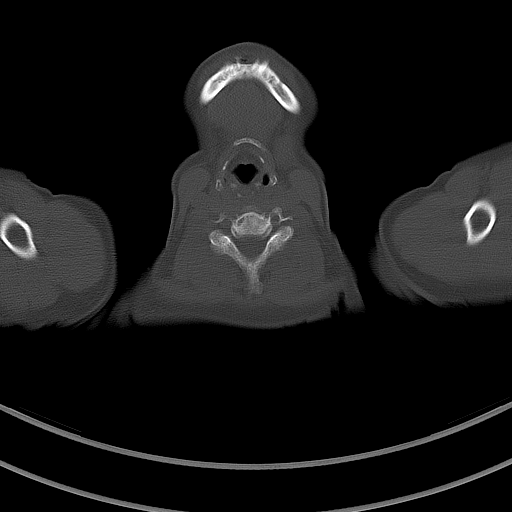

[Series 8: pet sk_thigh nac · axial · 5.0mm · 4.07mm/px · z∈[-1407,-539]mm · 5 of 218 slices shown]
[im 1/218]
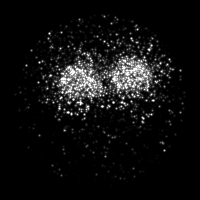
[im 55/218]
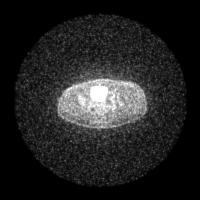
[im 109/218]
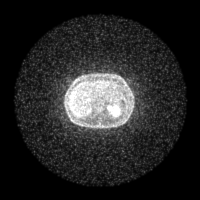
[im 163/218]
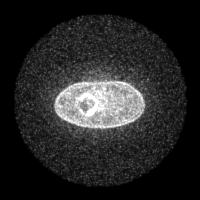
[im 218/218]
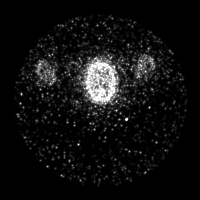

[Series 604: mip collection<mip range> · coronal · 1.80mm/px · 1 of 32 slices shown]
[im 1/32]
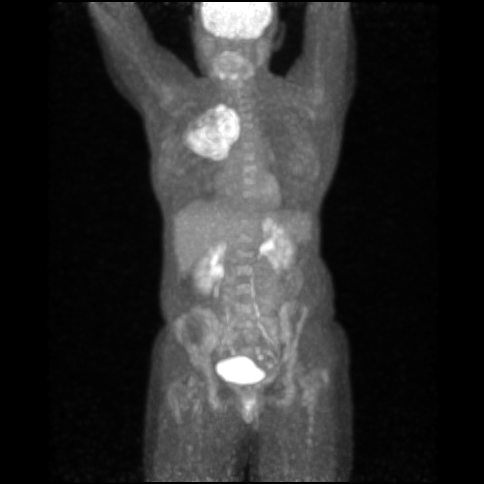

[Series 605: range-ct sk_thigh 5.0 (id)<alpha range> · 1 of 54 slices shown (1 of 2)]
[im 1/54]
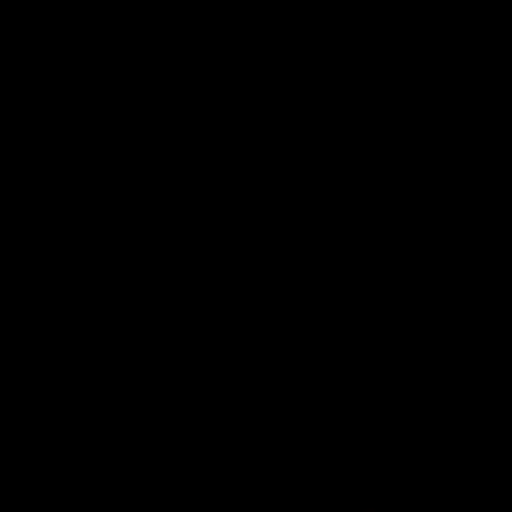

[Series 606: range-ct sk_thigh 5.0 (id)<alpha range> · 5 of 202 slices shown (2 of 2)]
[im 1/202]
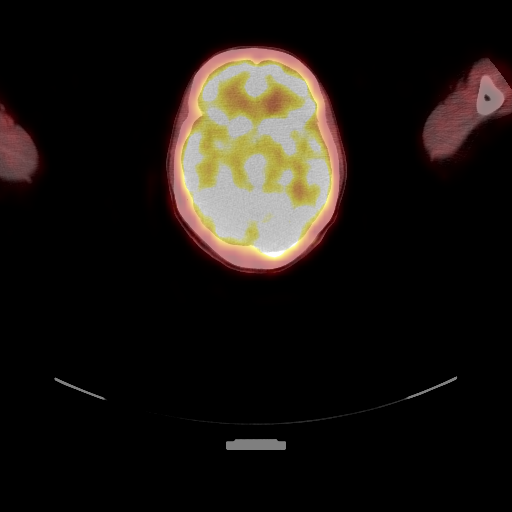
[im 51/202]
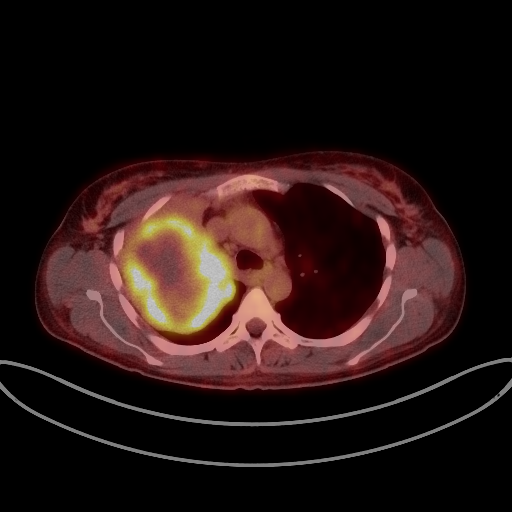
[im 101/202]
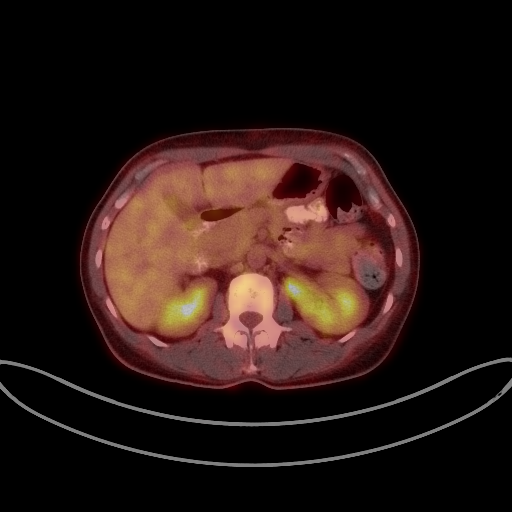
[im 151/202]
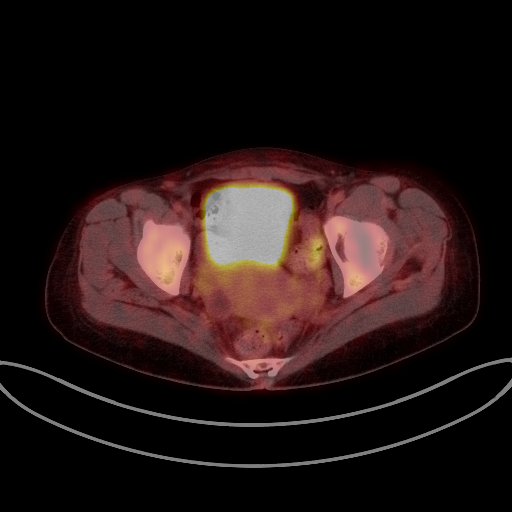
[im 202/202]
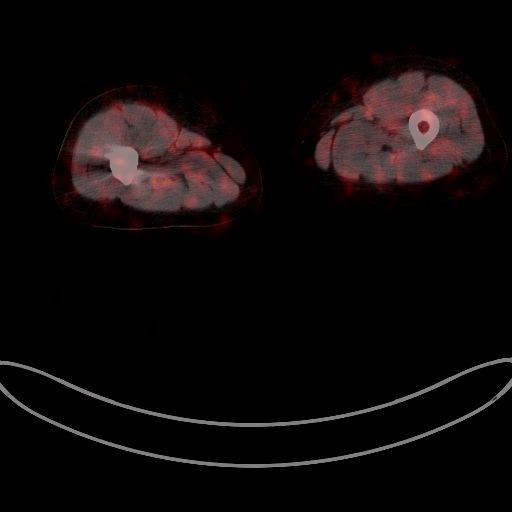

[Series 1032: results mm oncology reading · 0.74mm/px · 1 of 1 slices shown]
[im 1/1]
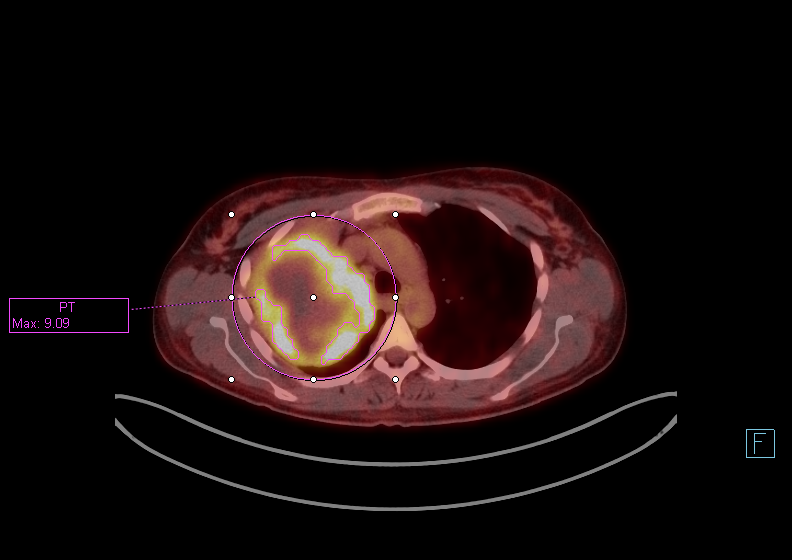

[25 of 25 positions shown; findings below may reference images not displayed]

FINDINGS: NECK

No hypermetabolic lymph nodes in the neck.

CHEST

OF THE RIGHT UPPER LOBE THERE IS A 10.0 X 10.2 CM ROUNDED MASS WITH
INTENSE PERIPHERAL METABOLIC ACTIVITY (SUV MAX 9.1) AND CENTRAL HYPO
METABOLISM CONSISTENT WITH NECROSIS.

Small subpleural nodule in the right lower lobe along the oblique
fissure (image 50) measures 4 mm and is likely benign.

There no hypermetabolic mediastinal lymph nodes. Hypermetabolic mass
does abut the pleural surface over a large area there is no evidence
of gross extension the chest wall.

ABDOMEN/PELVIS

No abnormal hypermetabolic activity within the liver, pancreas,
adrenal glands, or spleen. No hypermetabolic lymph nodes in the
abdomen or pelvis.

SKELETON

No focal hypermetabolic activity to suggest skeletal metastasis.
IMPRESSION: 1. Large hypermetabolic right upper lobe mass with central necrosis
consistent with primary carcinoma.
2. No evidence of mediastinal nodal metastasis or distant disease.
3. Staging by FDG PET imaging T3 N0 M0.

## 2016-09-14 IMAGING — MR MR HEAD WO/W CM
10 of 13 series · 35 of 48 positions shown · IV contrast (multihance)
Comparison: None available.

EXAM:
MRI HEAD WITHOUT AND WITH CONTRAST
TECHNIQUE: Multiplanar, multiecho pulse sequences of the brain and surrounding
structures were obtained without and with intravenous contrast.

CONTRAST:  12mL MULTIHANCE GADOBENATE DIMEGLUMINE 529 MG/ML IV SOLN

[Series 3: T1 · sagittal · 5.0mm · 0.47mm/px · 2 of 24 slices shown]
[im 1/24]
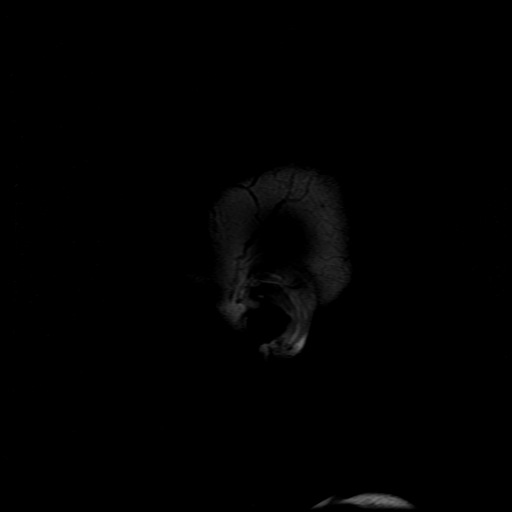
[im 12/24]
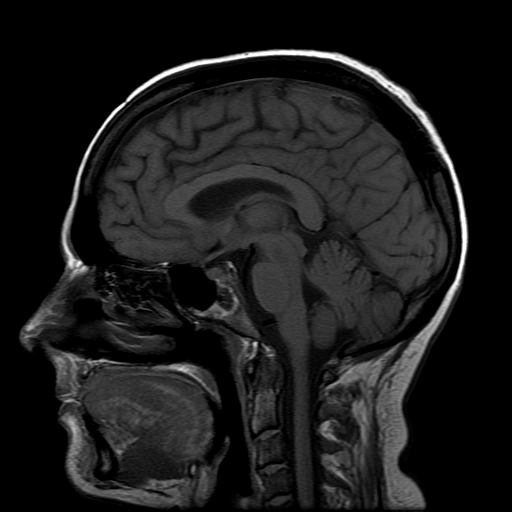

[Series 4: DWI · axial · 3.0mm · 1.09mm/px · z∈[-34,+101]mm · 9 of 92 slices shown (1 of 4)]
[im 1/92]
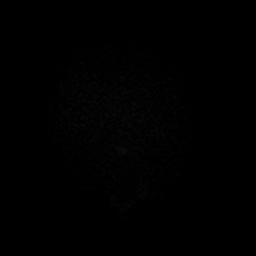
[im 12/92]
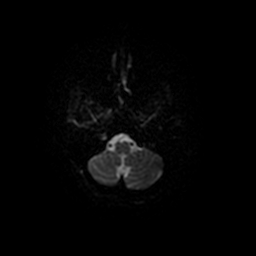
[im 23/92]
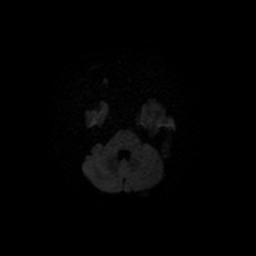
[im 35/92]
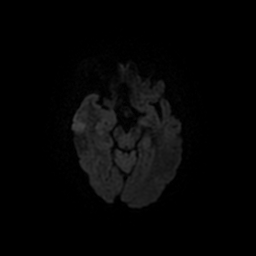
[im 46/92]
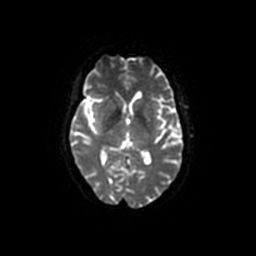
[im 57/92]
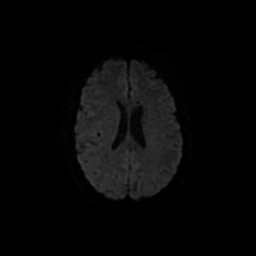
[im 69/92]
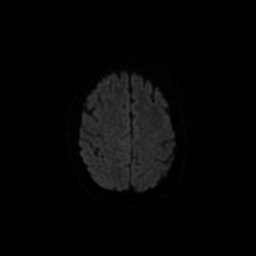
[im 80/92]
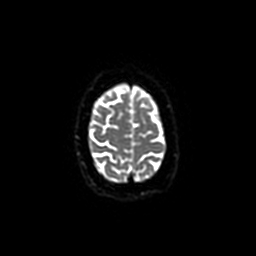
[im 92/92]
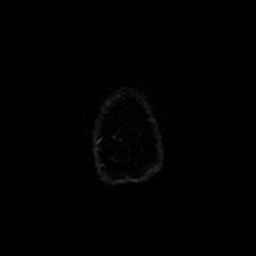

[Series 5: DWI · coronal · 5.0mm · 1.09mm/px · 6 of 72 slices shown (2 of 4)]
[im 1/72]
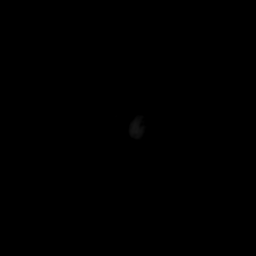
[im 15/72]
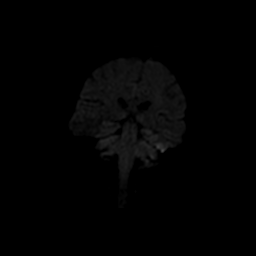
[im 29/72]
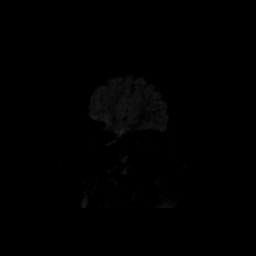
[im 43/72]
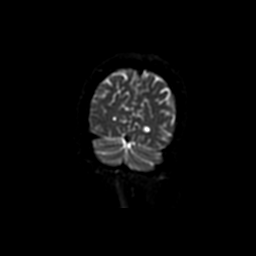
[im 57/72]
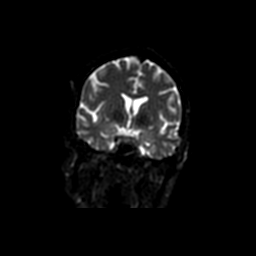
[im 72/72]
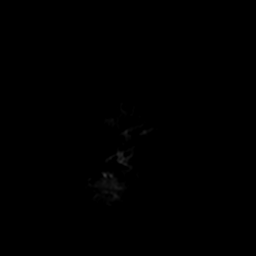

[Series 6: T2 · axial · 5.0mm · 0.43mm/px · z∈[-50,+99]mm · 2 of 24 slices shown]
[im 1/24]
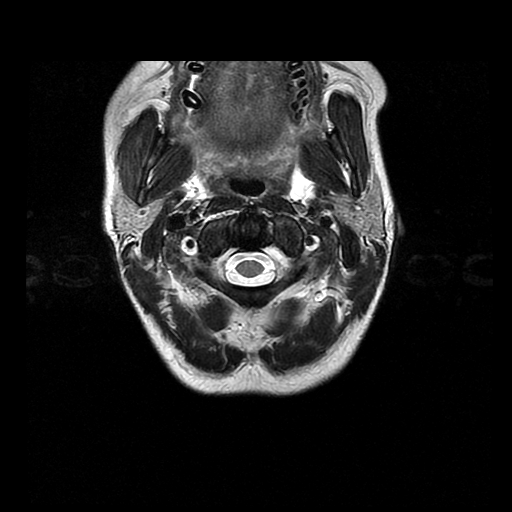
[im 24/24]
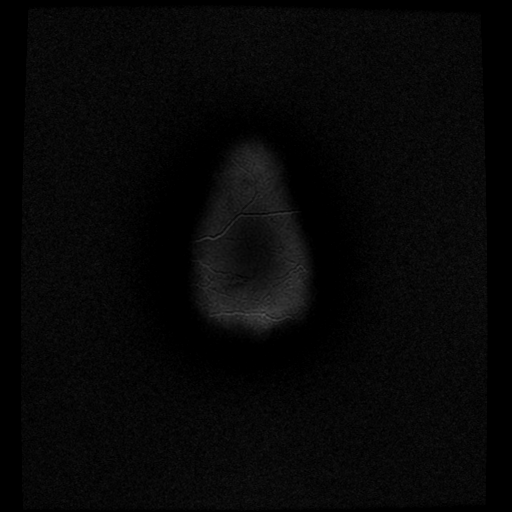

[Series 7: FLAIR · axial · 5.0mm · 0.43mm/px · z∈[-50,+99]mm · 2 of 24 slices shown]
[im 1/24]
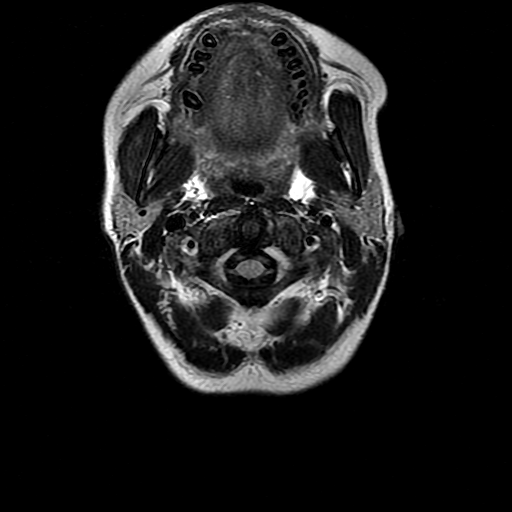
[im 24/24]
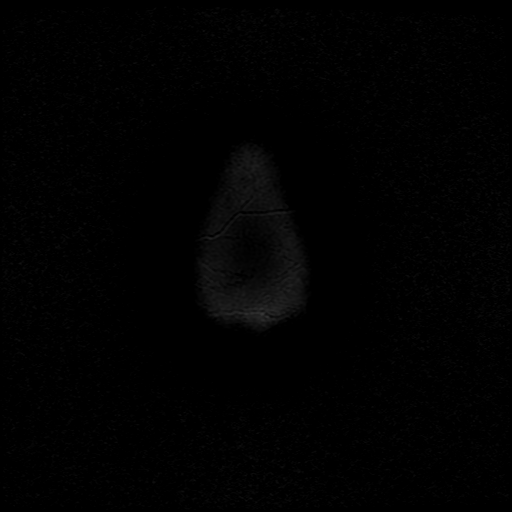

[Series 10: T2 post-contrast · sagittal · 5.0mm · 0.45mm/px · 2 of 20 slices shown]
[im 1/20]
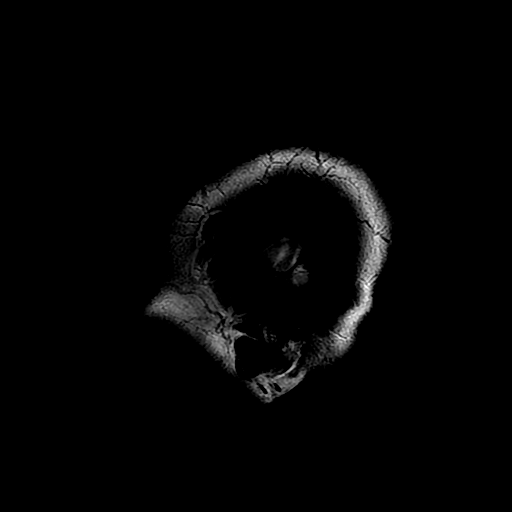
[im 20/20]
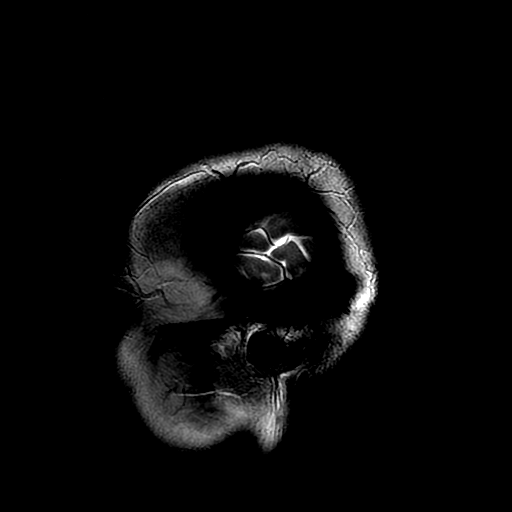

[Series 13: T1 post-contrast · coronal · 5.0mm · 0.45mm/px · 2 of 27 slices shown (1 of 2)]
[im 1/27]
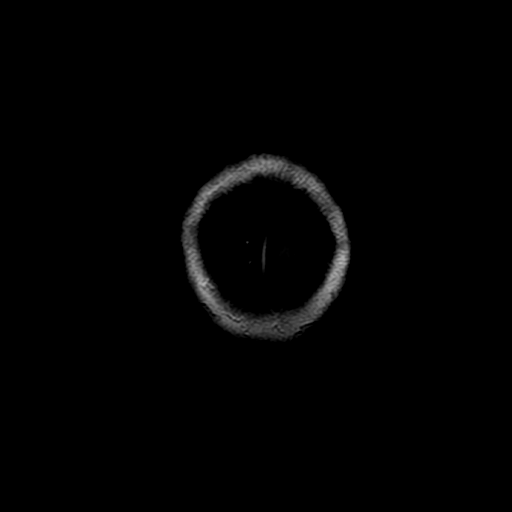
[im 27/27]
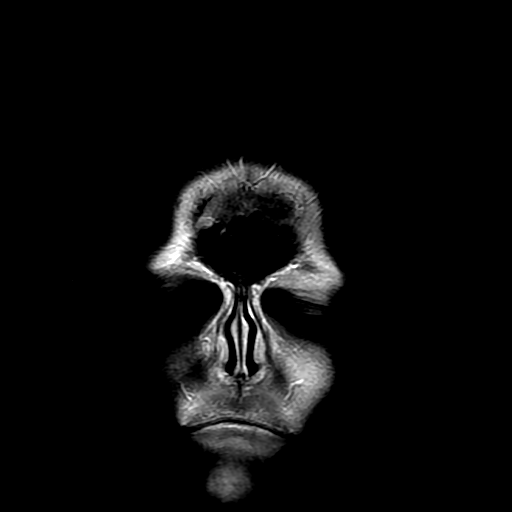

[Series 14: T1 post-contrast · sagittal · 5.0mm · 0.47mm/px · 2 of 24 slices shown (2 of 2)]
[im 1/24]
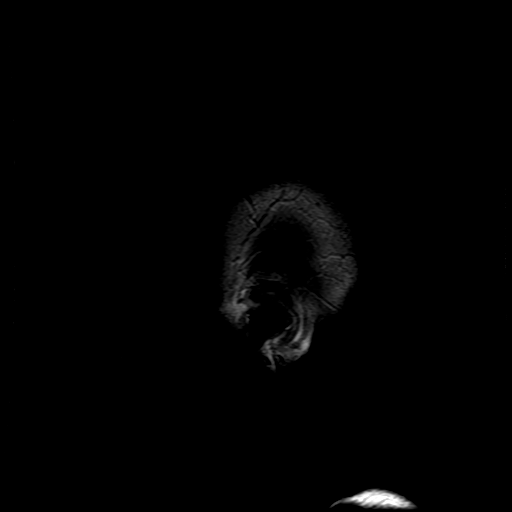
[im 24/24]
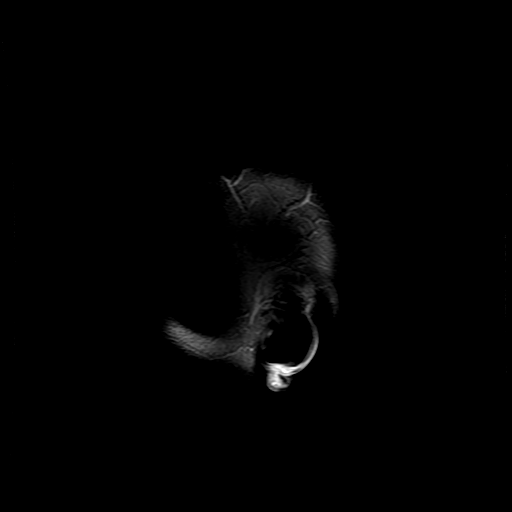

[Series 400: DWI · axial · 3.0mm · 1.09mm/px · z∈[-34,+101]mm · 4 of 46 slices shown (3 of 4)]
[im 1/46]
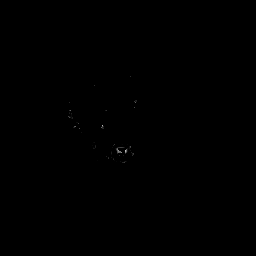
[im 16/46]
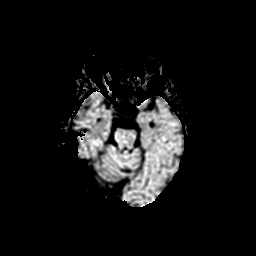
[im 31/46]
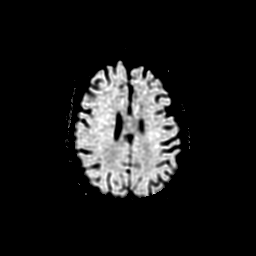
[im 46/46]
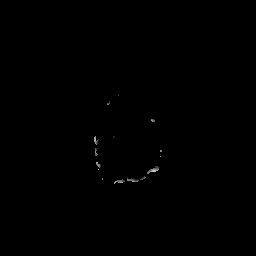

[Series 401: DWI · axial · 3.0mm · 1.09mm/px · z∈[-34,+101]mm · 4 of 46 slices shown (4 of 4)]
[im 1/46]
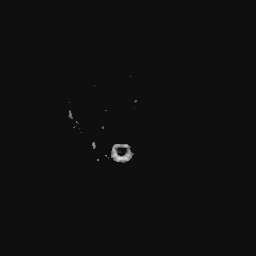
[im 16/46]
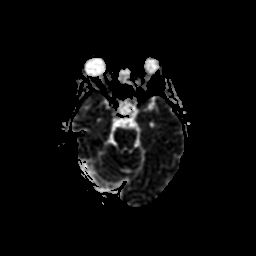
[im 31/46]
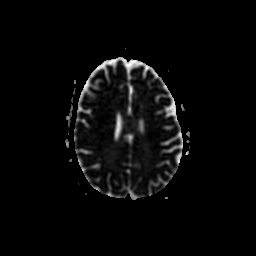
[im 46/46]
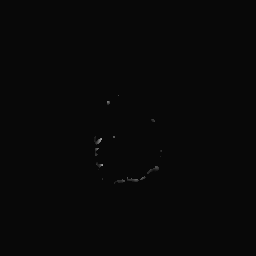

[35 of 48 positions shown; findings below may reference images not displayed]

FINDINGS: The CSF containing spaces are within normal limits for patient age.
A few scattered subcentimeter T2/FLAIR hyperintense foci noted
within the periventricular and deep white matter both cerebral
hemispheres, nonspecific, but of doubtful clinical significance.

No mass lesion, midline shift, or extra-axial fluid collection.
Ventricles are normal in size without evidence of hydrocephalus. No
abnormal enhancement on post-contrast sequences.

No diffusion-weighted signal abnormality is identified to suggest
acute intracranial infarct. Gray-white matter differentiation is
maintained. Normal flow voids are seen within the intracranial
vasculature. No intracranial hemorrhage identified.

The cervicomedullary junction is normal. Pituitary gland is within
normal limits. Pituitary stalk is midline. The globes and optic
nerves demonstrate a normal appearance with normal signal intensity.
The

The bone marrow signal intensity is normal. Calvarium is intact.
Visualized upper cervical spine is within normal limits.

Scalp soft tissues are unremarkable.

Paranasal sinuses are clear.  No mastoid effusion.
IMPRESSION: Normal brain MRI.  No evidence for intracranial metastasis.

## 2016-11-05 ENCOUNTER — Encounter: Payer: Self-pay | Admitting: *Deleted

## 2016-11-06 ENCOUNTER — Telehealth: Payer: Self-pay | Admitting: Emergency Medicine

## 2016-11-06 ENCOUNTER — Telehealth: Payer: Self-pay | Admitting: Internal Medicine

## 2016-11-06 NOTE — Telephone Encounter (Signed)
Scheduled appt per sch message from [facility] - patient is aware of appt date and time and reminder letter sent.

## 2016-11-06 NOTE — Telephone Encounter (Signed)
Patient's sister called to report that Barbara Frost has been experiencing severe fatigue, decreased appetite, states she's sleeping a lot and wheezing. States onset of these symptoms x one month. States that she gave her a breathing treatment and a prenatal vitamin. Requesting an appointment with Dr Julien Nordmann. Advised her that a message was sent to scheduling and they would be contacting her with his next available appointment.

## 2016-12-08 ENCOUNTER — Other Ambulatory Visit: Payer: Medicaid Other

## 2016-12-08 ENCOUNTER — Other Ambulatory Visit: Payer: Self-pay | Admitting: Medical Oncology

## 2016-12-08 ENCOUNTER — Ambulatory Visit: Payer: Medicaid Other | Admitting: Internal Medicine

## 2016-12-08 DIAGNOSIS — C3490 Malignant neoplasm of unspecified part of unspecified bronchus or lung: Secondary | ICD-10-CM

## 2016-12-16 ENCOUNTER — Telehealth: Payer: Self-pay | Admitting: *Deleted

## 2016-12-16 ENCOUNTER — Encounter: Payer: Self-pay | Admitting: *Deleted

## 2016-12-16 DIAGNOSIS — C3490 Malignant neoplasm of unspecified part of unspecified bronchus or lung: Secondary | ICD-10-CM

## 2016-12-16 NOTE — Telephone Encounter (Signed)
Oncology Nurse Navigator Documentation  Oncology Nurse Navigator Flowsheets 12/16/2016  Navigator Location [facility]  Navigator Encounter Type Other/Patient was a no show last week.  I followed up with Dr. Julien Nordmann. He would like to see Barbara Frost in the next few weeks with a CT scan before the appt.  I completed verbal orders and notified scheduling to call and arrange appt for her.  I also tried to call [name] but was unable to reach her.    Treatment Phase Follow-up  Barriers/Navigation Needs Coordination of Care  Interventions Coordination of Care

## 2017-05-28 ENCOUNTER — Telehealth: Payer: Self-pay | Admitting: *Deleted

## 2017-05-28 NOTE — Telephone Encounter (Signed)
Returned call to pt [phone], Man answered and said " This is not her Phone#. You need to remove it from your system"
